# Patient Record
Sex: Female | Born: 1950 | Race: White | Hispanic: No | Marital: Single | Smoking: Former smoker
Health system: Southern US, Community
[De-identification: ages and names within clinical notes are randomized; demographics above are authoritative.]

## PROBLEM LIST (undated history)

## (undated) DIAGNOSIS — J449 Chronic obstructive pulmonary disease, unspecified: Secondary | ICD-10-CM

## (undated) DIAGNOSIS — I10 Essential (primary) hypertension: Secondary | ICD-10-CM

## (undated) DIAGNOSIS — I2510 Atherosclerotic heart disease of native coronary artery without angina pectoris: Secondary | ICD-10-CM

## (undated) HISTORY — PX: CORONARY ANGIOPLASTY WITH STENT PLACEMENT: SHX49

---

## 2016-05-31 ENCOUNTER — Emergency Department (HOSPITAL_BASED_OUTPATIENT_CLINIC_OR_DEPARTMENT_OTHER)
Admission: EM | Admit: 2016-05-31 | Discharge: 2016-05-31 | Disposition: A | Discharge status: Other Acute Inpatient Hospital | Payer: Medicare Other | Attending: Emergency Medicine | Admitting: Emergency Medicine

## 2016-05-31 ENCOUNTER — Emergency Department (HOSPITAL_BASED_OUTPATIENT_CLINIC_OR_DEPARTMENT_OTHER): Payer: Medicare Other

## 2016-05-31 ENCOUNTER — Encounter (HOSPITAL_BASED_OUTPATIENT_CLINIC_OR_DEPARTMENT_OTHER): Payer: Self-pay | Admitting: Emergency Medicine

## 2016-05-31 DIAGNOSIS — Z79899 Other long term (current) drug therapy: Secondary | ICD-10-CM | POA: Insufficient documentation

## 2016-05-31 DIAGNOSIS — R0602 Shortness of breath: Secondary | ICD-10-CM | POA: Diagnosis present

## 2016-05-31 DIAGNOSIS — I2510 Atherosclerotic heart disease of native coronary artery without angina pectoris: Secondary | ICD-10-CM | POA: Diagnosis not present

## 2016-05-31 DIAGNOSIS — F17210 Nicotine dependence, cigarettes, uncomplicated: Secondary | ICD-10-CM | POA: Diagnosis not present

## 2016-05-31 DIAGNOSIS — J441 Chronic obstructive pulmonary disease with (acute) exacerbation: Secondary | ICD-10-CM

## 2016-05-31 DIAGNOSIS — I10 Essential (primary) hypertension: Secondary | ICD-10-CM | POA: Insufficient documentation

## 2016-05-31 HISTORY — DX: Chronic obstructive pulmonary disease, unspecified: J44.9

## 2016-05-31 HISTORY — DX: Essential (primary) hypertension: I10

## 2016-05-31 HISTORY — DX: Atherosclerotic heart disease of native coronary artery without angina pectoris: I25.10

## 2016-05-31 LAB — BASIC METABOLIC PANEL
ANION GAP: 9 (ref 5–15)
BUN: 19 mg/dL (ref 6–20)
CALCIUM: 9.6 mg/dL (ref 8.9–10.3)
CO2: 27 mmol/L (ref 22–32)
Chloride: 103 mmol/L (ref 101–111)
Creatinine, Ser: 0.68 mg/dL (ref 0.44–1.00)
Glucose, Bld: 132 mg/dL — ABNORMAL HIGH (ref 65–99)
POTASSIUM: 3.6 mmol/L (ref 3.5–5.1)
SODIUM: 139 mmol/L (ref 135–145)

## 2016-05-31 LAB — CBC WITH DIFFERENTIAL/PLATELET
Basophils Absolute: 0 10*3/uL (ref 0.0–0.1)
Basophils Relative: 0 %
Eosinophils Absolute: 0.1 10*3/uL (ref 0.0–0.7)
Eosinophils Relative: 1 %
HCT: 46.7 % — ABNORMAL HIGH (ref 36.0–46.0)
Hemoglobin: 16.1 g/dL — ABNORMAL HIGH (ref 12.0–15.0)
Lymphocytes Relative: 20 %
Lymphs Abs: 2 10*3/uL (ref 0.7–4.0)
MCH: 30.8 pg (ref 26.0–34.0)
MCHC: 34.5 g/dL (ref 30.0–36.0)
MCV: 89.3 fL (ref 78.0–100.0)
Monocytes Absolute: 0.7 10*3/uL (ref 0.1–1.0)
Monocytes Relative: 7 %
Neutro Abs: 7.3 10*3/uL (ref 1.7–7.7)
Neutrophils Relative %: 72 %
Platelets: 262 10*3/uL (ref 150–400)
RBC: 5.23 MIL/uL — ABNORMAL HIGH (ref 3.87–5.11)
RDW: 12.7 % (ref 11.5–15.5)
WBC: 10 10*3/uL (ref 4.0–10.5)

## 2016-05-31 MED ORDER — ALBUTEROL (5 MG/ML) CONTINUOUS INHALATION SOLN
10.0000 mg/h | INHALATION_SOLUTION | RESPIRATORY_TRACT | Status: DC
  Administered 2016-05-31: 10 mg/h via RESPIRATORY_TRACT
  Filled 2016-05-31: qty 20

## 2016-05-31 MED ORDER — METHYLPREDNISOLONE SODIUM SUCC 125 MG IJ SOLR
125.0000 mg | Freq: Once | INTRAMUSCULAR | Status: AC
  Administered 2016-05-31: 125 mg via INTRAVENOUS
  Filled 2016-05-31: qty 2

## 2016-05-31 MED ORDER — ALBUTEROL SULFATE (2.5 MG/3ML) 0.083% IN NEBU
INHALATION_SOLUTION | RESPIRATORY_TRACT | Status: AC
  Administered 2016-05-31: 5 mg
  Filled 2016-05-31: qty 6

## 2016-05-31 MED ORDER — IPRATROPIUM-ALBUTEROL 0.5-2.5 (3) MG/3ML IN SOLN
3.0000 mL | Freq: Four times a day (QID) | RESPIRATORY_TRACT | Status: DC
  Administered 2016-05-31: 3 mL via RESPIRATORY_TRACT
  Filled 2016-05-31: qty 3

## 2016-05-31 NOTE — ED Notes (Signed)
One hour breathing tx per resp

## 2016-05-31 NOTE — ED Provider Notes (Signed)
[facility]   CSN: [id number] Arrival date & time: 05/31/16  1408   By signing my name below, I, Clarisse GougeXavier Herndon, attest that this documentation has been prepared under the direction and in the presence of Raeford RazorKohut, Tiki Tucciarone, MD. Electronically signed, Clarisse GougeXavier Herndon, ED Scribe. 05/31/16. 3:24 PM.   History   Chief Complaint Chief Complaint  Patient presents with  . Shortness of Breath   The history is provided by the patient and medical records. No language interpreter was used.  Shortness of Breath  Pertinent negatives include no fever, no cough, no chest pain, no syncope and no leg swelling. It is unknown what precipitated the problem. Treatments tried: albuterol. The treatment provided no relief. Associated medical issues include COPD.    Kristy Weeks is a 66 y.o. female with h/o COPD, CAD and HTN, who presents to the Emergency Department with concern for gradually worsening SOB x 4 days. Feels achy all over. Feels somewhat anxious. Pt diagnosed with COPD ~09/2015. States she has used multiple medications including albuterol without relief. Pt last prescribed prednisone 09/2015. No cough, foot swelling, fever or any other complaints noted at this time.  Past Medical History:  Diagnosis Date  . COPD (chronic obstructive pulmonary disease) (HCC)   . Coronary artery disease   . Hypertension     There are no active problems to display for this patient.   Past Surgical History:  Procedure Laterality Date  . CORONARY ANGIOPLASTY WITH STENT PLACEMENT      OB History    No data available       Home Medications    Prior to Admission medications   Medication Sig Start Date End Date Taking? Authorizing Provider  albuterol (ACCUNEB) 0.63 MG/3ML nebulizer solution Take 1 ampule by nebulization every 6 (six) hours as needed for wheezing.   Yes [provider]  lisinopril (PRINIVIL,ZESTRIL) 10 MG tablet Take 10 mg by mouth 2 (two) times daily.   Yes  [provider]  tiotropium (SPIRIVA) 18 MCG inhalation capsule Place 18 mcg into inhaler and inhale daily.   Yes [provider]    Family History No family history on file.  Social History Social History  Substance Use Topics  . Smoking status: Current Every Day Smoker    Packs/day: 0.50    Types: Cigarettes  . Smokeless tobacco: Never Used  . Alcohol use No     Allergies   Patient has no known allergies.   Review of Systems Review of Systems  Constitutional: Negative for fever.  Respiratory: Positive for chest tightness and shortness of breath. Negative for cough.   Cardiovascular: Negative for chest pain, leg swelling and syncope.  Musculoskeletal: Positive for arthralgias and myalgias.  Psychiatric/Behavioral: The patient is nervous/anxious.   All other systems reviewed and are negative.    Physical Exam Updated Vital Signs BP (!) 136/102 (BP Location: Left Arm)   Pulse 98   Temp 97.5 F (36.4 C) (Oral)   Resp (!) 26   SpO2 92%   Physical Exam  Constitutional: She is oriented to person, place, and time. She appears well-developed and well-nourished. No distress.  HENT:  Head: Normocephalic and atraumatic.  Eyes: EOM are normal.  Neck: Normal range of motion.  Cardiovascular: Regular rhythm and normal heart sounds.  Tachycardia present.   Pulmonary/Chest: Effort normal. Tachypnea noted. She has wheezes.  Abdominal: Soft. She exhibits no distension. There is no tenderness.  Musculoskeletal: Normal range of motion.  No lower extremity edema  Neurological: She is alert and oriented to person, place, and time.  Skin: Skin is warm and dry.  Psychiatric: She has a normal mood and affect. Judgment normal.  Nursing note and vitals reviewed.    ED Treatments / Results  DIAGNOSTIC STUDIES: Oxygen Saturation is 92% on NRB, low by my interpretation.    COORDINATION OF CARE: 3:13 PM-Discussed next steps with pt. Pt verbalized understanding and  is agreeable with the plan. Will order IV medications, review records and reassess.   Labs (all labs ordered are listed, but only abnormal results are displayed) Labs Reviewed  CBC WITH DIFFERENTIAL/PLATELET - Abnormal; Notable for the following:       Result Value   RBC 5.23 (*)    Hemoglobin 16.1 (*)    HCT 46.7 (*)    All other components within normal limits  BASIC METABOLIC PANEL    EKG  EKG Interpretation None       Radiology Dg Chest 2 View  Result Date: 05/31/2016 CLINICAL DATA:  COPD exacerbation.  Shortness of breath. EXAM: CHEST  2 VIEW COMPARISON:  10/18/2015 FINDINGS: The heart size and mediastinal contours are within normal limits. Lungs are hyperinflated. Advanced changes of emphysema identified. Chronic mass-like architectural distortion within the lateral right apex is similar to previous exam. Likely scar. The visualized skeletal structures are unremarkable. IMPRESSION: 1. No acute cardiopulmonary abnormalities. 2.  Emphysema (ICD10-J43.9). Electronically Signed   By: Signa Kell M.D.   On: 05/31/2016 15:11    Procedures Procedures (including critical care time)  Medications Ordered in ED Medications  albuterol (PROVENTIL,VENTOLIN) solution continuous neb (10 mg/hr Nebulization New Bag/Given 05/31/16 1503)  albuterol (PROVENTIL) (2.5 MG/3ML) 0.083% nebulizer solution (5 mg  Given 05/31/16 1426)  methylPREDNISolone sodium succinate (SOLU-MEDROL) 125 mg/2 mL injection 125 mg (125 mg Intravenous Given 05/31/16 1608)     Initial Impression / Assessment and Plan / ED Course  I have reviewed the triage vital signs and the nursing notes.  Pertinent labs & imaging results that were available during my care of the patient were reviewed by me and considered in my medical decision making (see chart for details).  65yF with dyspnea. Clinically COPD exacerbation. Hx of the same. Wheezing on exam. No orthopnea. Clinically euvolemic. Denies CP. No signs/symptoms of DVT/PE.  CXR w/o focal abnormality. Afebrile. No leukocytosis.   Given 125mg  solumderol. Albuterol/atrovent including hour long neb. Still with RR 26-28. Requiring 3L via [place] to keep o2 sats in low 90s. No baseline oxygen requirement. Will admit. Pt requesting admission at [facility]. Discussed with Dr Georgina Snell,  hospitalist service.   Final Clinical Impressions(s) / ED Diagnoses   Final diagnoses:  COPD exacerbation (HCC)    New Prescriptions New Prescriptions   No medications on file    I personally preformed the services scribed in my presence. The recorded information has been reviewed is accurate. Raeford Razor, MD.    Raeford Razor, MD 05/31/16 [phone]

## 2016-05-31 NOTE — ED Notes (Signed)
[facility] Hospitalist paged @ [phone].

## 2016-05-31 NOTE — ED Notes (Signed)
Patient seen in triage and given 5 mg Albuterol. When she arrived to room, still very SOB and sitting straight up to breathe. Spoke with MD about starting a 10mg  CAT. I did not give atrovent because she had spiriva this AM. RT to monitor as needed.

## 2016-05-31 NOTE — ED Notes (Signed)
No changes, preparing to leave with [facility], alert, NAD, calm, breathing easier.

## 2016-05-31 NOTE — ED Notes (Signed)
Up to b/r, tolerating, steady gait.

## 2016-05-31 NOTE — ED Triage Notes (Signed)
SOB with cough since Tuesday

## 2016-05-31 NOTE — ED Notes (Signed)
CAT done at this time. SAT 88% on RA so I placed on 3 LNC. RN at bedside starting IV

## 2016-05-31 NOTE — ED Notes (Signed)
Alert, NAD, calm, interactive, increased wob noted, speaking in clear short phrases, skin W&D, VSS, mentions some sob, "feel better", a little light-headed, (denies: pain, nausea, dizziness or visual changes), pending transport arrival.

## 2016-05-31 NOTE — ED Notes (Signed)
Pt going to [facility].  Admitting Dr Renford DillsAdhikari.  [facility] notified and stated that they would be here after shift change.  [facility] not available. 1824

## 2016-05-31 NOTE — ED Notes (Signed)
insp and exp wheezies

## 2017-02-20 ENCOUNTER — Other Ambulatory Visit: Payer: Self-pay

## 2017-02-20 ENCOUNTER — Encounter (HOSPITAL_BASED_OUTPATIENT_CLINIC_OR_DEPARTMENT_OTHER): Payer: Self-pay | Admitting: Adult Health

## 2017-02-20 ENCOUNTER — Emergency Department (HOSPITAL_BASED_OUTPATIENT_CLINIC_OR_DEPARTMENT_OTHER): Payer: Medicare Other

## 2017-02-20 ENCOUNTER — Emergency Department (HOSPITAL_BASED_OUTPATIENT_CLINIC_OR_DEPARTMENT_OTHER)
Admission: EM | Admit: 2017-02-20 | Discharge: 2017-02-20 | Disposition: A | Discharge status: Home / Self Care | Payer: Medicare Other | Attending: Emergency Medicine | Admitting: Emergency Medicine

## 2017-02-20 DIAGNOSIS — F17210 Nicotine dependence, cigarettes, uncomplicated: Secondary | ICD-10-CM | POA: Diagnosis not present

## 2017-02-20 DIAGNOSIS — Z79899 Other long term (current) drug therapy: Secondary | ICD-10-CM | POA: Insufficient documentation

## 2017-02-20 DIAGNOSIS — I10 Essential (primary) hypertension: Secondary | ICD-10-CM | POA: Insufficient documentation

## 2017-02-20 DIAGNOSIS — J441 Chronic obstructive pulmonary disease with (acute) exacerbation: Secondary | ICD-10-CM | POA: Diagnosis not present

## 2017-02-20 DIAGNOSIS — R0602 Shortness of breath: Secondary | ICD-10-CM | POA: Diagnosis present

## 2017-02-20 DIAGNOSIS — I2510 Atherosclerotic heart disease of native coronary artery without angina pectoris: Secondary | ICD-10-CM | POA: Diagnosis not present

## 2017-02-20 LAB — BASIC METABOLIC PANEL
Anion gap: 9 (ref 5–15)
BUN: 12 mg/dL (ref 6–20)
CALCIUM: 9.4 mg/dL (ref 8.9–10.3)
CHLORIDE: 102 mmol/L (ref 101–111)
CO2: 26 mmol/L (ref 22–32)
CREATININE: 0.93 mg/dL (ref 0.44–1.00)
GFR calc non Af Amer: >60 mL/min (ref >60)
Glucose, Bld: 121 mg/dL — ABNORMAL HIGH (ref 65–99)
Potassium: 4.3 mmol/L (ref 3.5–5.1)
SODIUM: 137 mmol/L (ref 135–145)

## 2017-02-20 LAB — CBC
HCT: 40.5 % (ref 36.0–46.0)
Hemoglobin: 13.8 g/dL (ref 12.0–15.0)
MCH: 31.4 pg (ref 26.0–34.0)
MCHC: 34.1 g/dL (ref 30.0–36.0)
MCV: 92.3 fL (ref 78.0–100.0)
PLATELETS: 247 10*3/uL (ref 150–400)
RBC: 4.39 MIL/uL (ref 3.87–5.11)
RDW: 12.4 % (ref 11.5–15.5)
WBC: 9.3 10*3/uL (ref 4.0–10.5)

## 2017-02-20 LAB — BRAIN NATRIURETIC PEPTIDE: B NATRIURETIC PEPTIDE 5: 72.4 pg/mL (ref 0.0–100.0)

## 2017-02-20 LAB — TROPONIN I

## 2017-02-20 MED ORDER — IPRATROPIUM-ALBUTEROL 0.5-2.5 (3) MG/3ML IN SOLN
3.0000 mL | Freq: Once | RESPIRATORY_TRACT | Status: AC
  Administered 2017-02-20: 3 mL via RESPIRATORY_TRACT
  Filled 2017-02-20: qty 3

## 2017-02-20 MED ORDER — ALBUTEROL SULFATE (2.5 MG/3ML) 0.083% IN NEBU
2.5000 mg | INHALATION_SOLUTION | Freq: Once | RESPIRATORY_TRACT | Status: AC
  Administered 2017-02-20: 2.5 mg via RESPIRATORY_TRACT
  Filled 2017-02-20: qty 3

## 2017-02-20 MED ORDER — IPRATROPIUM-ALBUTEROL 0.5-2.5 (3) MG/3ML IN SOLN
3.0000 mL | Freq: Once | RESPIRATORY_TRACT | Status: AC
  Administered 2017-02-20: 3 mL via RESPIRATORY_TRACT

## 2017-02-20 MED ORDER — ALBUTEROL SULFATE (2.5 MG/3ML) 0.083% IN NEBU
INHALATION_SOLUTION | RESPIRATORY_TRACT | Status: AC
  Administered 2017-02-20: 2.5 mg via RESPIRATORY_TRACT
  Filled 2017-02-20: qty 3

## 2017-02-20 MED ORDER — ALBUTEROL SULFATE (2.5 MG/3ML) 0.083% IN NEBU
2.5000 mg | INHALATION_SOLUTION | Freq: Once | RESPIRATORY_TRACT | Status: AC
  Administered 2017-02-20: 2.5 mg via RESPIRATORY_TRACT

## 2017-02-20 MED ORDER — PREDNISONE 50 MG PO TABS
60.0000 mg | ORAL_TABLET | Freq: Once | ORAL | Status: AC
  Administered 2017-02-20: 16:00:00 60 mg via ORAL
  Filled 2017-02-20: qty 1

## 2017-02-20 MED ORDER — IPRATROPIUM-ALBUTEROL 0.5-2.5 (3) MG/3ML IN SOLN
RESPIRATORY_TRACT | Status: AC
  Administered 2017-02-20: 3 mL via RESPIRATORY_TRACT
  Filled 2017-02-20: qty 3

## 2017-02-20 MED ORDER — PREDNISONE 20 MG PO TABS: 40.0000 mg | ORAL_TABLET | Freq: Every day | ORAL | 0 refills | Status: DC

## 2017-02-20 NOTE — ED Notes (Signed)
Pt discharged to home with family. NAD. Steady gait . Pt wearing 2 L [place] from portable home tank.

## 2017-02-20 NOTE — ED Notes (Signed)
RRT Brett CanalesSteve ambulated Pt

## 2017-02-20 NOTE — Discharge Instructions (Signed)
Please take prednisone for the next 5 days Continue breathing treatments Return if worsening

## 2017-02-20 NOTE — ED Notes (Signed)
Up to restroom, SpO2 87% on 2l/m [place], purse lip breathing noted. RR 26

## 2017-02-20 NOTE — ED Notes (Signed)
Ambulated on 2l/m , HR 90-97, RR 20-26, purse lip breathing, +DOE, SpO2 85% at lowest.  Returned to room SpO2 recovered to 92% on 2l/m.  BBS distant.

## 2017-02-20 NOTE — ED Triage Notes (Signed)
Presents with SOB, lower back pain, fatigue and chest tightness. She is O2 dependent at 2 liters, but had to turmn it up to 4 liters. She completed Pulmonary  Rehab one week ago.  Bila;teral inspiratory and expiratory audible wheezes/

## 2017-02-20 NOTE — ED Provider Notes (Signed)
[facility] EMERGENCY DEPARTMENT Provider Note   CSN: [id number] Arrival date & time: 02/20/17  1518     History   Chief Complaint Chief Complaint  Patient presents with  . Shortness of Breath    HPI Kristy Weeks is a 67 y.o. female who presents with SOB and wheezing. PMH significant for COPD on 2L of O2 24/7, CAD, HTN, current smoker. She states that she recently completed pulmonary rehab. She did very well and graduated. Over the weekend she was able to go shopping with her sister without any difficulty. Over the last couple of days she has had gradually worsening SOB with exertion. She also reports low back pain and attributes this to her increased WOB vs osteopenia. She denies injury to the back. She has been using 4L of O2 because she's been SOB. She denies fever, URI symptoms, cough, chest pain, diaphoresis, abdominal pain, nausea or vomiting, leg swelling. She has continued to smoke several cigarettes daily.  HPI  Past Medical History:  Diagnosis Date  . COPD (chronic obstructive pulmonary disease) (HCC)   . Coronary artery disease   . Hypertension     There are no active problems to display for this patient.   Past Surgical History:  Procedure Laterality Date  . CORONARY ANGIOPLASTY WITH STENT PLACEMENT      OB History    No data available       Home Medications    Prior to Admission medications   Medication Sig Start Date End Date Taking? Authorizing Provider  albuterol (ACCUNEB) 0.63 MG/3ML nebulizer solution Take 1 ampule by nebulization every 6 (six) hours as needed for wheezing.    [provider]  lisinopril (PRINIVIL,ZESTRIL) 10 MG tablet Take 10 mg by mouth 2 (two) times daily.    [provider]  tiotropium (SPIRIVA) 18 MCG inhalation capsule Place 18 mcg into inhaler and inhale daily.    [provider]    Family History History reviewed. No pertinent family history.  Social History Social History    Tobacco Use  . Smoking status: Current Every Day Smoker    Packs/day: 0.50    Types: Cigarettes  . Smokeless tobacco: Never Used  Substance Use Topics  . Alcohol use: No  . Drug use: No     Allergies   Patient has no known allergies.   Review of Systems Review of Systems  Constitutional: Negative for chills and fever.  HENT: Negative for congestion and rhinorrhea.   Respiratory: Positive for chest tightness, shortness of breath and wheezing. Negative for cough.   Cardiovascular: Negative for chest pain and leg swelling.  Gastrointestinal: Negative for abdominal pain, nausea and vomiting.  Musculoskeletal: Positive for back pain.  Neurological: Negative for weakness.  All other systems reviewed and are negative.    Physical Exam Updated Vital Signs BP 114/71   Pulse 85   Temp 98.1 F (36.7 C) (Oral)   Resp 18   SpO2 93%   Physical Exam  Constitutional: She is oriented to person, place, and time. She appears well-developed and well-nourished. No distress.  Chronically ill appearing  HENT:  Head: Normocephalic and atraumatic.  Eyes: Conjunctivae are normal. Pupils are equal, round, and reactive to light. Right eye exhibits no discharge. Left eye exhibits no discharge. No scleral icterus.  Neck: Normal range of motion.  Cardiovascular: Normal rate and regular rhythm. Exam reveals no gallop and no friction rub.  No murmur heard. Pulmonary/Chest: Effort normal. No stridor. No respiratory distress. She has  wheezes (faint expiratory wheezes). She has no rales. She exhibits no tenderness.  Abdominal: Soft. Bowel sounds are normal. She exhibits no distension. There is no tenderness.  Musculoskeletal:  No leg swelling  Neurological: She is alert and oriented to person, place, and time.  Skin: Skin is warm and dry.  Psychiatric: She has a normal mood and affect. Her behavior is normal.  Nursing note and vitals reviewed.    ED Treatments / Results  Labs (all labs  ordered are listed, but only abnormal results are displayed) Labs Reviewed  BASIC METABOLIC PANEL - Abnormal; Notable for the following components:      Result Value   Glucose, Bld 121 (*)    All other components within normal limits  CBC  TROPONIN I  BRAIN NATRIURETIC PEPTIDE    EKG  EKG Interpretation  Date/Time:  Friday February 20 2017 15:27:51 EST Ventricular Rate:  91 PR Interval:  154 QRS Duration: 96 QT Interval:  382 QTC Calculation: 469 R Axis:   78 Text Interpretation:  Sinus rhythm with Premature atrial complexes Cannot rule out Anterior infarct , age undetermined Marked ST abnormality, possible inferior subendocardial injury Abnormal ECG agree. no STEMI. no old comparison Confirmed by Arby Barrette [phone]) on 02/20/2017 3:46:49 PM       Radiology Dg Chest 2 View  Result Date: 02/20/2017 CLINICAL DATA:  Chest tightness, weakness EXAM: CHEST  2 VIEW COMPARISON:  05/31/2016 FINDINGS: The lungs are hyperinflated likely secondary to COPD. There is right apical scarring. There is no focal parenchymal opacity. There is no pleural effusion or pneumothorax. The heart and mediastinal contours are unremarkable. The osseous structures are unremarkable. IMPRESSION: No active cardiopulmonary disease. Electronically Signed   By: Elige Ko   On: 02/20/2017 16:04    Procedures Procedures (including critical care time)  Medications Ordered in ED Medications  predniSONE (DELTASONE) tablet 60 mg (60 mg Oral Given 02/20/17 1626)  ipratropium-albuterol (DUONEB) 0.5-2.5 (3) MG/3ML nebulizer solution 3 mL (3 mLs Nebulization Given 02/20/17 1742)  albuterol (PROVENTIL) (2.5 MG/3ML) 0.083% nebulizer solution 2.5 mg (2.5 mg Nebulization Given 02/20/17 1742)  albuterol (PROVENTIL) (2.5 MG/3ML) 0.083% nebulizer solution 2.5 mg (2.5 mg Nebulization Given 02/20/17 1808)  ipratropium-albuterol (DUONEB) 0.5-2.5 (3) MG/3ML nebulizer solution 3 mL (3 mLs Nebulization Given 02/20/17 1808)  albuterol  (PROVENTIL) (2.5 MG/3ML) 0.083% nebulizer solution 2.5 mg (2.5 mg Nebulization Not Given 02/20/17 1901)  ipratropium-albuterol (DUONEB) 0.5-2.5 (3) MG/3ML nebulizer solution 3 mL (3 mLs Nebulization Not Given 02/20/17 1901)     Initial Impression / Assessment and Plan / ED Course  I have reviewed the triage vital signs and the nursing notes.  Pertinent labs & imaging results that were available during my care of the patient were reviewed by me and considered in my medical decision making (see chart for details).  67 year old with COPD exacerbation.  Likely due to her ongoing smoking.  She is mildly tachypneic on exam however her lungs sound clear.  O2 sat are normal on 2 L.  Blood work is overall unremarkable.  EKG is normal sinus rhythm.  Chest x-ray shows chronic changes without acute pathology.  She was given multiple breathing treatments and steroids and felt somewhat better.  She was ambulated in the hall on her O2 sats dropped to 85%.  She had visit with Dr. Clarice Pole.  I shared decision making was made with the patient.  Admission was offered however she declined stating she wants to go home and will do treatments at home.  Will prescribe a steroid burst for the next couple of days and advised to return if worsening.  She verbalized understanding.  Final Clinical Impressions(s) / ED Diagnoses   Final diagnoses:  COPD exacerbation [facility](HCC)    ED Discharge Orders    None       Bethel BornGekas, Wally Behan Marie, PA-C 02/21/17 [phone]    Arby BarrettePfeiffer, Marcy, MD 02/24/17 1759

## 2017-02-20 NOTE — ED Provider Notes (Signed)
Medical screening examination/treatment/procedure(s) were conducted as a shared visit with non-physician practitioner(s) and myself.  I personally evaluated the patient during the encounter.   EKG Interpretation  Date/Time:  Friday February 20 2017 15:27:51 EST Ventricular Rate:  91 PR Interval:  154 QRS Duration: 96 QT Interval:  382 QTC Calculation: 469 R Axis:   78 Text Interpretation:  Sinus rhythm with Premature atrial complexes Cannot rule out Anterior infarct , age undetermined Marked ST abnormality, possible inferior subendocardial injury Abnormal ECG agree. no STEMI. no old comparison Confirmed by Arby BarrettePfeiffer, Kaleb Sek [phone]([id number]) on 02/20/2017 3:46:49 PM     Patient has known COPD.  She reports that she has been getting increasingly short of breath for several days.  Has been some increased coughing.  Her chest is felt tight.  No fever.  Patient reports he successfully completed pulmonary rehab for winter and was doing quite well.  She reports since then though she has decreased her activity level and is occasionally smoking a cigarette.  Patient has had 2 treatments prior to my examination.  She is alert and appropriate she is speaking in full sentences.  Mild increased work of breathing at rest.  Breath sounds are soft but airflow to the bases.  Occasional fine expiratory wheeze.  Diagnostic workup within normal limits.  Patient did desaturate to about 86% ambulating on 2 L.  Will administer another DuoNeb and reassess determine if patient will need admission versus home management on prednisone taper and nebulizer therapy every 4-6 hours.   Arby BarrettePfeiffer, Kris No, MD 02/20/17 1806

## 2018-01-25 ENCOUNTER — Emergency Department (HOSPITAL_BASED_OUTPATIENT_CLINIC_OR_DEPARTMENT_OTHER): Payer: Medicare Other

## 2018-01-25 ENCOUNTER — Encounter (HOSPITAL_BASED_OUTPATIENT_CLINIC_OR_DEPARTMENT_OTHER): Payer: Self-pay | Admitting: Emergency Medicine

## 2018-01-25 ENCOUNTER — Inpatient Hospital Stay (HOSPITAL_BASED_OUTPATIENT_CLINIC_OR_DEPARTMENT_OTHER)
Admission: EM | Admit: 2018-01-25 | Discharge: 2018-01-28 | DRG: 190 | Disposition: A | Discharge status: Home / Self Care | Payer: Medicare Other | Attending: Internal Medicine | Admitting: Internal Medicine

## 2018-01-25 ENCOUNTER — Other Ambulatory Visit: Payer: Self-pay

## 2018-01-25 DIAGNOSIS — J209 Acute bronchitis, unspecified: Secondary | ICD-10-CM | POA: Diagnosis present

## 2018-01-25 DIAGNOSIS — Z87891 Personal history of nicotine dependence: Secondary | ICD-10-CM

## 2018-01-25 DIAGNOSIS — J189 Pneumonia, unspecified organism: Secondary | ICD-10-CM

## 2018-01-25 DIAGNOSIS — Z7902 Long term (current) use of antithrombotics/antiplatelets: Secondary | ICD-10-CM

## 2018-01-25 DIAGNOSIS — Z7982 Long term (current) use of aspirin: Secondary | ICD-10-CM

## 2018-01-25 DIAGNOSIS — Z79899 Other long term (current) drug therapy: Secondary | ICD-10-CM

## 2018-01-25 DIAGNOSIS — I2510 Atherosclerotic heart disease of native coronary artery without angina pectoris: Secondary | ICD-10-CM | POA: Diagnosis present

## 2018-01-25 DIAGNOSIS — M545 Low back pain: Secondary | ICD-10-CM | POA: Diagnosis present

## 2018-01-25 DIAGNOSIS — G8929 Other chronic pain: Secondary | ICD-10-CM | POA: Diagnosis present

## 2018-01-25 DIAGNOSIS — J069 Acute upper respiratory infection, unspecified: Secondary | ICD-10-CM | POA: Diagnosis present

## 2018-01-25 DIAGNOSIS — Z955 Presence of coronary angioplasty implant and graft: Secondary | ICD-10-CM

## 2018-01-25 DIAGNOSIS — J441 Chronic obstructive pulmonary disease with (acute) exacerbation: Principal | ICD-10-CM | POA: Diagnosis present

## 2018-01-25 DIAGNOSIS — Z9981 Dependence on supplemental oxygen: Secondary | ICD-10-CM

## 2018-01-25 DIAGNOSIS — I10 Essential (primary) hypertension: Secondary | ICD-10-CM | POA: Diagnosis present

## 2018-01-25 DIAGNOSIS — Z7952 Long term (current) use of systemic steroids: Secondary | ICD-10-CM

## 2018-01-25 DIAGNOSIS — J440 Chronic obstructive pulmonary disease with acute lower respiratory infection: Secondary | ICD-10-CM | POA: Diagnosis present

## 2018-01-25 DIAGNOSIS — Z7951 Long term (current) use of inhaled steroids: Secondary | ICD-10-CM

## 2018-01-25 DIAGNOSIS — J449 Chronic obstructive pulmonary disease, unspecified: Secondary | ICD-10-CM | POA: Diagnosis present

## 2018-01-25 DIAGNOSIS — B974 Respiratory syncytial virus as the cause of diseases classified elsewhere: Secondary | ICD-10-CM | POA: Diagnosis present

## 2018-01-25 DIAGNOSIS — J9621 Acute and chronic respiratory failure with hypoxia: Secondary | ICD-10-CM | POA: Diagnosis present

## 2018-01-25 DIAGNOSIS — J9601 Acute respiratory failure with hypoxia: Secondary | ICD-10-CM | POA: Diagnosis present

## 2018-01-25 LAB — CBC WITH DIFFERENTIAL/PLATELET
Abs Immature Granulocytes: 0.03 10*3/uL (ref 0.00–0.07)
Basophils Absolute: 0 10*3/uL (ref 0.0–0.1)
Basophils Relative: 0 %
EOS PCT: 0 %
Eosinophils Absolute: 0 10*3/uL (ref 0.0–0.5)
HCT: 40.7 % (ref 36.0–46.0)
HEMOGLOBIN: 12.5 g/dL (ref 12.0–15.0)
Immature Granulocytes: 0 %
LYMPHS PCT: 10 %
Lymphs Abs: 1 10*3/uL (ref 0.7–4.0)
MCH: 28.9 pg (ref 26.0–34.0)
MCHC: 30.7 g/dL (ref 30.0–36.0)
MCV: 94.2 fL (ref 80.0–100.0)
Monocytes Absolute: 1 10*3/uL (ref 0.1–1.0)
Monocytes Relative: 10 %
Neutro Abs: 7.7 10*3/uL (ref 1.7–7.7)
Neutrophils Relative %: 80 %
Platelets: 245 10*3/uL (ref 150–400)
RBC: 4.32 MIL/uL (ref 3.87–5.11)
RDW: 12.8 % (ref 11.5–15.5)
WBC: 9.7 10*3/uL (ref 4.0–10.5)
nRBC: 0 % (ref 0.0–0.2)

## 2018-01-25 LAB — COMPREHENSIVE METABOLIC PANEL
ALT: 18 U/L (ref 0–44)
AST: 21 U/L (ref 15–41)
Albumin: 4.2 g/dL (ref 3.5–5.0)
Alkaline Phosphatase: 79 U/L (ref 38–126)
Anion gap: 7 (ref 5–15)
BUN: 9 mg/dL (ref 8–23)
CALCIUM: 9.3 mg/dL (ref 8.9–10.3)
CO2: 30 mmol/L (ref 22–32)
Chloride: 99 mmol/L (ref 98–111)
Creatinine, Ser: 0.68 mg/dL (ref 0.44–1.00)
GFR calc Af Amer: >60 mL/min (ref >60)
GFR calc non Af Amer: >60 mL/min (ref >60)
Glucose, Bld: 173 mg/dL — ABNORMAL HIGH (ref 70–99)
Potassium: 3.9 mmol/L (ref 3.5–5.1)
Sodium: 136 mmol/L (ref 135–145)
Total Bilirubin: 0.7 mg/dL (ref 0.3–1.2)
Total Protein: 7.6 g/dL (ref 6.5–8.1)

## 2018-01-25 LAB — URINALYSIS, ROUTINE W REFLEX MICROSCOPIC
Bilirubin Urine: NEGATIVE
Glucose, UA: 250 mg/dL — AB
Ketones, ur: NEGATIVE mg/dL
LEUKOCYTES UA: NEGATIVE
Nitrite: NEGATIVE
PH: 6 (ref 5.0–8.0)
PROTEIN: NEGATIVE mg/dL
Specific Gravity, Urine: 1.02 (ref 1.005–1.030)

## 2018-01-25 LAB — I-STAT VENOUS BLOOD GAS, ED
Acid-Base Excess: 4 mmol/L — ABNORMAL HIGH (ref 0.0–2.0)
Bicarbonate: 31.8 mmol/L — ABNORMAL HIGH (ref 20.0–28.0)
O2 Saturation: 89 %
Patient temperature: 97.2
TCO2: 34 mmol/L — ABNORMAL HIGH (ref 22–32)
pCO2, Ven: 56.4 mmHg (ref 44.0–60.0)
pH, Ven: 7.355 (ref 7.250–7.430)
pO2, Ven: 57 mmHg — ABNORMAL HIGH (ref 32.0–45.0)

## 2018-01-25 LAB — PROTIME-INR
INR: 0.91
Prothrombin Time: 12.2 seconds (ref 11.4–15.2)

## 2018-01-25 LAB — URINALYSIS, MICROSCOPIC (REFLEX)

## 2018-01-25 LAB — I-STAT CG4 LACTIC ACID, ED: Lactic Acid, Venous: 1.11 mmol/L (ref 0.5–1.9)

## 2018-01-25 MED ORDER — LEVOFLOXACIN 500 MG PO TABS
500.0000 mg | ORAL_TABLET | Freq: Once | ORAL | Status: AC
  Administered 2018-01-25: 500 mg via ORAL
  Filled 2018-01-25: qty 1

## 2018-01-25 MED ORDER — SODIUM CHLORIDE 0.9 % IV SOLN
1.0000 g | Freq: Once | INTRAVENOUS | Status: AC
  Administered 2018-01-25: 1 g via INTRAVENOUS
  Filled 2018-01-25: qty 10

## 2018-01-25 MED ORDER — ACETAMINOPHEN 325 MG PO TABS
650.0000 mg | ORAL_TABLET | Freq: Once | ORAL | Status: AC
  Administered 2018-01-25: 650 mg via ORAL
  Filled 2018-01-25: qty 2

## 2018-01-25 MED ORDER — METHYLPREDNISOLONE SODIUM SUCC 125 MG IJ SOLR
125.0000 mg | Freq: Once | INTRAMUSCULAR | Status: AC
  Administered 2018-01-25: 125 mg via INTRAVENOUS
  Filled 2018-01-25: qty 2

## 2018-01-25 MED ORDER — SODIUM CHLORIDE 0.9 % IV SOLN
INTRAVENOUS | Status: DC | PRN
  Administered 2018-01-25: 500 mL via INTRAVENOUS

## 2018-01-25 MED ORDER — ALBUTEROL SULFATE (2.5 MG/3ML) 0.083% IN NEBU
INHALATION_SOLUTION | RESPIRATORY_TRACT | Status: AC
  Administered 2018-01-25: 2.5 mg via RESPIRATORY_TRACT
  Filled 2018-01-25: qty 3

## 2018-01-25 MED ORDER — ALBUTEROL (5 MG/ML) CONTINUOUS INHALATION SOLN
15.0000 mg/h | INHALATION_SOLUTION | RESPIRATORY_TRACT | Status: AC
  Administered 2018-01-25: 15 mg/h via RESPIRATORY_TRACT
  Filled 2018-01-25: qty 20

## 2018-01-25 MED ORDER — IPRATROPIUM-ALBUTEROL 0.5-2.5 (3) MG/3ML IN SOLN
RESPIRATORY_TRACT | Status: AC
  Administered 2018-01-25: 3 mL via RESPIRATORY_TRACT
  Filled 2018-01-25: qty 3

## 2018-01-25 MED ORDER — IPRATROPIUM-ALBUTEROL 0.5-2.5 (3) MG/3ML IN SOLN: 3.0000 mL | Freq: Once | RESPIRATORY_TRACT | Status: AC

## 2018-01-25 MED ORDER — LISINOPRIL 10 MG PO TABS
10.0000 mg | ORAL_TABLET | Freq: Once | ORAL | Status: AC
  Administered 2018-01-25: 10 mg via ORAL
  Filled 2018-01-25: qty 1

## 2018-01-25 MED ORDER — IPRATROPIUM-ALBUTEROL 0.5-2.5 (3) MG/3ML IN SOLN
3.0000 mL | Freq: Four times a day (QID) | RESPIRATORY_TRACT | Status: DC
  Administered 2018-01-25: 3 mL via RESPIRATORY_TRACT

## 2018-01-25 MED ORDER — ALBUTEROL SULFATE (2.5 MG/3ML) 0.083% IN NEBU
2.5000 mg | INHALATION_SOLUTION | Freq: Once | RESPIRATORY_TRACT | Status: AC
  Administered 2018-01-25: 2.5 mg via RESPIRATORY_TRACT

## 2018-01-25 MED ORDER — ALBUTEROL (5 MG/ML) CONTINUOUS INHALATION SOLN
15.0000 mg/h | INHALATION_SOLUTION | RESPIRATORY_TRACT | Status: AC
  Administered 2018-01-25: 15 mg/h via RESPIRATORY_TRACT

## 2018-01-25 NOTE — Progress Notes (Signed)
Patient removed aerosol mask of CAT saying that she needed a break from the treatment.

## 2018-01-25 NOTE — Progress Notes (Signed)
Patient stated that she is ready to continue her continuous nebulizer treatment.  Patient placed back on CAT.  Patient has approximately 15 minutes to finish her 15 mg of albuterol.

## 2018-01-25 NOTE — ED Notes (Signed)
Pt states she has not had her PM medications for blood pressure

## 2018-01-25 NOTE — ED Triage Notes (Signed)
Pt having increased sob since this weekend.  Pt on oxygen at home, was 79% with O2 at [facility].  Pt states she has head cold. No known fever.

## 2018-01-26 ENCOUNTER — Encounter (HOSPITAL_COMMUNITY): Payer: Self-pay | Admitting: Internal Medicine

## 2018-01-26 DIAGNOSIS — I2510 Atherosclerotic heart disease of native coronary artery without angina pectoris: Secondary | ICD-10-CM | POA: Diagnosis present

## 2018-01-26 DIAGNOSIS — J449 Chronic obstructive pulmonary disease, unspecified: Secondary | ICD-10-CM | POA: Diagnosis present

## 2018-01-26 DIAGNOSIS — Z7952 Long term (current) use of systemic steroids: Secondary | ICD-10-CM | POA: Diagnosis not present

## 2018-01-26 DIAGNOSIS — J441 Chronic obstructive pulmonary disease with (acute) exacerbation: Secondary | ICD-10-CM | POA: Diagnosis not present

## 2018-01-26 DIAGNOSIS — I10 Essential (primary) hypertension: Secondary | ICD-10-CM | POA: Diagnosis not present

## 2018-01-26 DIAGNOSIS — J069 Acute upper respiratory infection, unspecified: Secondary | ICD-10-CM | POA: Diagnosis not present

## 2018-01-26 DIAGNOSIS — Z9981 Dependence on supplemental oxygen: Secondary | ICD-10-CM | POA: Diagnosis not present

## 2018-01-26 DIAGNOSIS — B974 Respiratory syncytial virus as the cause of diseases classified elsewhere: Secondary | ICD-10-CM | POA: Diagnosis not present

## 2018-01-26 DIAGNOSIS — Z955 Presence of coronary angioplasty implant and graft: Secondary | ICD-10-CM | POA: Diagnosis not present

## 2018-01-26 DIAGNOSIS — J209 Acute bronchitis, unspecified: Secondary | ICD-10-CM | POA: Diagnosis not present

## 2018-01-26 DIAGNOSIS — J440 Chronic obstructive pulmonary disease with acute lower respiratory infection: Secondary | ICD-10-CM | POA: Diagnosis not present

## 2018-01-26 DIAGNOSIS — M545 Low back pain: Secondary | ICD-10-CM | POA: Diagnosis not present

## 2018-01-26 DIAGNOSIS — Z79899 Other long term (current) drug therapy: Secondary | ICD-10-CM | POA: Diagnosis not present

## 2018-01-26 DIAGNOSIS — Z87891 Personal history of nicotine dependence: Secondary | ICD-10-CM | POA: Diagnosis not present

## 2018-01-26 DIAGNOSIS — Z7982 Long term (current) use of aspirin: Secondary | ICD-10-CM | POA: Diagnosis not present

## 2018-01-26 DIAGNOSIS — J9621 Acute and chronic respiratory failure with hypoxia: Secondary | ICD-10-CM

## 2018-01-26 DIAGNOSIS — G8929 Other chronic pain: Secondary | ICD-10-CM | POA: Diagnosis not present

## 2018-01-26 DIAGNOSIS — Z7951 Long term (current) use of inhaled steroids: Secondary | ICD-10-CM | POA: Diagnosis not present

## 2018-01-26 DIAGNOSIS — Z7902 Long term (current) use of antithrombotics/antiplatelets: Secondary | ICD-10-CM | POA: Diagnosis not present

## 2018-01-26 LAB — HEMOGLOBIN A1C
Hgb A1c MFr Bld: 5.4 % (ref 4.8–5.6)
Mean Plasma Glucose: 108.28 mg/dL

## 2018-01-26 LAB — RESPIRATORY PANEL BY PCR
Adenovirus: NOT DETECTED
Bordetella pertussis: NOT DETECTED
CHLAMYDOPHILA PNEUMONIAE-RVPPCR: NOT DETECTED
Coronavirus 229E: NOT DETECTED
Coronavirus HKU1: NOT DETECTED
Coronavirus NL63: NOT DETECTED
Coronavirus OC43: NOT DETECTED
Influenza A: NOT DETECTED
Influenza B: NOT DETECTED
Metapneumovirus: NOT DETECTED
Mycoplasma pneumoniae: NOT DETECTED
Parainfluenza Virus 1: NOT DETECTED
Parainfluenza Virus 2: NOT DETECTED
Parainfluenza Virus 3: NOT DETECTED
Parainfluenza Virus 4: NOT DETECTED
Respiratory Syncytial Virus: DETECTED — AB
Rhinovirus / Enterovirus: NOT DETECTED

## 2018-01-26 LAB — GLUCOSE, CAPILLARY
Glucose-Capillary: 136 mg/dL — ABNORMAL HIGH (ref 70–99)
Glucose-Capillary: 131 mg/dL — ABNORMAL HIGH (ref 70–99)
Glucose-Capillary: 129 mg/dL — ABNORMAL HIGH (ref 70–99)

## 2018-01-26 MED ORDER — ATORVASTATIN CALCIUM 40 MG PO TABS
40.0000 mg | ORAL_TABLET | Freq: Every day | ORAL | Status: DC
  Administered 2018-01-26 – 2018-01-27 (×2): 40 mg via ORAL
  Filled 2018-01-26 (×2): qty 1

## 2018-01-26 MED ORDER — PREDNISONE 20 MG PO TABS
40.0000 mg | ORAL_TABLET | Freq: Every day | ORAL | Status: DC
  Administered 2018-01-27 – 2018-01-28 (×2): 40 mg via ORAL
  Filled 2018-01-26 (×2): qty 2

## 2018-01-26 MED ORDER — KETOROLAC TROMETHAMINE 30 MG/ML IJ SOLN
30.0000 mg | Freq: Once | INTRAMUSCULAR | Status: AC
  Administered 2018-01-26: 30 mg via INTRAVENOUS
  Filled 2018-01-26: qty 1

## 2018-01-26 MED ORDER — FLUTICASONE FUROATE-VILANTEROL 100-25 MCG/INH IN AEPB
1.0000 | INHALATION_SPRAY | Freq: Every day | RESPIRATORY_TRACT | Status: DC
  Administered 2018-01-27 – 2018-01-28 (×2): 1 via RESPIRATORY_TRACT
  Filled 2018-01-26: qty 28

## 2018-01-26 MED ORDER — LISINOPRIL 10 MG PO TABS
10.0000 mg | ORAL_TABLET | Freq: Every day | ORAL | Status: DC
  Administered 2018-01-26 – 2018-01-27 (×2): 10 mg via ORAL
  Filled 2018-01-26 (×2): qty 1

## 2018-01-26 MED ORDER — IPRATROPIUM-ALBUTEROL 0.5-2.5 (3) MG/3ML IN SOLN
3.0000 mL | Freq: Four times a day (QID) | RESPIRATORY_TRACT | Status: DC
  Administered 2018-01-26 – 2018-01-27 (×4): 3 mL via RESPIRATORY_TRACT
  Filled 2018-01-26 (×4): qty 3

## 2018-01-26 MED ORDER — INSULIN ASPART 100 UNIT/ML ~~LOC~~ SOLN
0.0000 [IU] | Freq: Three times a day (TID) | SUBCUTANEOUS | Status: DC
  Administered 2018-01-26 (×2): 2 [IU] via SUBCUTANEOUS
  Administered 2018-01-27: 3 [IU] via SUBCUTANEOUS
  Administered 2018-01-28: 2 [IU] via SUBCUTANEOUS

## 2018-01-26 MED ORDER — ENOXAPARIN SODIUM 40 MG/0.4ML ~~LOC~~ SOLN
40.0000 mg | SUBCUTANEOUS | Status: DC
  Administered 2018-01-27 – 2018-01-28 (×2): 40 mg via SUBCUTANEOUS
  Filled 2018-01-26 (×3): qty 0.4

## 2018-01-26 MED ORDER — CLOPIDOGREL BISULFATE 75 MG PO TABS
75.0000 mg | ORAL_TABLET | Freq: Every day | ORAL | Status: DC
  Administered 2018-01-26 – 2018-01-28 (×3): 75 mg via ORAL
  Filled 2018-01-26 (×3): qty 1

## 2018-01-26 MED ORDER — DOXYCYCLINE HYCLATE 100 MG PO TABS
100.0000 mg | ORAL_TABLET | Freq: Two times a day (BID) | ORAL | Status: DC
  Administered 2018-01-26 – 2018-01-28 (×5): 100 mg via ORAL
  Filled 2018-01-26 (×5): qty 1

## 2018-01-26 MED ORDER — ALBUTEROL SULFATE (2.5 MG/3ML) 0.083% IN NEBU
2.5000 mg | INHALATION_SOLUTION | Freq: Once | RESPIRATORY_TRACT | Status: AC
  Administered 2018-01-26: 2.5 mg via RESPIRATORY_TRACT
  Filled 2018-01-26: qty 3

## 2018-01-26 MED ORDER — CARVEDILOL 6.25 MG PO TABS
6.2500 mg | ORAL_TABLET | Freq: Two times a day (BID) | ORAL | Status: DC
  Administered 2018-01-26 – 2018-01-28 (×4): 6.25 mg via ORAL
  Filled 2018-01-26 (×4): qty 1

## 2018-01-26 MED ORDER — IPRATROPIUM-ALBUTEROL 0.5-2.5 (3) MG/3ML IN SOLN
3.0000 mL | Freq: Once | RESPIRATORY_TRACT | Status: AC
  Administered 2018-01-26: 3 mL via RESPIRATORY_TRACT
  Filled 2018-01-26: qty 3

## 2018-01-26 MED ORDER — ACETAMINOPHEN 500 MG PO TABS
1000.0000 mg | ORAL_TABLET | Freq: Once | ORAL | Status: AC
  Administered 2018-01-26: 1000 mg via ORAL
  Filled 2018-01-26: qty 2

## 2018-01-26 MED ORDER — ASPIRIN EC 81 MG PO TBEC
81.0000 mg | DELAYED_RELEASE_TABLET | Freq: Every day | ORAL | Status: DC
  Administered 2018-01-26 – 2018-01-28 (×3): 81 mg via ORAL
  Filled 2018-01-26 (×3): qty 1

## 2018-01-26 MED ORDER — ACETAMINOPHEN 325 MG PO TABS
650.0000 mg | ORAL_TABLET | Freq: Once | ORAL | Status: AC
  Administered 2018-01-26: 650 mg via ORAL
  Filled 2018-01-26: qty 2

## 2018-01-26 MED ORDER — ALBUTEROL SULFATE (2.5 MG/3ML) 0.083% IN NEBU
2.5000 mg | INHALATION_SOLUTION | RESPIRATORY_TRACT | Status: DC | PRN
  Administered 2018-01-26: 2.5 mg via RESPIRATORY_TRACT
  Filled 2018-01-26: qty 3

## 2018-01-26 MED ORDER — METHYLPREDNISOLONE SODIUM SUCC 125 MG IJ SOLR
80.0000 mg | Freq: Two times a day (BID) | INTRAMUSCULAR | Status: AC
  Administered 2018-01-26 (×2): 80 mg via INTRAVENOUS
  Filled 2018-01-26 (×2): qty 2

## 2018-01-26 NOTE — Progress Notes (Signed)
RT instructed pt on the use of flutter valve.  Pt able to demonstrate back good technique. 

## 2018-01-26 NOTE — ED Notes (Signed)
Attempted to call report to 6E but nurse is not available.  She is to call me back.

## 2018-01-26 NOTE — H&P (Signed)
History and Physical    Kristy Weeks WUJ:[id number] DOB: 1950-01-29 DOA: 01/25/2018  PCP: Leeann MustPodazra  Consultants:  Lottie RaterBlaylock - pulmonology Patient coming from:  Home - lives with son; Jackey LogeOK: Vincent PeyerSon, Kevin, [phone]  Chief Complaint: SOB  HPI: Kristy Weeks is a 67 y.o. female with medical history significant of HTN, CAD s/p stent placement, and COPD on home O2 presenting with SOB.  She noticed a scratchy throat and itchy ears with stuffiness on 12/26.  12/28, she coughed up a lot of green phlegm.  She was unable to cough anything up the next 2 days.  +SOB all the time.  No fevers.  +back pain, headache.  Her son had a cold leading up to this.  +wheezing.  She has been using breathing treatments without significant relief.  [facility] Dr. Loney Lohathore:  67 year old female with a history of COPD on 2 L home oxygen coming in for increasing dyspnea for the past few days, nasal congestion, and some cough. Noted to be hypoxic with O2 saturation 79% on her home oxygen. She was placed on 4 to 5 L supplemental oxygen. Received Solu-Medrol and continuous nebs. Continued to be tachypneic with increased work of breathing. Second round of continuous nebs ordered. Chest x-ray with questionable pneumonia and started on antibiotics. Currently requiring 3 L supplemental oxygen. Dr. Clarene DukeLittle believes her presentation is more consistent with COPD exacerbation. Thinks patient should go to the stepdown unit. Transfer accepted -observation, progressive care unit.  Review of Systems: As per HPI; otherwise review of systems reviewed and negative.   Ambulatory Status:  Ambulates without assistance  Past Medical History:  Diagnosis Date  . COPD (chronic obstructive pulmonary disease) (HCC)    wears 2L most of the time  . Coronary artery disease   . Hypertension     Past Surgical History:  Procedure Laterality Date  . CORONARY ANGIOPLASTY WITH STENT PLACEMENT      Social  History   Socioeconomic History  . Marital status: Single    Spouse name: Not on file  . Number of children: Not on file  . Years of education: Not on file  . Highest education level: Not on file  Occupational History  . Occupation: retired  Engineer, productionocial Needs  . Financial resource strain: Not on file  . Food insecurity:    Worry: Not on file    Inability: Not on file  . Transportation needs:    Medical: Not on file    Non-medical: Not on file  Tobacco Use  . Smoking status: Former Smoker    Packs/day: 1.00    Years: 40.00    Pack years: 40.00    Types: Cigarettes    Last attempt to quit: 2018    Years since quitting: 1.9  . Smokeless tobacco: Never Used  Substance and Sexual Activity  . Alcohol use: No  . Drug use: No  . Sexual activity: Not on file  Lifestyle  . Physical activity:    Days per week: Not on file    Minutes per session: Not on file  . Stress: Not on file  Relationships  . Social connections:    Talks on phone: Not on file    Gets together: Not on file    Attends religious service: Not on file    Active member of club or organization: Not on file    Attends meetings of clubs or organizations: Not on file    Relationship status: Not on file  .  Intimate partner violence:    Fear of current or ex partner: Not on file    Emotionally abused: Not on file    Physically abused: Not on file    Forced sexual activity: Not on file  Other Topics Concern  . Not on file  Social History Narrative  . Not on file    No Known Allergies  Family History  Problem Relation Age of Onset  . Heart failure Father 29    Prior to Admission medications   Medication Sig Start Date End Date Taking? Authorizing Provider  albuterol (PROVENTIL) (2.5 MG/3ML) 0.083% nebulizer solution Take 2.5 mg by nebulization every 6 (six) hours as needed for wheezing or shortness of breath.   Yes [provider]  Albuterol Sulfate 108 (90 Base) MCG/ACT AEPB Inhale 2-6 puffs into the  lungs daily.    Yes [provider]  aspirin EC 81 MG tablet Take 81 mg by mouth daily.   Yes [provider]  atorvastatin (LIPITOR) 40 MG tablet Take 40 mg by mouth at bedtime.   Yes [provider]  carvedilol (COREG) 6.25 MG tablet Take 6.25 mg by mouth 2 (two) times daily with a meal.   Yes [provider]  clopidogrel (PLAVIX) 75 MG tablet Take 75 mg by mouth daily.   Yes [provider]  fluticasone furoate-vilanterol (BREO ELLIPTA) 100-25 MCG/INH AEPB Inhale 1 puff into the lungs daily.   Yes [provider]  ipratropium-albuterol (DUONEB) 0.5-2.5 (3) MG/3ML SOLN Take 3 mLs by nebulization every 6 (six) hours as needed (for shortness of breath).    Yes [provider]  lisinopril (PRINIVIL,ZESTRIL) 10 MG tablet Take 10 mg by mouth at bedtime.   Yes [provider]  tiotropium (SPIRIVA) 18 MCG inhalation capsule Place 18 mcg into inhaler and inhale daily.   Yes [provider]  predniSONE (DELTASONE) 20 MG tablet Take 2 tablets (40 mg total) by mouth daily. Patient not taking: Reported on 01/26/2018 02/20/17   [name], PA-C    Physical Exam: Vitals:   01/26/18 1023 01/26/18 1216 01/26/18 1423 01/26/18 1456  BP: (!) 145/125   (!) 153/79  Pulse: 93 95 99 94  Resp:  20 18   Temp: 98.4 F (36.9 C)   98.9 F (37.2 C)  TempSrc: Oral   Oral  SpO2: 92% 90% 93% 93%  Weight:      Height:         General:  Appears calm and comfortable despite increased WOB Eyes:   EOMI, normal lids, iris ENT:  grossly normal hearing, lips & tongue, mmm Neck:  no LAD, masses or thyromegaly Cardiovascular:  RRR, no m/r/g. No LE edema.  Respiratory:  Diffusely decreased air movement with intermittent wheezing.  Increased respiratory effort with tachypnea (mild) and increased WOB. Abdomen:  soft, NT, ND, NABS Back:   normal alignment, no CVAT Skin:  no rash or induration seen on limited exam Musculoskeletal:   grossly normal tone BUE/BLE, good ROM, no bony abnormality Psychiatric:  grossly normal mood and affect, speech fluent and appropriate, AOx3 Neurologic:  CN 2-12 grossly intact, moves all extremities in coordinated fashion, sensation intact    Radiological Exams on Admission: Dg Chest Portable 1 View  Result Date: 01/25/2018 CLINICAL DATA:  67 y/o F; central chest pain and back pain with shortness of breath. EXAM: PORTABLE CHEST 1 VIEW COMPARISON:  03/20/2017 chest radiograph. FINDINGS: Stable cardiac silhouette given projection and technique. Hyperinflated lungs with emphysema. Architectural  distortion and scarring in the right lung apex. Superimposed increased opacification of the right lung apex. No pleural effusion or pneumothorax. Bones are unremarkable. IMPRESSION: Increased consolidation of the right lung apex may represent pneumonia, progressive scarring, or underlying mass. COPD and emphysema. Electronically Signed   By: Mitzi HansenLance  Furusawa-Stratton M.D.   On: 01/25/2018 20:15    EKG: not done   Labs on Admission: I have personally reviewed the available labs and imaging studies at the time of the admission.  Pertinent labs:   Glucose 173, CMP otherwise WNL Lactate 1.11 Normal CBC  INR 0.91 VBG: 7.355/56.4/57.0/31.8 UA: 250 glucose, trace Hgb, rare bacteria  Assessment/Plan Principal Problem:   Acute on chronic respiratory failure with hypoxia (HCC) Active Problems:   Hypertension   COPD (chronic obstructive pulmonary disease) (HCC)   Coronary artery disease   Acute on chronic respiratory failure associated with a COPD exacerbation -Patient's shortness of breath and productive cough are most likely caused by acute COPD exacerbation.  -She has history of O2-dependent COPD but intermittently wears [place] O2 -She does not have fever or leukocytosis.  -Chest x-ray is not clearly consistent with pneumonia; there is increased consolidation of the R lung apex but it is not certain  whether this is c/w infiltrate -will observe patient for now on med surg -Nebulizers: scheduled Duoneb and prn albuterol; hold Spiriva -Solu-Medrol 80 mg IV BID with transition to PO prednisone tomorrow -PO Doxycycline (has 3/3 cardinal symptoms)  -Continue Breo ellipta -Repeat CXR in AM, but will obtain 2 view at that time -Flutter valve  HTN -Continue Lisinopril, Coreg  CAD -Continue Plavix, Lipitor   DVT prophylaxis: Lovenox  Code Status:  Full - confirmed with patient Family Communication: None present Disposition Plan:  Home once clinically improved Consults called: CM/Nutrition/RT  Admission status: It is my clinical opinion that referral for OBSERVATION is reasonable and necessary in this patient based on the above information provided. The aforementioned taken together are felt to place the patient at high risk for further clinical deterioration. However it is anticipated that the patient may be medically stable for discharge from the hospital within 24 to 48 hours.   Jonah BlueJennifer Solimar Maiden MD [facility]  If note is complete, please contact covering daytime or nighttime physician. [email or web address] Password [email or web address]  01/26/2018, 4:26 PM

## 2018-01-26 NOTE — Plan of Care (Signed)
  Problem: Education: Goal: Knowledge of General Education information will improve Description: Including pain rating scale, medication(s)/side effects and non-pharmacologic comfort measures Outcome: Progressing   Problem: Clinical Measurements: Goal: Respiratory complications will improve Outcome: Progressing   

## 2018-01-26 NOTE — Progress Notes (Signed)
Nutrition Brief Note  Received consult from the COPD Protocol. Patient reports good intake at home. She is currently hungry; RD assisted patient with ordering lunch meal and provided a snack from floor stock. She has not lost any weight.   Wt Readings from Last 15 Encounters:  01/25/18 59 kg  05/31/16 56.7 kg    Body mass index is 22.31 kg/m. Patient meets criteria for normal weight based on current BMI.   Current diet order is soft. Patient is hungry, suspect she will eat well. Labs and medications reviewed.   No further nutrition interventions warranted at this time. If nutrition issues arise, please consult RD.   Joaquin CourtsKimberly Nejla Reasor, RD, LDN, CNSC Pager [phone] After Hours Pager [phone]

## 2018-01-26 NOTE — ED Provider Notes (Signed)
[facility] EMERGENCY DEPARTMENT Provider Note   CSN: [id number] Arrival date & time: 01/25/18  1850     History   Chief Complaint Chief Complaint  Patient presents with  . Shortness of Breath    HPI Kristy Weeks is a 67 y.o. female.  67 year old female with past medical history including COPD, CAD, hypertension who presents with shortness of breath.  Patient states that she has had several days of progressively worsening shortness of breath.  She recently developed a cold-like illness with productive cough, runny nose and nasal congestion, and scratchy throat.  No associated fevers.  Today the cough has been better but her shortness of breath has been worse.  She has been using albuterol at home.  She is normally on 2 L of oxygen at home, was noted to be 79% on home O2 at triage.  She denies any chest pain, vomiting, diarrhea, or other complaints.  The history is provided by the patient.  Shortness of Breath     Past Medical History:  Diagnosis Date  . COPD (chronic obstructive pulmonary disease) (HCC)   . Coronary artery disease   . Hypertension     Patient Active Problem List   Diagnosis Date Noted  . Acute on chronic respiratory failure with hypoxia (HCC) 01/25/2018    Past Surgical History:  Procedure Laterality Date  . CORONARY ANGIOPLASTY WITH STENT PLACEMENT       OB History   No obstetric history on file.      Home Medications    Prior to Admission medications   Medication Sig Start Date End Date Taking? Authorizing Provider  albuterol (PROVENTIL) (2.5 MG/3ML) 0.083% nebulizer solution Take 2.5 mg by nebulization every 6 (six) hours as needed for wheezing or shortness of breath.   Yes [provider]  Albuterol Sulfate 108 (90 Base) MCG/ACT AEPB Inhale into the lungs.   Yes [provider]  aspirin EC 81 MG tablet Take 81 mg by mouth daily.   Yes [provider]  atorvastatin (LIPITOR) 40 MG tablet Take 40 mg  by mouth at bedtime.   Yes [provider]  carvedilol (COREG) 6.25 MG tablet Take 6.25 mg by mouth 2 (two) times daily with a meal.   Yes [provider]  clopidogrel (PLAVIX) 75 MG tablet Take 75 mg by mouth daily.   Yes [provider]  fluticasone furoate-vilanterol (BREO ELLIPTA) 100-25 MCG/INH AEPB Inhale 1 puff into the lungs daily.   Yes [provider]  ipratropium-albuterol (DUONEB) 0.5-2.5 (3) MG/3ML SOLN Take 3 mLs by nebulization.   Yes [provider]  lisinopril (PRINIVIL,ZESTRIL) 10 MG tablet Take 10 mg by mouth at bedtime.   Yes [provider]  tiotropium (SPIRIVA) 18 MCG inhalation capsule Place 18 mcg into inhaler and inhale daily.   Yes [provider]  albuterol (ACCUNEB) 0.63 MG/3ML nebulizer solution Take 1 ampule by nebulization every 6 (six) hours as needed for wheezing.    [provider]  lisinopril (PRINIVIL,ZESTRIL) 10 MG tablet Take 10 mg by mouth 2 (two) times daily.    [provider]  predniSONE (DELTASONE) 20 MG tablet Take 2 tablets (40 mg total) by mouth daily. 02/20/17   Bethel BornGekas, Kelly Marie, PA-C    Family History No family history on file.  Social History Social History   Tobacco Use  . Smoking status: Current Every Day Smoker    Packs/day: 0.50    Types: Cigarettes  . Smokeless tobacco: Never Used  Substance Use Topics  . Alcohol use: No  . Drug use: No     Allergies   Patient has no known allergies.   Review of Systems Review of Systems  Respiratory: Positive for shortness of breath.    All other systems reviewed and are negative except that which was mentioned in HPI   Physical Exam Updated Vital Signs BP (!) 174/91   Pulse 93   Temp 97.7 F (36.5 C) (Oral)   Resp (!) 28   Ht 5\' 4"  (1.626 m)   Wt 59 kg   SpO2 92%   BMI 22.31 kg/m   Physical Exam Vitals signs and nursing note reviewed.  Constitutional:      General: She is in acute distress.       Appearance: She is well-developed.  HENT:     Head: Normocephalic and atraumatic.  Eyes:     Conjunctiva/sclera: Conjunctivae normal.  Neck:     Musculoskeletal: Neck supple.  Cardiovascular:     Rate and Rhythm: Normal rate and regular rhythm.     Heart sounds: Normal heart sounds. No murmur.  Pulmonary:     Effort: Tachypnea, accessory muscle usage and respiratory distress present.     Breath sounds: Decreased breath sounds present.     Comments: In mild respiratory distress with increased work of breathing, accessory muscle use, and tachypnea.  Severely diminished breath sounds with almost no air movement in bases Abdominal:     General: Bowel sounds are normal. There is no distension.     Palpations: Abdomen is soft.     Tenderness: There is no abdominal tenderness.  Skin:    General: Skin is warm and dry.  Neurological:     Mental Status: She is alert and oriented to person, place, and time.     Comments: Fluent speech  Psychiatric:        Mood and Affect: Mood is anxious.        Judgment: Judgment normal.      ED Treatments / Results  Labs (all labs ordered are listed, but only abnormal results are displayed) Labs Reviewed  COMPREHENSIVE METABOLIC PANEL - Abnormal; Notable for the following components:      Result Value   Glucose, Bld 173 (*)    All other components within normal limits  URINALYSIS, ROUTINE W REFLEX MICROSCOPIC - Abnormal; Notable for the following components:   Glucose, UA 250 (*)    Hgb urine dipstick TRACE (*)    All other components within normal limits  URINALYSIS, MICROSCOPIC (REFLEX) - Abnormal; Notable for the following components:   Bacteria, UA RARE (*)    All other components within normal limits  I-STAT VENOUS BLOOD GAS, ED - Abnormal; Notable for the following components:   pO2, Ven 57.0 (*)    Bicarbonate 31.8 (*)    TCO2 34 (*)    Acid-Base Excess 4.0 (*)    All other components within normal limits  CULTURE, BLOOD (ROUTINE  X 2)  CULTURE, BLOOD (ROUTINE X 2)  CBC WITH DIFFERENTIAL/PLATELET  PROTIME-INR  I-STAT CG4 LACTIC ACID, ED    EKG None  Radiology Dg Chest Portable 1 View  Result Date: 01/25/2018 CLINICAL DATA:  67 y/o F; central chest pain and back pain with shortness of breath. EXAM: PORTABLE CHEST 1 VIEW COMPARISON:  03/20/2017 chest radiograph. FINDINGS: Stable cardiac silhouette given projection and technique. Hyperinflated lungs with emphysema. Architectural distortion and scarring in the right lung apex. Superimposed increased opacification of the right  lung apex. No pleural effusion or pneumothorax. Bones are unremarkable. IMPRESSION: Increased consolidation of the right lung apex may represent pneumonia, progressive scarring, or underlying mass. COPD and emphysema. Electronically Signed   By: Mitzi HansenLance  Furusawa-Stratton M.D.   On: 01/25/2018 20:15    Procedures .Critical Care Performed by: Laurence SpatesLittle, Rachel Morgan, MD Authorized by: Laurence SpatesLittle, Rachel Morgan, MD   Critical care provider statement:    Critical care time (minutes):  30   Critical care time was exclusive of:  Separately billable procedures and treating other patients   Critical care was necessary to treat or prevent imminent or life-threatening deterioration of the following conditions:  Respiratory failure   Critical care was time spent personally by me on the following activities:  Development of treatment plan with patient or surrogate, evaluation of patient's response to treatment, examination of patient, obtaining history from patient or surrogate, ordering and performing treatments and interventions, ordering and review of laboratory studies, ordering and review of radiographic studies, re-evaluation of patient's condition and review of old charts   (including critical care time)  Medications Ordered in ED Medications  albuterol (PROVENTIL,VENTOLIN) solution continuous neb (0 mg/hr Nebulization Stopped 01/25/18 2145)  0.9 %   sodium chloride infusion ( Intravenous Stopped 01/25/18 2200)  albuterol (PROVENTIL,VENTOLIN) solution continuous neb (0 mg/hr Nebulization Stopped 01/25/18 2323)  albuterol (PROVENTIL) (2.5 MG/3ML) 0.083% nebulizer solution 2.5 mg (2.5 mg Nebulization Given 01/25/18 1912)  ipratropium-albuterol (DUONEB) 0.5-2.5 (3) MG/3ML nebulizer solution 3 mL (0 mLs Nebulization [facility] 01/25/[facility] 1915)  methylPREDNISolone sodium succinate (SOLU-MEDROL) 125 mg/2 mL injection 125 mg (125 mg Intravenous Given 01/25/18 2009)  cefTRIAXone (ROCEPHIN) 1 g in sodium chloride 0.9 % 100 mL IVPB (0 g Intravenous Stopped 01/25/18 2200)  levofloxacin (LEVAQUIN) tablet 500 mg (500 mg Oral Given 01/25/18 2058)  acetaminophen (TYLENOL) tablet 650 mg (650 mg Oral Given 01/25/18 2216)  lisinopril (PRINIVIL,ZESTRIL) tablet 10 mg (10 mg Oral Given 01/25/18 2305)     Initial Impression / Assessment and Plan / ED Course  I have reviewed the triage vital signs and the nursing notes.  Pertinent labs & imaging results that were available during my care of the patient were reviewed by me and considered in my medical decision making (see chart for details).    Respiratory distress on initial exam.  Gave albuterol which improved her pulse ox, later placed on 1 hour of continuous albuterol gave Solu-Medrol.  BMP and CBC reassuring.  Chest x-ray shows possible right apical pneumonia versus scarring or mass.  Because of her recent cough/cold symptoms, gave ceftriaxone and Levaquin to cover for pneumonia. Ordered RVP.   For first hour, patient remains severely dyspneic, mild improvement in air movement but still diminished.  Her blood gases reassuring without hypercarbia.  Placed on repeat hour of continuous albuterol.  Discussed admission with [facility], Dr. Loney Lohathore, and pt admitted for further treatment. Final Clinical Impressions(s) / ED Diagnoses   Final diagnoses:  Acute respiratory failure with hypoxia (HCC)  Community  acquired pneumonia of right lung, unspecified part of lung  COPD exacerbation [facility](HCC)    ED Discharge Orders    None       Little, Ambrose Finlandachel Morgan, MD 01/26/18 0104

## 2018-01-26 NOTE — Care Management Obs Status (Signed)
MEDICARE OBSERVATION STATUS NOTIFICATION   Patient Details  Name: Kristy Weeks MRN: [id number] Date of Birth: [birth year]   Medicare Observation Status Notification Given:  Yes    Bess KindsWendi B Kynzlee Hucker, RN 01/26/2018, 4:18 PM

## 2018-01-27 ENCOUNTER — Observation Stay (HOSPITAL_COMMUNITY): Payer: Medicare Other

## 2018-01-27 DIAGNOSIS — B974 Respiratory syncytial virus as the cause of diseases classified elsewhere: Secondary | ICD-10-CM | POA: Diagnosis present

## 2018-01-27 DIAGNOSIS — I10 Essential (primary) hypertension: Secondary | ICD-10-CM | POA: Diagnosis present

## 2018-01-27 DIAGNOSIS — Z9981 Dependence on supplemental oxygen: Secondary | ICD-10-CM | POA: Diagnosis not present

## 2018-01-27 DIAGNOSIS — Z7952 Long term (current) use of systemic steroids: Secondary | ICD-10-CM | POA: Diagnosis not present

## 2018-01-27 DIAGNOSIS — J9621 Acute and chronic respiratory failure with hypoxia: Secondary | ICD-10-CM | POA: Diagnosis present

## 2018-01-27 DIAGNOSIS — J9601 Acute respiratory failure with hypoxia: Secondary | ICD-10-CM | POA: Diagnosis present

## 2018-01-27 DIAGNOSIS — G8929 Other chronic pain: Secondary | ICD-10-CM | POA: Diagnosis present

## 2018-01-27 DIAGNOSIS — Z87891 Personal history of nicotine dependence: Secondary | ICD-10-CM | POA: Diagnosis not present

## 2018-01-27 DIAGNOSIS — J440 Chronic obstructive pulmonary disease with acute lower respiratory infection: Secondary | ICD-10-CM | POA: Diagnosis present

## 2018-01-27 DIAGNOSIS — J441 Chronic obstructive pulmonary disease with (acute) exacerbation: Secondary | ICD-10-CM | POA: Diagnosis present

## 2018-01-27 DIAGNOSIS — M545 Low back pain: Secondary | ICD-10-CM | POA: Diagnosis present

## 2018-01-27 DIAGNOSIS — J209 Acute bronchitis, unspecified: Secondary | ICD-10-CM | POA: Diagnosis present

## 2018-01-27 DIAGNOSIS — Z79899 Other long term (current) drug therapy: Secondary | ICD-10-CM | POA: Diagnosis not present

## 2018-01-27 DIAGNOSIS — Z7982 Long term (current) use of aspirin: Secondary | ICD-10-CM | POA: Diagnosis not present

## 2018-01-27 DIAGNOSIS — Z955 Presence of coronary angioplasty implant and graft: Secondary | ICD-10-CM | POA: Diagnosis not present

## 2018-01-27 DIAGNOSIS — J069 Acute upper respiratory infection, unspecified: Secondary | ICD-10-CM | POA: Diagnosis present

## 2018-01-27 DIAGNOSIS — I2510 Atherosclerotic heart disease of native coronary artery without angina pectoris: Secondary | ICD-10-CM | POA: Diagnosis present

## 2018-01-27 DIAGNOSIS — Z7902 Long term (current) use of antithrombotics/antiplatelets: Secondary | ICD-10-CM | POA: Diagnosis not present

## 2018-01-27 DIAGNOSIS — Z7951 Long term (current) use of inhaled steroids: Secondary | ICD-10-CM | POA: Diagnosis not present

## 2018-01-27 LAB — GLUCOSE, CAPILLARY
Glucose-Capillary: 183 mg/dL — ABNORMAL HIGH (ref 70–99)
Glucose-Capillary: 115 mg/dL — ABNORMAL HIGH (ref 70–99)
Glucose-Capillary: 117 mg/dL — ABNORMAL HIGH (ref 70–99)
Glucose-Capillary: 160 mg/dL — ABNORMAL HIGH (ref 70–99)

## 2018-01-27 LAB — HIV ANTIBODY (ROUTINE TESTING W REFLEX): HIV Screen 4th Generation wRfx: NONREACTIVE

## 2018-01-27 MED ORDER — TRAMADOL HCL 50 MG PO TABS
50.0000 mg | ORAL_TABLET | Freq: Two times a day (BID) | ORAL | Status: DC | PRN
  Administered 2018-01-27 – 2018-01-28 (×2): 50 mg via ORAL
  Filled 2018-01-27 (×2): qty 1

## 2018-01-27 MED ORDER — IPRATROPIUM-ALBUTEROL 0.5-2.5 (3) MG/3ML IN SOLN
3.0000 mL | Freq: Three times a day (TID) | RESPIRATORY_TRACT | Status: DC
  Administered 2018-01-27 – 2018-01-28 (×3): 3 mL via RESPIRATORY_TRACT
  Filled 2018-01-27 (×3): qty 3

## 2018-01-27 MED ORDER — ACETAMINOPHEN 325 MG PO TABS
650.0000 mg | ORAL_TABLET | Freq: Four times a day (QID) | ORAL | Status: DC | PRN
  Administered 2018-01-27 – 2018-01-28 (×3): 650 mg via ORAL
  Filled 2018-01-27 (×3): qty 2

## 2018-01-27 NOTE — Progress Notes (Addendum)
PROGRESS NOTE    Kristy Weeks  YQI:[id number] DOB: November 25, 1950 DOA: 01/25/2018 PCP: Patient, No Pcp Per   Brief Narrative:  68 year old with history of essential hypertension, CAD status post stent, COPD on 2 L nasal cannula at home came to the hospital with complains of shortness of breath, cough and productive sputum.  She was admitted to the hospital for concerns of acute COPD exacerbation with possible acute bronchitis.  She was started on routine steroid treatments along with bronchodilators and doxycycline.   Assessment & Plan:   Principal Problem:   Acute on chronic respiratory failure with hypoxia (HCC) Active Problems:   Hypertension   COPD (chronic obstructive pulmonary disease) (HCC)   Coronary artery disease  Acute on chronic hypoxic respiratory failure, 2 L nasal cannula Acute exacerbation of mild to moderate COPD Acute bronchitis - Continue bronchodilator treatment scheduled and as necessary.  On Breo Ellipta - Continue doxycycline -Incentive spirometry, flutter valve - We will transition IV Solu-Medrol to oral and see how she tolerates oral without any issues and to ensure she makes progress  Low back pain -This is chronic.  Will give Tylenol for mild to moderate pain and tramadol for moderate to severe pain  Essential hypertension -Continue Coreg and lisinopril  Coronary artery disease -Currently patient is chest pain-free.  Plan to continue aspirin, Plavix and statin  PT/OT consulted  DVT prophylaxis: Lovenox Code Status: Full code Family Communication: None at bedside Disposition Plan: Patient is still significantly short of breath with minimal exertion in the room.  Patient still requires at least another 24-48 days of hospitalization to ensure her breathing status improves   Subjective: Although patient feels better at rest she is significantly short of breath even walking to the bathroom.  Review of Systems Otherwise negative except as per  HPI, including: General: Denies fever, chills, night sweats or unintended weight loss. Resp: Denies wheezing Cardiac: Denies chest pain, palpitations, orthopnea, paroxysmal nocturnal dyspnea. GI: Denies abdominal pain, nausea, vomiting, diarrhea or constipation GU: Denies dysuria, frequency, hesitancy or incontinence MS: Denies muscle aches, joint pain or swelling Neuro: Denies headache, neurologic deficits (focal weakness, numbness, tingling), abnormal gait Psych: Denies anxiety, depression, SI/HI/AVH Skin: Denies new rashes or lesions ID: Denies sick contacts, exotic exposures, travel  Objective: Vitals:   01/26/18 2048 01/27/18 0550 01/27/18 0859 01/27/18 0900  BP:  120/68    Pulse:  74  88  Resp:  17  18  Temp:  (!) 97.5 F (36.4 C)    TempSrc:  Oral    SpO2: 97% 96% 98% 98%  Weight:  56.7 kg    Height:        Intake/Output Summary (Last 24 hours) at 01/27/2018 1040 Last data filed at 01/27/2018 0845 Gross per 24 hour  Intake 480 ml  Output 1100 ml  Net -620 ml   Filed Weights   01/25/18 1901 01/27/18 0550  Weight: 59 kg 56.7 kg    Examination:  General exam: Appears calm and comfortable, on 2 L nasal cannula Respiratory system: Diffuse diminished breath sounds Cardiovascular system: S1 & S2 heard, RRR. No JVD, murmurs, rubs, gallops or clicks. No pedal edema. Gastrointestinal system: Abdomen is nondistended, soft and nontender. No organomegaly or masses felt. Normal bowel sounds heard. Central nervous system: Alert and oriented. No focal neurological deficits. Extremities: Symmetric 5 x 5 power. Skin: No rashes, lesions or ulcers Psychiatry: Judgement and insight appear normal. Mood & affect appropriate.     Data Reviewed:   CBC: Recent Labs  Lab 01/25/18 1908  WBC 9.7  NEUTROABS 7.7  HGB 12.5  HCT 40.7  MCV 94.2  PLT 245   Basic Metabolic Panel: Recent Labs  Lab 01/25/18 1908  NA 136  K 3.9  CL 99  CO2 30  GLUCOSE 173*  BUN 9  CREATININE  0.68  CALCIUM 9.3   GFR: Estimated Creatinine Clearance: 57.3 mL/min (by C-G formula based on SCr of 0.68 mg/dL). Liver Function Tests: Recent Labs  Lab 01/25/18 1908  AST 21  ALT 18  ALKPHOS 79  BILITOT 0.7  PROT 7.6  ALBUMIN 4.2   No results for input(s): LIPASE, AMYLASE in the last 168 hours. No results for input(s): AMMONIA in the last 168 hours. Coagulation Profile: Recent Labs  Lab 01/25/18 1908  INR 0.91   Cardiac Enzymes: No results for input(s): CKTOTAL, CKMB, CKMBINDEX, TROPONINI in the last 168 hours. BNP (last 3 results) No results for input(s): PROBNP in the last 8760 hours. HbA1C: Recent Labs    01/26/18 1121  HGBA1C 5.4   CBG: Recent Labs  Lab 01/26/18 1121 01/26/18 1711 01/26/18 2137 01/27/18 0818  GLUCAP 136* 131* 129* 183*   Lipid Profile: No results for input(s): CHOL, HDL, LDLCALC, TRIG, CHOLHDL, LDLDIRECT in the last 72 hours. Thyroid Function Tests: No results for input(s): TSH, T4TOTAL, FREET4, T3FREE, THYROIDAB in the last 72 hours. Anemia Panel: No results for input(s): VITAMINB12, FOLATE, FERRITIN, TIBC, IRON, RETICCTPCT in the last 72 hours. Sepsis Labs: Recent Labs  Lab 01/25/18 1916  LATICACIDVEN 1.11    Recent Results (from the past 240 hour(s))  Culture, blood (Routine x 2)     Status: None (Preliminary result)   Collection Time: 01/25/18  7:00 PM  Result Value Ref Range Status   Specimen Description   Final    BLOOD LEFT ANTECUBITAL Performed at [facility] [place], [address] [place], [place] [address]    Special Requests   Final    BOTTLES DRAWN AEROBIC AND ANAEROBIC Blood Culture adequate volume Performed at [facility] [place], [address] [place], [place] [address]    Culture   Final    NO GROWTH < 24 HOURS Performed at [facility], [address] [place], [place] [address]    Report Status PENDING  Incomplete  Culture, blood (Routine x 2)     Status: None (Preliminary result)    Collection Time: 01/25/18  8:55 PM  Result Value Ref Range Status   Specimen Description   Final    BLOOD RIGHT ANTECUBITAL Performed at [facility] [place], [address] [place], [place] [address]    Special Requests   Final    BOTTLES DRAWN AEROBIC AND ANAEROBIC Blood Culture adequate volume Performed at [facility] [place], [address] [place], [place] [address]    Culture   Final    NO GROWTH < 24 HOURS Performed at [facility], [address] [place], [place] [address]    Report Status PENDING  Incomplete  Respiratory Panel by PCR     Status: Abnormal   Collection Time: 01/26/18  1:08 AM  Result Value Ref Range Status   Adenovirus NOT DETECTED NOT DETECTED Final   Coronavirus 229E NOT DETECTED NOT DETECTED Final   Coronavirus HKU1 NOT DETECTED NOT DETECTED Final   Coronavirus NL63 NOT DETECTED NOT DETECTED Final   Coronavirus OC43 NOT DETECTED NOT DETECTED Final   Metapneumovirus NOT DETECTED NOT DETECTED Final  Rhinovirus / Enterovirus NOT DETECTED NOT DETECTED Final   Influenza A NOT DETECTED NOT DETECTED Final   Influenza B NOT DETECTED NOT DETECTED Final   Parainfluenza Virus 1 NOT DETECTED NOT DETECTED Final   Parainfluenza Virus 2 NOT DETECTED NOT DETECTED Final   Parainfluenza Virus 3 NOT DETECTED NOT DETECTED Final   Parainfluenza Virus 4 NOT DETECTED NOT DETECTED Final   Respiratory Syncytial Virus DETECTED (A) NOT DETECTED Final    Comment: CRITICAL RESULT CALLED TO, READ BACK BY AND VERIFIED WITH: Cristela Blue RN 16:30 01/26/18 ([email or web address])    Bordetella pertussis NOT DETECTED NOT DETECTED Final   Chlamydophila pneumoniae NOT DETECTED NOT DETECTED Final   Mycoplasma pneumoniae NOT DETECTED NOT DETECTED Final    Comment: Performed at [facility], [address], [place], [place] [address]         Radiology Studies: Dg Chest 2 View  Result Date: 01/27/2018 CLINICAL DATA:  Chest pain, shortness of breath. EXAM: CHEST - 2 VIEW  COMPARISON:  None. FINDINGS: The heart size and mediastinal contours are within normal limits. Severe emphysematous disease is seen in the left upper lobe. Right upper lobe opacity is noted which most likely represent scarring and pleural thickening with associated bullous disease, but underlying acute inflammation or neoplasm can not be excluded. The visualized skeletal structures are unremarkable. IMPRESSION: Right upper lobe opacity is noted most consistent with scarring or pleural thickening, but possible acute inflammation or neoplasm can not be excluded. CT scan of the chest with contrast administration is recommended for further evaluation. Emphysema (ICD10-J43.9). Electronically Signed   By: Lupita Raider, M.D.   On: 01/27/2018 07:35   Dg Chest Portable 1 View  Result Date: 01/25/2018 CLINICAL DATA:  68 y/o F; central chest pain and back pain with shortness of breath. EXAM: PORTABLE CHEST 1 VIEW COMPARISON:  03/20/2017 chest radiograph. FINDINGS: Stable cardiac silhouette given projection and technique. Hyperinflated lungs with emphysema. Architectural distortion and scarring in the right lung apex. Superimposed increased opacification of the right lung apex. No pleural effusion or pneumothorax. Bones are unremarkable. IMPRESSION: Increased consolidation of the right lung apex may represent pneumonia, progressive scarring, or underlying mass. COPD and emphysema. Electronically Signed   By: Mitzi Hansen M.D.   On: 01/25/2018 20:15        Scheduled Meds: . aspirin EC  81 mg Oral Daily  . atorvastatin  40 mg Oral QHS  . carvedilol  6.25 mg Oral BID WC  . clopidogrel  75 mg Oral Daily  . doxycycline  100 mg Oral Q12H  . enoxaparin (LOVENOX) injection  40 mg Subcutaneous Q24H  . fluticasone furoate-vilanterol  1 puff Inhalation Daily  . insulin aspart  0-15 Units Subcutaneous TID WC  . ipratropium-albuterol  3 mL Nebulization Q6H  . lisinopril  10 mg Oral QHS  . predniSONE  40  mg Oral Q breakfast   Continuous Infusions: . sodium chloride Stopped (01/25/18 2200)     LOS: 0 days   Time spent= 35 mins     Joline Maxcy, MD [facility] Pager [phone]   If 7PM-7AM, please contact night-coverage [email or web address] Password [email or web address] 01/27/2018, 10:40 AM

## 2018-01-27 NOTE — Plan of Care (Signed)

## 2018-01-28 DIAGNOSIS — J9621 Acute and chronic respiratory failure with hypoxia: Secondary | ICD-10-CM | POA: Diagnosis not present

## 2018-01-28 LAB — GLUCOSE, CAPILLARY
Glucose-Capillary: 104 mg/dL — ABNORMAL HIGH (ref 70–99)
Glucose-Capillary: 135 mg/dL — ABNORMAL HIGH (ref 70–99)

## 2018-01-28 MED ORDER — TRAMADOL HCL 50 MG PO TABS: 50.0000 mg | ORAL_TABLET | Freq: Two times a day (BID) | ORAL | 0 refills | Status: DC | PRN

## 2018-01-28 MED ORDER — PREDNISONE 20 MG PO TABS: 40.0000 mg | ORAL_TABLET | Freq: Every day | ORAL | 0 refills | Status: AC

## 2018-01-28 MED ORDER — DOXYCYCLINE HYCLATE 100 MG PO TABS: 100.0000 mg | ORAL_TABLET | Freq: Two times a day (BID) | ORAL | 0 refills | Status: AC

## 2018-01-28 NOTE — Discharge Instructions (Signed)
Acute Bronchitis, Adult Acute bronchitis is when air tubes (bronchi) in the lungs suddenly get swollen. The condition can make it hard to breathe. It can also cause these symptoms:  A cough.  Coughing up clear, yellow, or green mucus.  Wheezing.  Chest congestion.  Shortness of breath.  A fever.  Body aches.  Chills.  A sore throat. Follow these instructions at home:  Medicines  Take over-the-counter and prescription medicines only as told by your doctor.  If you were prescribed an antibiotic medicine, take it as told by your doctor. Kristy Weeks not stop taking the antibiotic even if you start to feel better. General instructions  Rest.  Drink enough fluids to keep your pee (urine) pale yellow.  Avoid smoking and secondhand smoke. If you smoke and you need help quitting, ask your doctor. Quitting will help your lungs heal faster.  Use an inhaler, cool mist vaporizer, or humidifier as told by your doctor.  Keep all follow-up visits as told by your doctor. This is important. How is this prevented? To lower your risk of getting this condition again:  Wash your hands often with soap and water. If you cannot use soap and water, use hand sanitizer.  Avoid contact with people who have cold symptoms.  Try not to touch your hands to your mouth, nose, or eyes.  Make sure to get the flu shot every year. Contact a doctor if:  Your symptoms Kristy Weeks not get better in 2 weeks. Get help right away if:  You cough up blood.  You have chest pain.  You have very bad shortness of breath.  You become dehydrated.  You faint (pass out) or keep feeling like you are going to pass out.  You keep throwing up (vomiting).  You have a very bad headache.  Your fever or chills gets worse. This information is not intended to replace advice given to you by your health care provider. Make sure you discuss any questions you have with your health care provider. Document Released: 07/02/2007 Document  Revised: 08/27/2016 Document Reviewed: 07/04/2015 Elsevier Interactive Patient Education  2019 [employer]vier Inc. COPD and Physical Activity Chronic obstructive pulmonary disease (COPD) is a long-term (chronic) condition that affects the lungs. COPD is a general term that can be used to describe many different lung problems that cause lung swelling (inflammation) and limit airflow, including chronic bronchitis and emphysema. The main symptom of COPD is shortness of breath, which makes it harder to Kristy Weeks even simple tasks. This can also make it harder to exercise and be active. Talk with your health care provider about treatments to help you breathe better and actions you can take to prevent breathing problems during physical activity. What are the benefits of exercising with COPD? Exercising regularly is an important part of a healthy lifestyle. You can still exercise and Kristy Weeks physical activities even though you have COPD. Exercise and physical activity improve your shortness of breath by increasing blood flow (circulation). This causes your heart to pump more oxygen through your body. Moderate exercise can improve your:  Oxygen use.  Energy level.  Shortness of breath.  Strength in your breathing muscles.  Heart health.  Sleep.  Self-esteem and feelings of self-worth.  Depression, stress, and anxiety levels. Exercise can benefit everyone with COPD. The severity of your disease may affect how hard you can exercise, especially at first, but everyone can benefit. Talk with your health care provider about how much exercise is safe for you, and which activities and exercises  are safe for you. What actions can I take to prevent breathing problems during physical activity?  Sign up for a pulmonary rehabilitation program. This type of program may include: ? Education about lung diseases. ? Exercise classes that teach you how to exercise and be more active while improving your breathing. This usually  involves:  Exercise using your lower extremities, such as a stationary bicycle.  About 30 minutes of exercise, 2 to 5 times per week, for 6 to 12 weeks  Strength training, such as push ups or leg lifts. ? Nutrition education. ? Group classes in which you can talk with others who also have COPD and learn ways to manage stress.  If you use an oxygen tank, you should use it while you exercise. Work with your health care provider to adjust your oxygen for your physical activity. Your resting flow rate is different from your flow rate during physical activity.  While you are exercising: ? Take slow breaths. ? Pace yourself and Kristy Weeks not try to go too fast. ? Purse your lips while breathing out. Pursing your lips is similar to a kissing or whistling position. ? If doing exercise that uses a quick burst of effort, such as weight lifting:  Breathe in before starting the exercise.  Breathe out during the hardest part of the exercise (such as raising the weights). Where to find support You can find support for exercising with COPD from:  Your health care provider.  A pulmonary rehabilitation program.  Your local health department or community health programs.  Support groups, online or in-person. Your health care provider may be able to recommend support groups. Where to find more information You can find more information about exercising with COPD from:  American Lung Association: [place].hulung.org.  COPD Foundation: [employer].glcopdfoundation.org. Contact a health care provider if:  Your symptoms get worse.  You have chest pain.  You have nausea.  You have a fever.  You have trouble talking or catching your breath.  You want to start a new exercise program or a new activity. Summary  COPD is a general term that can be used to describe many different lung problems that cause lung swelling (inflammation) and limit airflow. This includes chronic bronchitis and emphysema.  Exercise and physical  activity improve your shortness of breath by increasing blood flow (circulation). This causes your heart to provide more oxygen to your body.  Contact your health care provider before starting any exercise program or new activity. Ask your health care provider what exercises and activities are safe for you. This information is not intended to replace advice given to you by your health care provider. Make sure you discuss any questions you have with your health care provider. Document Released: 02/05/2017 Document Revised: 02/05/2017 Document Reviewed: 02/05/2017 Elsevier Interactive Patient Education  2019 [employer]

## 2018-01-28 NOTE — Discharge Summary (Signed)
Physician Discharge Summary  Kristy Weeks YQM:[id number] DOB: [birth year] DOA: 01/25/2018  PCP: Patient, No Pcp Per  Admit date: 01/25/2018 Discharge date: 01/28/2018  Admitted From: Home Disposition: Home  Recommendations for Outpatient Follow-up:  1. Follow up with PCP in 1-2 weeks 2. Please obtain BMP/CBC in one week your next doctors visit.  3. Advised to take oral doxycycline for 4 more days, oral prednisone for 4 days 4. 10 tablets of tramadol prescribed for her low back pain  Home Health: None Equipment/Devices: None Discharge Condition: Stable CODE STATUS: Full code Diet recommendation: Heart healthy  Brief/Interim Summary: 68 year old with history of essential hypertension, CAD status post stent, COPD on 2 L nasal cannula at home came to the hospital with complains of shortness of breath, cough and productive sputum.  She was admitted to the hospital for concerns of acute COPD exacerbation with possible acute bronchitis.  She was started on routine steroid treatments along with bronchodilators and doxycyc Patient's respiratory panel came back positive for RSV.  She started doing better over the course of 48 hours.  She felt almost back to herself and wanted to go home.  I have advised her to continue using bronchodilators about 3-4 times daily for next 3-5 days.  She needs to follow-up outpatient with primary care provider in about 1 week to ensure she continues to improve. She was having quite a bit of low back pain during hospitalization therefore tramadol was started which helped her a lot.  We will give her 10 tablets of tramadol and will need to follow-up outpatient with primary care provider for long-term management.  Discharge Diagnoses:  Principal Problem:   Acute on chronic respiratory failure with hypoxia (HCC) Active Problems:   Hypertension   COPD (chronic obstructive pulmonary disease) (HCC)   Coronary artery disease   Acute respiratory failure with hypoxia  (HCC)  Acute on chronic hypoxic respiratory failure, 2 L nasal cannula Acute exacerbation of mild to moderate COPD Upper respiratory tract infection with RSV - Continue bronchodilator treatment scheduled and as necessary.  On Breo Ellipta -  Continue oral doxycycline and prednisone for 4 more days -Incentive spirometry, flutter valve - Respiratory panel is positive for RSV  Low back pain -This is chronic orthopedic pain.  Tramadol has worked well for her.  We will give her 10 tablets of tramadol on discharge.  She will need to follow-up outpatient with primary care provider for further management.  Essential hypertension -Continue Coreg and lisinopril  Coronary artery disease -Currently patient is chest pain-free.  Plan to continue aspirin, Plavix and statin  On Lovenox for DVT prophylaxis during hospitalization Full code Discharge today  Discharge Instructions   Allergies as of 01/28/2018   No Known Allergies     Medication List    TAKE these medications   Albuterol Sulfate 108 (90 Base) MCG/ACT Aepb Inhale 2-6 puffs into the lungs daily.   albuterol (2.5 MG/3ML) 0.083% nebulizer solution Commonly known as:  PROVENTIL Take 2.5 mg by nebulization every 6 (six) hours as needed for wheezing or shortness of breath.   aspirin EC 81 MG tablet Take 81 mg by mouth daily.   atorvastatin 40 MG tablet Commonly known as:  LIPITOR Take 40 mg by mouth at bedtime.   BREO ELLIPTA 100-25 MCG/INH Aepb Generic drug:  fluticasone furoate-vilanterol Inhale 1 puff into the lungs daily.   carvedilol 6.25 MG tablet Commonly known as:  COREG Take 6.25 mg by mouth 2 (two) times daily with a meal.  clopidogrel 75 MG tablet Commonly known as:  PLAVIX Take 75 mg by mouth daily.   doxycycline 100 MG tablet Commonly known as:  VIBRA-TABS Take 1 tablet (100 mg total) by mouth every 12 (twelve) hours for 4 days.   ipratropium-albuterol 0.5-2.5 (3) MG/3ML Soln Commonly known as:   DUONEB Take 3 mLs by nebulization every 6 (six) hours as needed (for shortness of breath).   lisinopril 10 MG tablet Commonly known as:  PRINIVIL,ZESTRIL Take 10 mg by mouth at bedtime.   predniSONE 20 MG tablet Commonly known as:  DELTASONE Take 2 tablets (40 mg total) by mouth daily with breakfast for 4 days. Start taking on:  January 29, 2018 What changed:  when to take this   tiotropium 18 MCG inhalation capsule Commonly known as:  SPIRIVA Place 18 mcg into inhaler and inhale daily.   traMADol 50 MG tablet Commonly known as:  ULTRAM Take 1 tablet (50 mg total) by mouth every 12 (twelve) hours as needed for up to 10 doses for moderate pain or severe pain.       No Known Allergies  You were cared for by a hospitalist during your hospital stay. If you have any questions about your discharge medications or the care you received while you were in the hospital after you are discharged, you can call the unit and asked to speak with the hospitalist on call if the hospitalist that took care of you is not available. Once you are discharged, your primary care physician will handle any further medical issues. Please note that no refills for any discharge medications will be authorized once you are discharged, as it is imperative that you return to your primary care physician (or establish a relationship with a primary care physician if you do not have one) for your aftercare needs so that they can reassess your need for medications and monitor your lab values.  Consultations:  None   Procedures/Studies: Dg Chest 2 View  Result Date: 01/27/2018 CLINICAL DATA:  Chest pain, shortness of breath. EXAM: CHEST - 2 VIEW COMPARISON:  None. FINDINGS: The heart size and mediastinal contours are within normal limits. Severe emphysematous disease is seen in the left upper lobe. Right upper lobe opacity is noted which most likely represent scarring and pleural thickening with associated bullous disease,  but underlying acute inflammation or neoplasm can not be excluded. The visualized skeletal structures are unremarkable. IMPRESSION: Right upper lobe opacity is noted most consistent with scarring or pleural thickening, but possible acute inflammation or neoplasm can not be excluded. CT scan of the chest with contrast administration is recommended for further evaluation. Emphysema (ICD10-J43.9). Electronically Signed   By: Lupita Raider, M.D.   On: 01/27/2018 07:35   Dg Chest Portable 1 View  Result Date: 01/25/2018 CLINICAL DATA:  68 y/o F; central chest pain and back pain with shortness of breath. EXAM: PORTABLE CHEST 1 VIEW COMPARISON:  03/20/2017 chest radiograph. FINDINGS: Stable cardiac silhouette given projection and technique. Hyperinflated lungs with emphysema. Architectural distortion and scarring in the right lung apex. Superimposed increased opacification of the right lung apex. No pleural effusion or pneumothorax. Bones are unremarkable. IMPRESSION: Increased consolidation of the right lung apex may represent pneumonia, progressive scarring, or underlying mass. COPD and emphysema. Electronically Signed   By: Mitzi Hansen M.D.   On: 01/25/2018 20:15      Subjective: Ambulating with minimal assistance.  She is doing well, tells me significantly feels better in terms of her breathing.  General = no fevers, chills, dizziness, malaise, fatigue HEENT/EYES = negative for pain, redness, loss of vision, double vision, blurred vision, loss of hearing, sore throat, hoarseness, dysphagia Cardiovascular= negative for chest pain, palpitation, murmurs, lower extremity swelling Respiratory/lungs= negative for hemoptysis, wheezing, mucus production Gastrointestinal= negative for nausea, vomiting,, abdominal pain, melena, hematemesis Genitourinary= negative for Dysuria, Hematuria, Change in Urinary Frequency MSK = Negative for arthralgia, myalgias, Back Pain, Joint swelling  Neurology=  Negative for headache, seizures, numbness, tingling  Psychiatry= Negative for anxiety, depression, suicidal and homocidal ideation Allergy/Immunology= Medication/Food allergy as listed  Skin= Negative for Rash, lesions, ulcers, itching    Discharge Exam: Vitals:   01/28/18 0905 01/28/18 0909  BP:    Pulse: 83   Resp: 16   Temp:    SpO2: 92% 92%   Vitals:   01/27/18 2123 01/28/18 0621 01/28/18 0905 01/28/18 0909  BP: 134/83 (!) 147/84    Pulse: 75 72 83   Resp:   16   Temp: 97.9 F (36.6 C) 98 F (36.7 C)    TempSrc: Oral Oral    SpO2: 93% 94% 92% 92%  Weight:  57.4 kg    Height:        General: Pt is alert, awake, not in acute distress, 2 L nasal cannula Cardiovascular: RRR, S1/S2 +, no rubs, no gallops Respiratory: Mild coarse breath sounds Abdominal: Soft, NT, ND, bowel sounds + Extremities: no edema, no cyanosis    The results of significant diagnostics from this hospitalization (including imaging, microbiology, ancillary and laboratory) are listed below for reference.     Microbiology: Recent Results (from the past 240 hour(s))  Culture, blood (Routine x 2)     Status: None (Preliminary result)   Collection Time: 01/25/18  7:00 PM  Result Value Ref Range Status   Specimen Description   Final    BLOOD LEFT ANTECUBITAL Performed at [facility], [address] [place], [place] [address]    Special Requests   Final    BOTTLES DRAWN AEROBIC AND ANAEROBIC Blood Culture adequate volume Performed at [facility], [address] [place], [place] [address]    Culture   Final    NO GROWTH 2 DAYS Performed at [facility], [address], [place], [place] [address]    Report Status PENDING  Incomplete  Culture, blood (Routine x 2)     Status: None (Preliminary result)   Collection Time: 01/25/18  8:55 PM  Result Value Ref Range Status   Specimen Description   Final    BLOOD RIGHT ANTECUBITAL Performed at [facility],  [address] [place], [place] [address]    Special Requests   Final    BOTTLES DRAWN AEROBIC AND ANAEROBIC Blood Culture adequate volume Performed at [facility], [address] [place], [place] [address]    Culture   Final    NO GROWTH 2 DAYS Performed at [facility], [address] [place], [place] [address]    Report Status PENDING  Incomplete  Respiratory Panel by PCR     Status: Abnormal   Collection Time: 01/26/18  1:08 AM  Result Value Ref Range Status   Adenovirus NOT DETECTED NOT DETECTED Final   Coronavirus 229E NOT DETECTED NOT DETECTED Final   Coronavirus HKU1 NOT DETECTED NOT DETECTED Final   Coronavirus NL63 NOT DETECTED NOT DETECTED Final   Coronavirus OC43 NOT DETECTED NOT DETECTED Final  Metapneumovirus NOT DETECTED NOT DETECTED Final   Rhinovirus / Enterovirus NOT DETECTED NOT DETECTED Final   Influenza A NOT DETECTED NOT DETECTED Final   Influenza B NOT DETECTED NOT DETECTED Final   Parainfluenza Virus 1 NOT DETECTED NOT DETECTED Final   Parainfluenza Virus 2 NOT DETECTED NOT DETECTED Final   Parainfluenza Virus 3 NOT DETECTED NOT DETECTED Final   Parainfluenza Virus 4 NOT DETECTED NOT DETECTED Final   Respiratory Syncytial Virus DETECTED (A) NOT DETECTED Final    Comment: CRITICAL RESULT CALLED TO, READ BACK BY AND VERIFIED WITH: Cristela BlueA. Cooper RN 16:30 01/26/18 ([email or web address])    Bordetella pertussis NOT DETECTED NOT DETECTED Final   Chlamydophila pneumoniae NOT DETECTED NOT DETECTED Final   Mycoplasma pneumoniae NOT DETECTED NOT DETECTED Final    Comment: Performed at [facility], [address] [place], [place] [address]     Labs: BNP (last 3 results) Recent Labs    02/20/17 1549  BNP 72.4   Basic Metabolic Panel: Recent Labs  Lab 01/25/18 1908  NA 136  K 3.9  CL 99  CO2 30  GLUCOSE 173*  BUN 9  CREATININE 0.68  CALCIUM 9.3   Liver Function Tests: Recent Labs  Lab 01/25/18 1908  AST 21  ALT 18  ALKPHOS 79   BILITOT 0.7  PROT 7.6  ALBUMIN 4.2   No results for input(s): LIPASE, AMYLASE in the last 168 hours. No results for input(s): AMMONIA in the last 168 hours. CBC: Recent Labs  Lab 01/25/18 1908  WBC 9.7  NEUTROABS 7.7  HGB 12.5  HCT 40.7  MCV 94.2  PLT 245   Cardiac Enzymes: No results for input(s): CKTOTAL, CKMB, CKMBINDEX, TROPONINI in the last 168 hours. BNP: Invalid input(s): POCBNP CBG: Recent Labs  Lab 01/27/18 0818 01/27/18 1301 01/27/18 1706 01/27/18 2120 01/28/18 0754  GLUCAP 183* 115* 117* 160* 104*   D-Dimer No results for input(s): DDIMER in the last 72 hours. Hgb A1c Recent Labs    01/26/18 1121  HGBA1C 5.4   Lipid Profile No results for input(s): CHOL, HDL, LDLCALC, TRIG, CHOLHDL, LDLDIRECT in the last 72 hours. Thyroid function studies No results for input(s): TSH, T4TOTAL, T3FREE, THYROIDAB in the last 72 hours.  Invalid input(s): FREET3 Anemia work up No results for input(s): VITAMINB12, FOLATE, FERRITIN, TIBC, IRON, RETICCTPCT in the last 72 hours. Urinalysis    Component Value Date/Time   COLORURINE YELLOW 01/25/2018 2243   APPEARANCEUR CLEAR 01/25/2018 2243   LABSPEC 1.020 01/25/2018 2243   PHURINE 6.0 01/25/2018 2243   GLUCOSEU 250 (A) 01/25/2018 2243   HGBUR TRACE (A) 01/25/2018 2243   BILIRUBINUR NEGATIVE 01/25/2018 2243   KETONESUR NEGATIVE 01/25/2018 2243   PROTEINUR NEGATIVE 01/25/2018 2243   NITRITE NEGATIVE 01/25/2018 2243   LEUKOCYTESUR NEGATIVE 01/25/2018 2243   Sepsis Labs Invalid input(s): PROCALCITONIN,  WBC,  LACTICIDVEN Microbiology Recent Results (from the past 240 hour(s))  Culture, blood (Routine x 2)     Status: None (Preliminary result)   Collection Time: 01/25/18  7:00 PM  Result Value Ref Range Status   Specimen Description   Final    BLOOD LEFT ANTECUBITAL Performed at [facility], [address] [place], [place] [address]    Special Requests   Final    BOTTLES DRAWN AEROBIC AND  ANAEROBIC Blood Culture adequate volume Performed at [facility], [address] [place], [place] [address]    Culture   Final  NO GROWTH 2 DAYS Performed at [facility], [address] [place], [place] [address]    Report Status PENDING  Incomplete  Culture, blood (Routine x 2)     Status: None (Preliminary result)   Collection Time: 01/25/18  8:55 PM  Result Value Ref Range Status   Specimen Description   Final    BLOOD RIGHT ANTECUBITAL Performed at [facility] [place], [address] [place], [place] [address]    Special Requests   Final    BOTTLES DRAWN AEROBIC AND ANAEROBIC Blood Culture adequate volume Performed at [facility] [place], [address] [place], [place] [address]    Culture   Final    NO GROWTH 2 DAYS Performed at [facility], [address] [place], [place] [address]    Report Status PENDING  Incomplete  Respiratory Panel by PCR     Status: Abnormal   Collection Time: 01/26/18  1:08 AM  Result Value Ref Range Status   Adenovirus NOT DETECTED NOT DETECTED Final   Coronavirus 229E NOT DETECTED NOT DETECTED Final   Coronavirus HKU1 NOT DETECTED NOT DETECTED Final   Coronavirus NL63 NOT DETECTED NOT DETECTED Final   Coronavirus OC43 NOT DETECTED NOT DETECTED Final   Metapneumovirus NOT DETECTED NOT DETECTED Final   Rhinovirus / Enterovirus NOT DETECTED NOT DETECTED Final   Influenza A NOT DETECTED NOT DETECTED Final   Influenza B NOT DETECTED NOT DETECTED Final   Parainfluenza Virus 1 NOT DETECTED NOT DETECTED Final   Parainfluenza Virus 2 NOT DETECTED NOT DETECTED Final   Parainfluenza Virus 3 NOT DETECTED NOT DETECTED Final   Parainfluenza Virus 4 NOT DETECTED NOT DETECTED Final   Respiratory Syncytial Virus DETECTED (A) NOT DETECTED Final    Comment: CRITICAL RESULT CALLED TO, READ BACK BY AND VERIFIED WITH: Cristela BlueA. Cooper RN 16:30 01/26/18 ([email or web address])    Bordetella pertussis NOT DETECTED NOT DETECTED Final    Chlamydophila pneumoniae NOT DETECTED NOT DETECTED Final   Mycoplasma pneumoniae NOT DETECTED NOT DETECTED Final    Comment: Performed at [facility], [address] [place], [place] [address]     Time coordinating discharge:  I have spent 35 minutes face to face with the patient and on the ward discussing the patients care, assessment, plan and disposition with other care givers. >50% of the time was devoted counseling the patient about the risks and benefits of treatment/Discharge disposition and coordinating care.   SIGNED:   Dimple NanasAnkit Chirag Tambi Thole, MD  [facility] 01/28/2018, 10:09 AM Pager   If 7PM-7AM, please contact night-coverage [email or web address] Password [email or web address]

## 2018-01-28 NOTE — Evaluation (Signed)
Occupational Therapy Evaluation Patient Details Name: Kristy Weeks MRN: [id number] DOB: 06-29-1950 Today's Date: 01/28/2018    History of Present Illness Pt is a 68 y.o. female admitted 01/27/18 with acute on chronic respiratory failure, worked up for COPD exacerbation with possible acute bronchitis. PMH includes CAD, HTN, COPD, low back pain.   Clinical Impression   Pt admitted with above diagnoses, decreased activity tolerance and cardiopulmonary status limiting pt ability to complete BADL/IADL at desired level of ind. PTA pt very independent, driving self to all community outings- chronically on 2L [place] and demonstrates proficiency of usage. Educated pt on energy conservation strategies in home and community, pt in understanding. BSC t/f and hygiene completed at supervision level, pt with DOE and O2 sats at 85. Pt states "I predicted it would be at 86", stating this is her baseline. Encouraged pursed lip breathing and pacing strategies, and BSC for home. Pt declined equipment usage in the home. Pt dressed self at set up A for home, anticipate d/c this date. No further OT follow up due to pt preference.     Follow Up Recommendations  No OT follow up    Equipment Recommendations  Other (comment)(recommended 3:1, pt declined)    Recommendations for Other Services       Precautions / Restrictions Precautions Precautions: Fall;Other (comment) Precaution Comments: Chronic 2.5 L O2 [place] Restrictions Weight Bearing Restrictions: No      Mobility Bed Mobility Overal bed mobility: Independent                Transfers Overall transfer level: Independent                    Balance Overall balance assessment: Mild deficits observed, not formally tested Sitting-balance support: No upper extremity supported Sitting balance-Leahy Scale: Good                                     ADL either performed or assessed with clinical judgement   ADL Overall ADL's :  Needs assistance/impaired Eating/Feeding: Independent   Grooming: Supervision/safety;Standing   Upper Body Bathing: Supervision/ safety;Standing Upper Body Bathing Details (indicate cue type and reason): educated on shower seat, pt  not wanting to consider at this time Lower Body Bathing: Supervison/ safety;Sit to/from stand   Upper Body Dressing : Set up;Sitting Upper Body Dressing Details (indicate cue type and reason): donned shirt and sweatshirt Lower Body Dressing: Set up;Sit to/from stand   Toilet Transfer: Supervision/safety;BSC   Toileting- ArchitectClothing Manipulation and Hygiene: Supervision/safety   Tub/ Engineer, structuralhower Transfer: Supervision/safety   Functional mobility during ADLs: Supervision/safety General ADL Comments: pt currently supervision level for ADLs, fatigues easily. Does not have DME at home and is deferring the option of having such. Educated pt on energy conservation, pt understanding.     Vision Baseline Vision/History: Wears glasses Wears Glasses: Reading only Patient Visual Report: No change from baseline       Perception     Praxis      Pertinent Vitals/Pain Pain Assessment: No/denies pain     Hand Dominance     Extremity/Trunk Assessment Upper Extremity Assessment Upper Extremity Assessment: Overall WFL for tasks assessed   Lower Extremity Assessment Lower Extremity Assessment: Defer to PT evaluation       Communication Communication Communication: No difficulties   Cognition Arousal/Alertness: Awake/alert Behavior During Therapy: WFL for tasks assessed/performed Overall Cognitive Status: Within Functional Limits for tasks assessed  General Comments: WFL for simple tasks   General Comments  SpO2 >/ 88% on 2L O2 [place]. Seated BP 150/80, post-ambulation BP 142/80    Exercises     Shoulder Instructions      Home Living Family/patient expects to be discharged to:: Private residence Living  Arrangements: Children Available Help at Discharge: Family;Available PRN/intermittently Type of Home: House Home Access: Stairs to enter [employer] of Steps: 2-3   Home Layout: One level     Bathroom Shower/Tub: Chief Strategy Officer: Standard     Home Equipment: None          Prior Functioning/Environment Level of Independence: Independent        Comments: drives, takes self to all community outings, does all ADLs ind        OT Problem List: Decreased activity tolerance;Decreased knowledge of use of DME or AE;Cardiopulmonary status limiting activity      OT Treatment/Interventions:      OT Goals(Current goals can be found in the care plan section) Acute Rehab OT Goals Patient Stated Goal: go home OT Goal Formulation: With patient Time For Goal Achievement: 02/11/18 Potential to Achieve Goals: Good  OT Frequency:     Barriers to D/C:            Co-evaluation              AM-PAC OT "6 Clicks" Daily Activity     Outcome Measure Help from another person eating meals?: None Help from another person taking care of personal grooming?: None Help from another person toileting, which includes using toliet, bedpan, or urinal?: None Help from another person bathing (including washing, rinsing, drying)?: None Help from another person to put on and taking off regular upper body clothing?: None Help from another person to put on and taking off regular lower body clothing?: None 6 Click Score: 24   End of Session Equipment Utilized During Treatment: Oxygen;Gait belt  Activity Tolerance: Patient tolerated treatment well Patient left: in bed;with call bell/phone within reach  OT Visit Diagnosis: Other abnormalities of gait and mobility (R26.89)                Time: 0932-3557 OT Time Calculation (min): 28 min Charges:  OT General Charges $OT Visit: 1 Visit OT Evaluation $OT Eval Moderate Complexity: 1 Mod OT Treatments $Self Care/Home  Management : 8-22 mins  Dalphine Handing, MSOT, OTR/L Behavioral Health OT/ Acute Relief OT [facility] Office: [phone]   Dalphine Handing 01/28/2018, 11:23 AM

## 2018-01-28 NOTE — Evaluation (Signed)
Physical Therapy Evaluation Patient Details Name: Kristy Weeks MRN: [id number] DOB: [birth year] Today's Date: 01/28/2018   History of Present Illness  Pt is a 68 y.o. female admitted 01/27/18 with acute on chronic respiratory failure, worked up for COPD exacerbation with possible acute bronchitis. PMH includes CAD, HTN, COPD, low back pain.    Clinical Impression  Pt presents with an overall decrease in functional mobility secondary to above. PTA, pt indep and lives with son who works during day; pt reports increasing difficulty performing ADLs and household tasks secondary to DOE. Today, pt ambulatory at supervision-level; SpO2 >/88% on 2L O2 [place]. Educ on energy conservation strategies and importance of mobility program (SX:JDBZMCE bouts of activity more frequently, although rest periods). Pt would benefit from use of rollator for added stability and energy conservation. Pt would benefit from continued acute PT services to maximize functional mobility and independence prior to d/c home.     Follow Up Recommendations No PT follow up;Supervision - Intermittent(declined HHPT)    Equipment Recommendations  Other (comment)(rollator)    Recommendations for Other Services       Precautions / Restrictions Precautions Precautions: Fall;Other (comment) Precaution Comments: Chronic 2.5 L O2 [place] Restrictions Weight Bearing Restrictions: No      Mobility  Bed Mobility Overal bed mobility: Independent                Transfers Overall transfer level: Independent                  Ambulation/Gait Ambulation/Gait assistance: Supervision Gait Distance (Feet): 40 Feet Assistive device: None Gait Pattern/deviations: Step-through pattern;Decreased stride length Gait velocity: Decreased Gait velocity interpretation: <1.8 ft/sec, indicate of risk for recurrent falls General Gait Details: Agreeable to ambulation in room. Slow amb with supervison for balance; 1x lateral LOB, pt able to  self-correct without UE support. Able to amb with and without pushing O2 tank  Stairs            Wheelchair Mobility    Modified Rankin (Stroke Patients Only)       Balance Overall balance assessment: Mild deficits observed, not formally tested                                           Pertinent Vitals/Pain Pain Assessment: No/denies pain    Home Living Family/patient expects to be discharged to:: Private residence Living Arrangements: Children Available Help at Discharge: Family;Available PRN/intermittently Type of Home: House Home Access: Stairs to enter   [employer] of Steps: 2-3 Home Layout: One level Home Equipment: None      Prior Function Level of Independence: Independent         Comments: Drives, runs errands, indep with mobility. Reports increasing SOB/DOE with difficulty performing ADLs and household tasks during days leading to admission     Hand Dominance        Extremity/Trunk Assessment   Upper Extremity Assessment Upper Extremity Assessment: Overall WFL for tasks assessed    Lower Extremity Assessment Lower Extremity Assessment: Generalized weakness       Communication   Communication: No difficulties  Cognition Arousal/Alertness: Awake/alert Behavior During Therapy: WFL for tasks assessed/performed Overall Cognitive Status: Within Functional Limits for tasks assessed  General Comments: WFL for simple tasks      General Comments General comments (skin integrity, edema, etc.): SpO2 >/ 88% on 2L O2 [place]. Seated BP 150/80, post-ambulation BP 142/80    Exercises     Assessment/Plan    PT Assessment Patient needs continued PT services  PT Problem List Decreased strength;Decreased activity tolerance;Decreased balance;Decreased mobility;Cardiopulmonary status limiting activity       PT Treatment Interventions DME instruction;Gait training;Stair  training;Functional mobility training;Therapeutic activities;Therapeutic exercise;Balance training;Patient/family education    PT Goals (Current goals can be found in the Care Plan section)  Acute Rehab PT Goals Patient Stated Goal: Return home PT Goal Formulation: With patient Time For Goal Achievement: 02/11/18 Potential to Achieve Goals: Good    Frequency Min 3X/week   Barriers to discharge        Co-evaluation               AM-PAC PT "6 Clicks" Mobility  Outcome Measure Help needed turning from your back to your side while in a flat bed without using bedrails?: None Help needed moving from lying on your back to sitting on the side of a flat bed without using bedrails?: None Help needed moving to and from a bed to a chair (including a wheelchair)?: None Help needed standing up from a chair using your arms (e.g., wheelchair or bedside chair)?: A Little Help needed to walk in hospital room?: A Little Help needed climbing 3-5 steps with a railing? : A Little 6 Click Score: 21    End of Session Equipment Utilized During Treatment: Oxygen Activity Tolerance: Patient tolerated treatment well;Patient limited by fatigue Patient left: in bed;with call bell/phone within reach Nurse Communication: Mobility status PT Visit Diagnosis: Other abnormalities of gait and mobility (R26.89);Muscle weakness (generalized) (M62.81)    Time: 5809-9833 PT Time Calculation (min) (ACUTE ONLY): 21 min   Charges:   PT Evaluation $PT Eval Moderate Complexity: 1 Mod        Ina Homes, PT, DPT Acute Rehabilitation Services  Pager [phone] Office [phone]  Malachy Chamber 01/28/2018, 8:53 AM

## 2018-01-30 LAB — CULTURE, BLOOD (ROUTINE X 2)
CULTURE: NO GROWTH
Culture: NO GROWTH
Special Requests: ADEQUATE
Special Requests: ADEQUATE

## 2019-08-05 IMAGING — DX DG CHEST 2V
2 series · 2 of 2 positions shown · non-contrast
Comparison: 05/31/2016

CLINICAL DATA: Chest tightness, weakness

EXAM:
CHEST  2 VIEW

[chest pa]
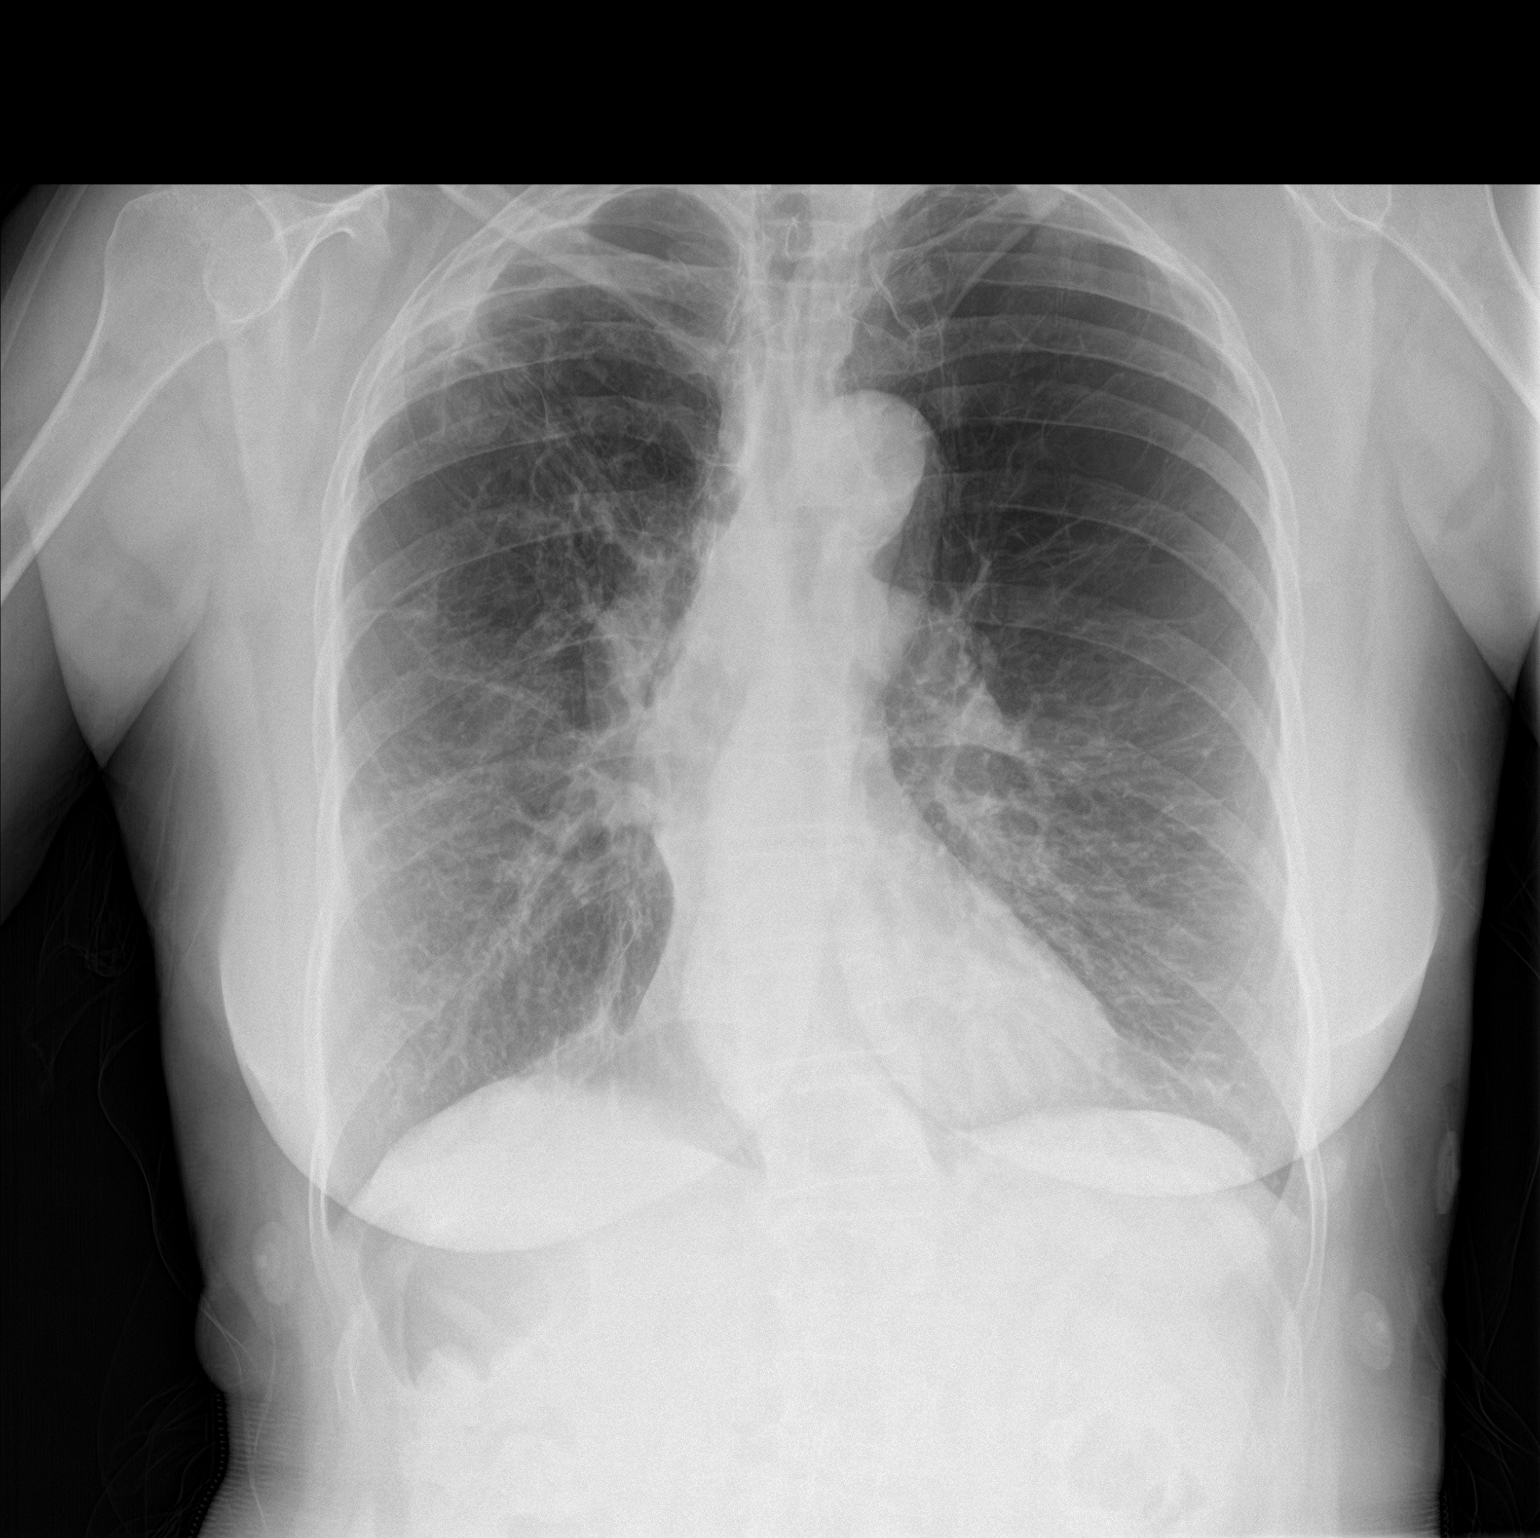

[chest lat]
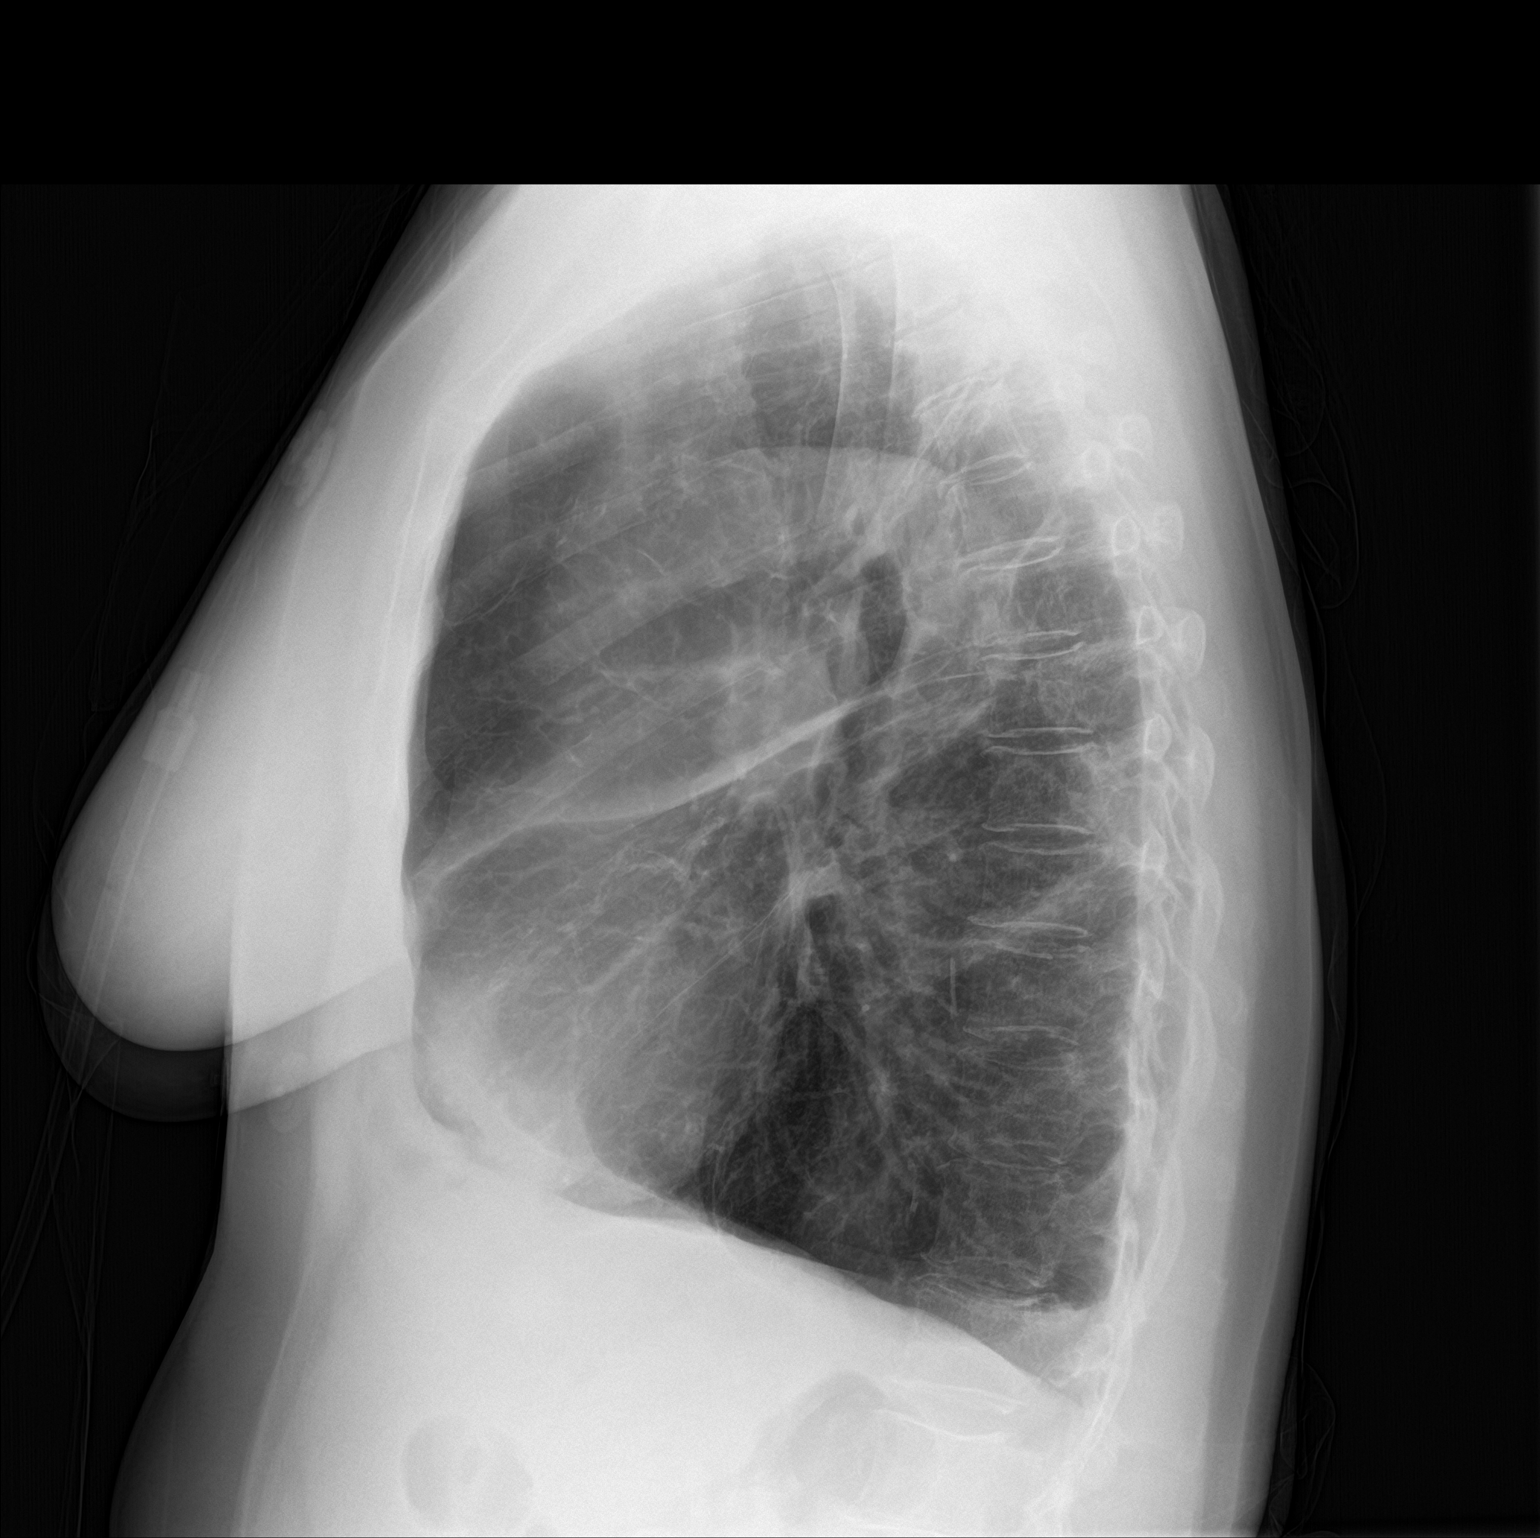

[2 of 2 positions shown; findings below may reference images not displayed]

FINDINGS: The lungs are hyperinflated likely secondary to COPD. There is right
apical scarring. There is no focal parenchymal opacity. There is no
pleural effusion or pneumothorax. The heart and mediastinal contours
are unremarkable.

The osseous structures are unremarkable.
IMPRESSION: No active cardiopulmonary disease.

## 2020-07-11 IMAGING — DX DG CHEST 2V
2 series · 2 of 2 positions shown · non-contrast
Comparison: None.

CLINICAL DATA: Chest pain, shortness of breath.

EXAM:
CHEST - 2 VIEW

[chest pa]
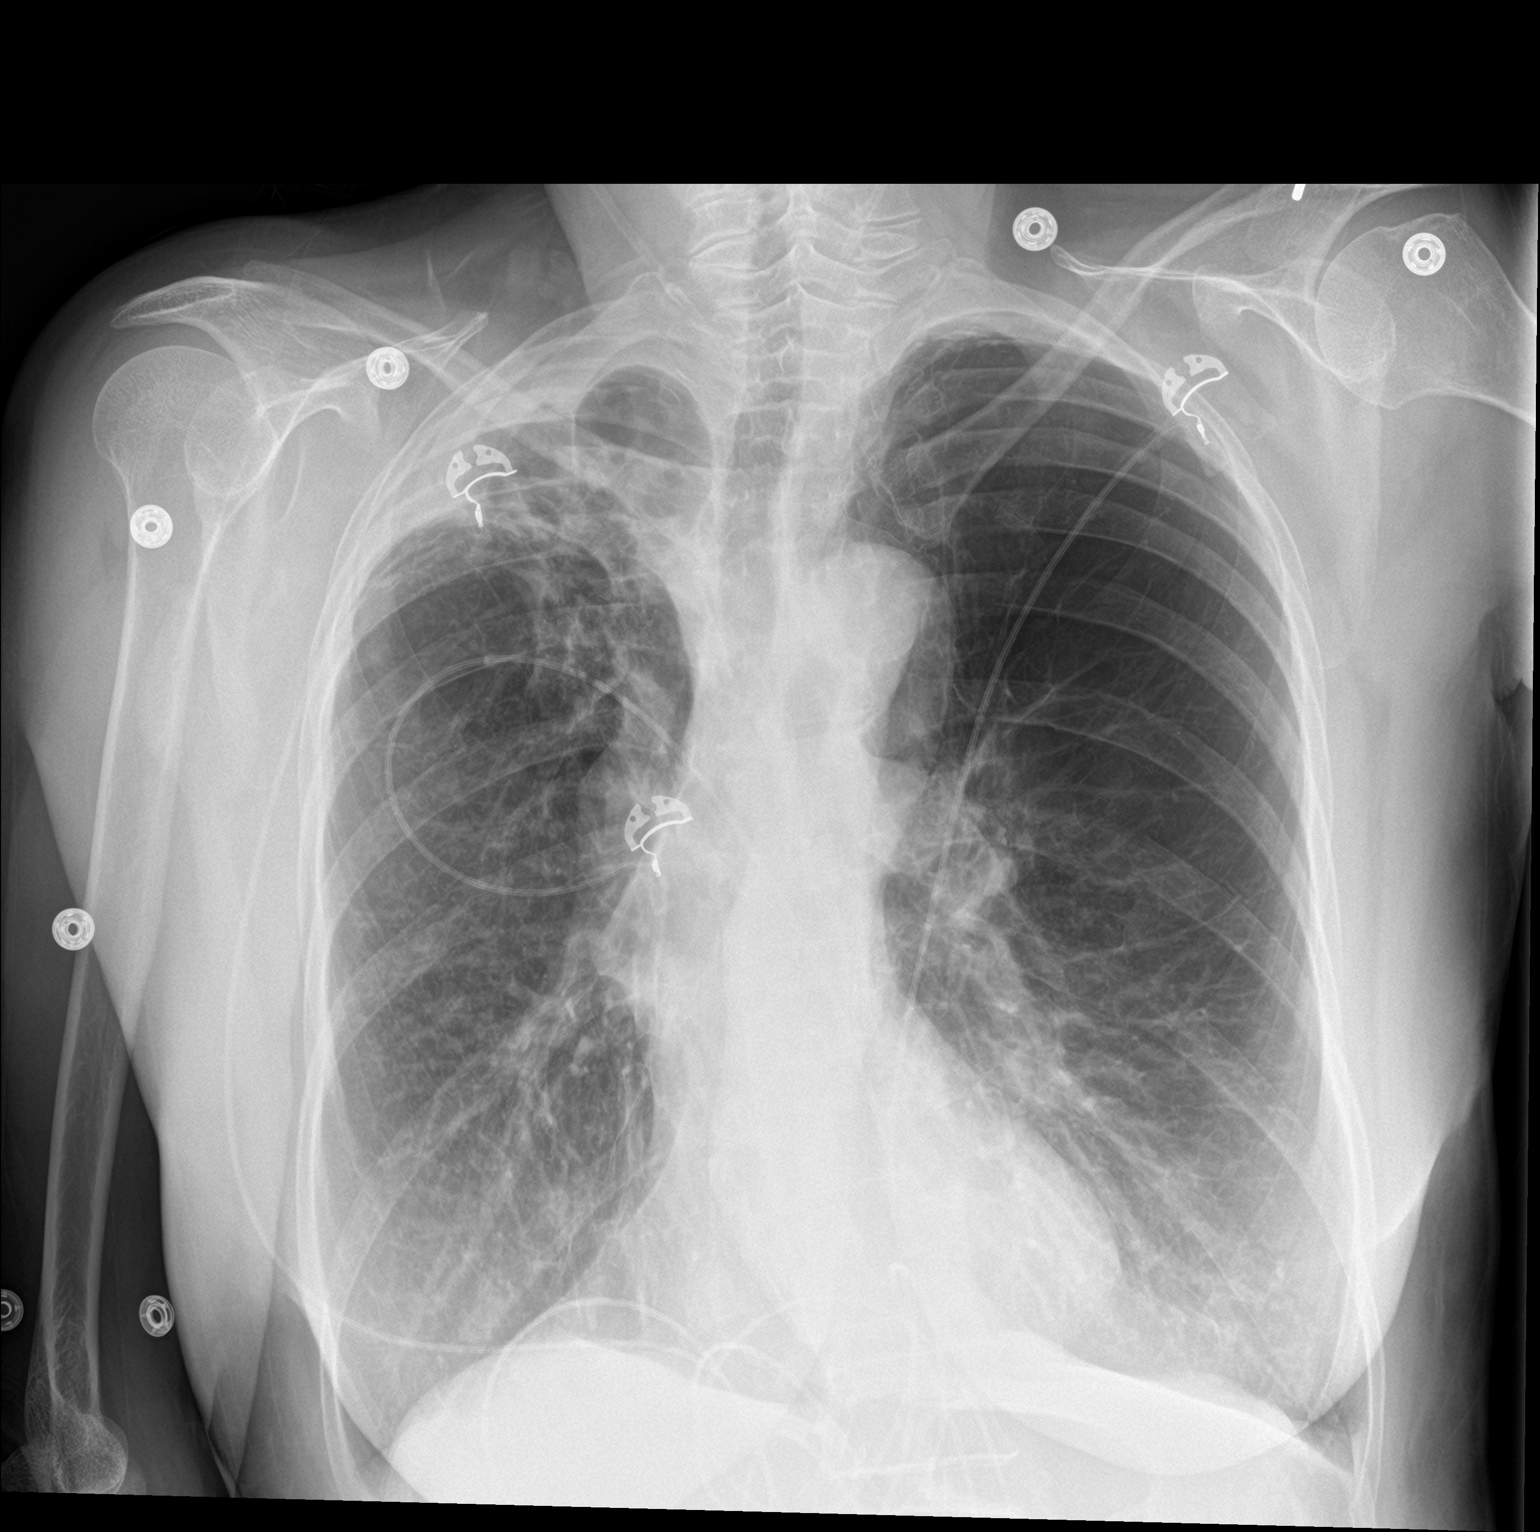

[chest lat]
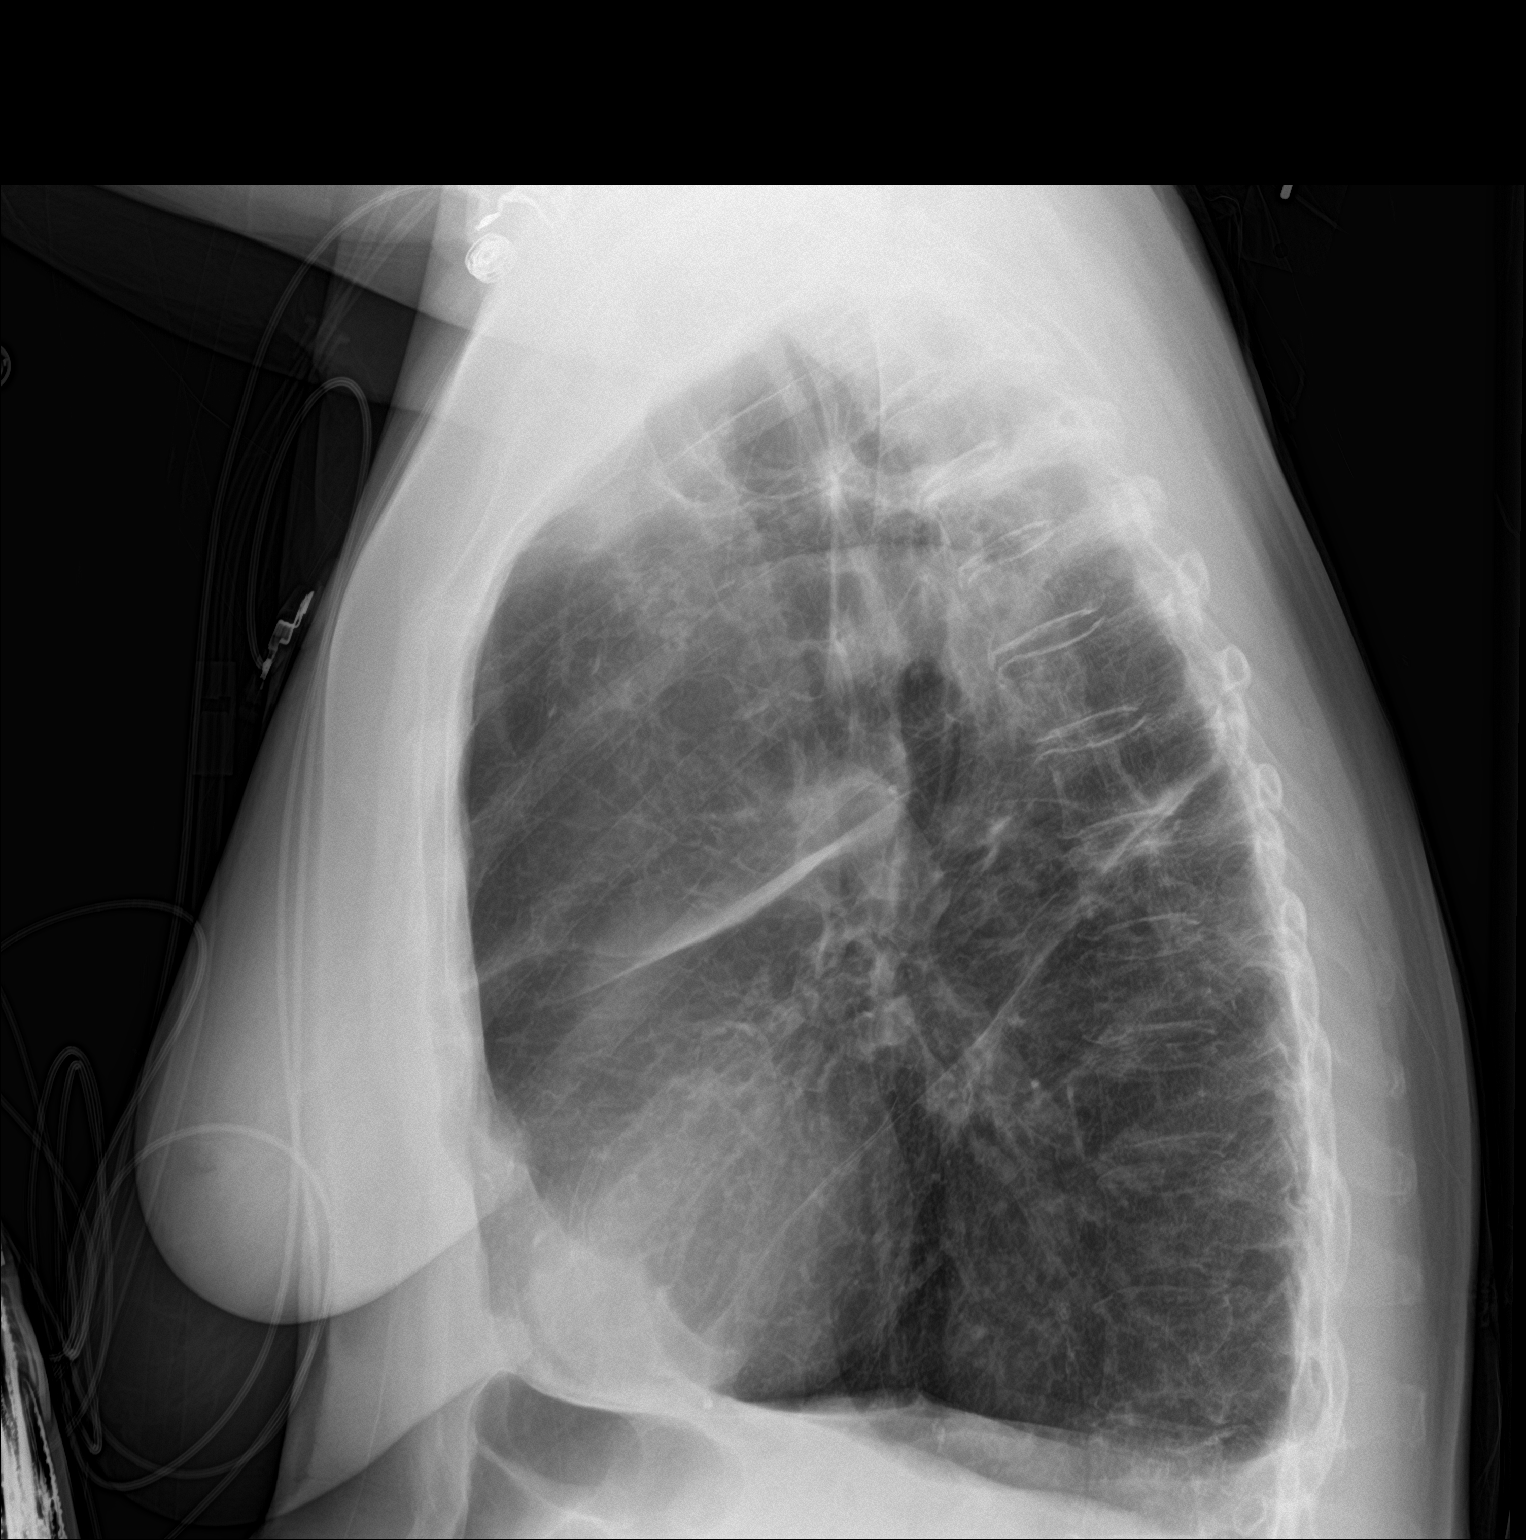

[2 of 2 positions shown; findings below may reference images not displayed]

FINDINGS: The heart size and mediastinal contours are within normal limits.
Severe emphysematous disease is seen in the left upper lobe. Right
upper lobe opacity is noted which most likely represent scarring and
pleural thickening with associated bullous disease, but underlying
acute inflammation or neoplasm can not be excluded. The visualized
skeletal structures are unremarkable.
IMPRESSION: Right upper lobe opacity is noted most consistent with scarring or
pleural thickening, but possible acute inflammation or neoplasm can
not be excluded. CT scan of the chest with contrast administration
is recommended for further evaluation.

Emphysema ([id number]).

## 2022-10-14 ENCOUNTER — Other Ambulatory Visit: Payer: Self-pay

## 2022-10-14 ENCOUNTER — Emergency Department (HOSPITAL_BASED_OUTPATIENT_CLINIC_OR_DEPARTMENT_OTHER)
Admission: EM | Admit: 2022-10-14 | Discharge: 2022-10-14 | Disposition: A | Discharge status: Home / Self Care | Payer: Medicare Other | Attending: Emergency Medicine | Admitting: Emergency Medicine

## 2022-10-14 DIAGNOSIS — H1131 Conjunctival hemorrhage, right eye: Secondary | ICD-10-CM | POA: Insufficient documentation

## 2022-10-14 DIAGNOSIS — H5711 Ocular pain, right eye: Secondary | ICD-10-CM | POA: Diagnosis present

## 2022-10-14 MED ORDER — ERYTHROMYCIN 5 MG/GM OP OINT: TOPICAL_OINTMENT | OPHTHALMIC | 0 refills | Status: DC

## 2022-10-14 NOTE — ED Triage Notes (Signed)
C/O right eye redness on sclera, denies any injury other than rubbing it.

## 2022-10-14 NOTE — ED Provider Notes (Signed)
[place] [facility] Provider Note   CSN: [id number] Arrival date & time: 10/14/22  1814     History Chief Complaint  Patient presents with   Eye Pain    HPI Kristy Weeks is a 72 y.o. female presenting for chief complaint of discoloration of her right eye.  States that she was up late last night watching TV and when she woke up this morning she had a substantial amount of red discoloration around her right eye.  Denies any visual symptoms denies any pain denies any other symptoms at this time.  Recently changed from alternative anticoagulation to Plavix.  Denies fevers chills nausea vomiting syncope shortness of breath.  Otherwise ambulatory tolerating p.o. intake on arrival..   Patient's recorded medical, surgical, social, medication list and allergies were reviewed in the Snapshot window as part of the initial history.   Review of Systems   Review of Systems  Constitutional:  Negative for chills and fever.  HENT:  Negative for ear pain and sore throat.   Eyes:  Positive for redness. Negative for pain and visual disturbance.  Respiratory:  Negative for cough and shortness of breath.   Cardiovascular:  Negative for chest pain and palpitations.  Gastrointestinal:  Negative for abdominal pain and vomiting.  Genitourinary:  Negative for dysuria and hematuria.  Musculoskeletal:  Negative for arthralgias and back pain.  Skin:  Negative for color change and rash.  Neurological:  Negative for seizures and syncope.  All other systems reviewed and are negative.   Physical Exam Updated Vital Signs BP 138/83 (BP Location: Left Arm)   Pulse 85   Temp 97.8 F (36.6 C)   Resp 20   Ht 5\' 4"  (1.626 m)   Wt 45.4 kg   SpO2 91%   BMI 17.16 kg/m  Physical Exam Vitals and nursing note reviewed.  Constitutional:      General: She is not in acute distress.    Appearance: She is well-developed.  HENT:     Head: Normocephalic and atraumatic.      Comments: Substantial right eye subconjunctival hemorrhage.  Visual acuity intact.  No other extraocular motion discomfort or abnormality Eyes:     Conjunctiva/sclera: Conjunctivae normal.  Cardiovascular:     Rate and Rhythm: Normal rate and regular rhythm.     Heart sounds: No murmur heard. Pulmonary:     Effort: Pulmonary effort is normal. No respiratory distress.     Breath sounds: Normal breath sounds.  Abdominal:     General: There is no distension.     Palpations: Abdomen is soft.     Tenderness: There is no abdominal tenderness. There is no right CVA tenderness or left CVA tenderness.  Musculoskeletal:        General: No swelling or tenderness. Normal range of motion.     Cervical back: Neck supple.  Skin:    General: Skin is warm and dry.  Neurological:     General: No focal deficit present.     Mental Status: She is alert and oriented to person, place, and time. Mental status is at baseline.     Cranial Nerves: No cranial nerve deficit.      ED Course/ Medical Decision Making/ A&P    Procedures Procedures   Medications Ordered in ED Medications - No data to display  Medical Decision Making:   Well-appearing 72 year old female presenting with a chief complaint of subconjunctival hemorrhage of the right eye.  No evidence of orbital trauma  abnormality.  Visual acuity is intact extraocular range of motion is intact. She is ambulatory tolerating p.o. intake.  This is likely from her recent initiation of Plavix and her described eyestrain from last night staying watching television. No evidence of more sinister pathology though open globe considered, retinopathy considered such as retinal detachment or vitreous hemorrhage.  Will treat empirically for now with ophthalmologic ointment for lubrication with plan to follow-up with ophthalmology in outpatient setting. Clinical Impression:  1. Subconjunctival hemorrhage of right eye      Discharge   Final Clinical  Impression(s) / ED Diagnoses Final diagnoses:  Subconjunctival hemorrhage of right eye    Rx / DC Orders ED Discharge Orders          Ordered    erythromycin ophthalmic ointment        10/14/22 1952              Glyn Ade, MD 10/14/22 1953

## 2023-01-18 ENCOUNTER — Emergency Department (HOSPITAL_BASED_OUTPATIENT_CLINIC_OR_DEPARTMENT_OTHER): Payer: Medicare Other

## 2023-01-18 ENCOUNTER — Encounter (HOSPITAL_BASED_OUTPATIENT_CLINIC_OR_DEPARTMENT_OTHER): Payer: Self-pay | Admitting: Emergency Medicine

## 2023-01-18 ENCOUNTER — Emergency Department (HOSPITAL_BASED_OUTPATIENT_CLINIC_OR_DEPARTMENT_OTHER)
Admission: EM | Admit: 2023-01-18 | Discharge: 2023-01-19 | Payer: Medicare Other | Attending: Emergency Medicine | Admitting: Emergency Medicine

## 2023-01-18 ENCOUNTER — Other Ambulatory Visit: Payer: Self-pay

## 2023-01-18 DIAGNOSIS — I10 Essential (primary) hypertension: Secondary | ICD-10-CM | POA: Insufficient documentation

## 2023-01-18 DIAGNOSIS — J9620 Acute and chronic respiratory failure, unspecified whether with hypoxia or hypercapnia: Secondary | ICD-10-CM | POA: Diagnosis not present

## 2023-01-18 DIAGNOSIS — R791 Abnormal coagulation profile: Secondary | ICD-10-CM | POA: Insufficient documentation

## 2023-01-18 DIAGNOSIS — Z79899 Other long term (current) drug therapy: Secondary | ICD-10-CM | POA: Insufficient documentation

## 2023-01-18 DIAGNOSIS — Z7901 Long term (current) use of anticoagulants: Secondary | ICD-10-CM | POA: Diagnosis not present

## 2023-01-18 DIAGNOSIS — R0602 Shortness of breath: Secondary | ICD-10-CM | POA: Diagnosis present

## 2023-01-18 DIAGNOSIS — Z1152 Encounter for screening for COVID-19: Secondary | ICD-10-CM | POA: Diagnosis not present

## 2023-01-18 DIAGNOSIS — J441 Chronic obstructive pulmonary disease with (acute) exacerbation: Secondary | ICD-10-CM | POA: Insufficient documentation

## 2023-01-18 DIAGNOSIS — I2510 Atherosclerotic heart disease of native coronary artery without angina pectoris: Secondary | ICD-10-CM | POA: Diagnosis not present

## 2023-01-18 LAB — COMPREHENSIVE METABOLIC PANEL
ALT: 16 U/L (ref 0–44)
AST: 17 U/L (ref 15–41)
Albumin: 3.6 g/dL (ref 3.5–5.0)
Alkaline Phosphatase: 61 U/L (ref 38–126)
Anion gap: 10 (ref 5–15)
BUN: 18 mg/dL (ref 8–23)
CO2: 32 mmol/L (ref 22–32)
Calcium: 9.2 mg/dL (ref 8.9–10.3)
Chloride: 93 mmol/L — ABNORMAL LOW (ref 98–111)
Creatinine, Ser: 0.65 mg/dL (ref 0.44–1.00)
GFR, Estimated: >60 mL/min (ref >60)
Glucose, Bld: 86 mg/dL (ref 70–99)
Potassium: 4.4 mmol/L (ref 3.5–5.1)
Sodium: 135 mmol/L (ref 135–145)
Total Bilirubin: 0.9 mg/dL (ref <1.2)
Total Protein: 6.9 g/dL (ref 6.5–8.1)

## 2023-01-18 LAB — CBC WITH DIFFERENTIAL/PLATELET
Abs Immature Granulocytes: 0.02 10*3/uL (ref 0.00–0.07)
Basophils Absolute: 0 10*3/uL (ref 0.0–0.1)
Basophils Relative: 0 %
Eosinophils Absolute: 0 10*3/uL (ref 0.0–0.5)
Eosinophils Relative: 0 %
HCT: 34.3 % — ABNORMAL LOW (ref 36.0–46.0)
Hemoglobin: 10.9 g/dL — ABNORMAL LOW (ref 12.0–15.0)
Immature Granulocytes: 0 %
Lymphocytes Relative: 15 %
Lymphs Abs: 1 10*3/uL (ref 0.7–4.0)
MCH: 30.4 pg (ref 26.0–34.0)
MCHC: 31.8 g/dL (ref 30.0–36.0)
MCV: 95.5 fL (ref 80.0–100.0)
Monocytes Absolute: 0.5 10*3/uL (ref 0.1–1.0)
Monocytes Relative: 7 %
Neutro Abs: 5.4 10*3/uL (ref 1.7–7.7)
Neutrophils Relative %: 78 %
Platelets: 211 10*3/uL (ref 150–400)
RBC: 3.59 MIL/uL — ABNORMAL LOW (ref 3.87–5.11)
RDW: 11.8 % (ref 11.5–15.5)
WBC: 6.9 10*3/uL (ref 4.0–10.5)
nRBC: 0 % (ref 0.0–0.2)

## 2023-01-18 LAB — D-DIMER, QUANTITATIVE: D-Dimer, Quant: 2.21 ug{FEU}/mL — ABNORMAL HIGH (ref 0.00–0.50)

## 2023-01-18 LAB — RESP PANEL BY RT-PCR (RSV, FLU A&B, COVID)  RVPGX2
Influenza A by PCR: NEGATIVE
Influenza B by PCR: NEGATIVE
Resp Syncytial Virus by PCR: NEGATIVE
SARS Coronavirus 2 by RT PCR: NEGATIVE

## 2023-01-18 MED ORDER — MAGNESIUM SULFATE 2 GM/50ML IV SOLN
2.0000 g | Freq: Once | INTRAVENOUS | Status: AC
  Administered 2023-01-18: 2 g via INTRAVENOUS
  Filled 2023-01-18: qty 50

## 2023-01-18 MED ORDER — ALBUTEROL SULFATE HFA 108 (90 BASE) MCG/ACT IN AERS: 2.0000 | INHALATION_SPRAY | RESPIRATORY_TRACT | Status: DC | PRN

## 2023-01-18 MED ORDER — ALBUTEROL SULFATE (2.5 MG/3ML) 0.083% IN NEBU
2.5000 mg | INHALATION_SOLUTION | Freq: Once | RESPIRATORY_TRACT | Status: AC
  Administered 2023-01-18: 2.5 mg via RESPIRATORY_TRACT
  Filled 2023-01-18: qty 3

## 2023-01-18 MED ORDER — METHYLPREDNISOLONE SODIUM SUCC 125 MG IJ SOLR
125.0000 mg | Freq: Once | INTRAMUSCULAR | Status: AC
  Administered 2023-01-18: 125 mg via INTRAVENOUS
  Filled 2023-01-18: qty 2

## 2023-01-18 MED ORDER — TRAMADOL HCL 50 MG PO TABS
50.0000 mg | ORAL_TABLET | Freq: Once | ORAL | Status: AC
  Administered 2023-01-18: 50 mg via ORAL
  Filled 2023-01-18: qty 1

## 2023-01-18 MED ORDER — ALBUTEROL SULFATE (2.5 MG/3ML) 0.083% IN NEBU
5.0000 mg | INHALATION_SOLUTION | Freq: Once | RESPIRATORY_TRACT | Status: AC
  Administered 2023-01-18: 5 mg via RESPIRATORY_TRACT
  Filled 2023-01-18: qty 6

## 2023-01-18 MED ORDER — IPRATROPIUM-ALBUTEROL 0.5-2.5 (3) MG/3ML IN SOLN
3.0000 mL | Freq: Once | RESPIRATORY_TRACT | Status: AC
  Administered 2023-01-18: 3 mL via RESPIRATORY_TRACT
  Filled 2023-01-18: qty 3

## 2023-01-18 MED ORDER — HYDROCODONE-ACETAMINOPHEN 5-325 MG PO TABS
1.0000 | ORAL_TABLET | Freq: Once | ORAL | Status: DC
  Filled 2023-01-18: qty 1

## 2023-01-18 MED ORDER — IOHEXOL 350 MG/ML SOLN: 75.0000 mL | Freq: Once | INTRAVENOUS | Status: DC | PRN

## 2023-01-18 NOTE — ED Provider Notes (Signed)
[place] [facility] Provider Note   CSN: [id number] Arrival date & time: 01/18/23  1818     History {Add pertinent medical, surgical, social history, OB history to HPI:1} Chief Complaint  Patient presents with  . Shortness of Breath    Kristy Weeks is a 72 y.o. female.       Home Medications Prior to Admission medications   Medication Sig Start Date End Date Taking? Authorizing Provider  albuterol (PROVENTIL) (2.5 MG/3ML) 0.083% nebulizer solution Take 2.5 mg by nebulization every 6 (six) hours as needed for wheezing or shortness of breath.    [provider]  Albuterol Sulfate 108 (90 Base) MCG/ACT AEPB Inhale 2-6 puffs into the lungs daily.     [provider]  aspirin EC 81 MG tablet Take 81 mg by mouth daily.    [provider]  atorvastatin (LIPITOR) 40 MG tablet Take 40 mg by mouth at bedtime.    [provider]  carvedilol (COREG) 6.25 MG tablet Take 6.25 mg by mouth 2 (two) times daily with a meal.    [provider]  clopidogrel (PLAVIX) 75 MG tablet Take 75 mg by mouth daily.    [provider]  erythromycin ophthalmic ointment Place a 1/2 inch ribbon of ointment into the lower eyelid. 10/14/22   Glyn Ade, MD  fluticasone furoate-vilanterol (BREO ELLIPTA) 100-25 MCG/INH AEPB Inhale 1 puff into the lungs daily.    [provider]  ipratropium-albuterol (DUONEB) 0.5-2.5 (3) MG/3ML SOLN Take 3 mLs by nebulization every 6 (six) hours as needed (for shortness of breath).     [provider]  lisinopril (PRINIVIL,ZESTRIL) 10 MG tablet Take 10 mg by mouth at bedtime.    [provider]  tiotropium (SPIRIVA) 18 MCG inhalation capsule Place 18 mcg into inhaler and inhale daily.    [provider]  traMADol (ULTRAM) 50 MG tablet Take 1 tablet (50 mg total) by mouth every 12 (twelve) hours as needed for up to 10 doses for moderate pain or  severe pain. 01/28/18   Miguel Rota, MD      Allergies    Cefdinir    Review of Systems   Review of Systems  Respiratory:  Positive for shortness of breath.     Physical Exam Updated Vital Signs BP (!) 155/90   Temp (!) 97.5 F (36.4 C)   Resp (!) 28   Wt 46 kg   SpO2 96%   BMI 17.41 kg/m  Physical Exam  ED Results / Procedures / Treatments   Labs (all labs ordered are listed, but only abnormal results are displayed) Labs Reviewed  CBC WITH DIFFERENTIAL/PLATELET - Abnormal; Notable for the following components:      Result Value   RBC 3.59 (*)    Hemoglobin 10.9 (*)    HCT 34.3 (*)    All other components within normal limits  COMPREHENSIVE METABOLIC PANEL - Abnormal; Notable for the following components:   Chloride 93 (*)    All other components within normal limits  RESP PANEL BY RT-PCR (RSV, FLU A&B, COVID)  [id number]    EKG None  Radiology DG Chest Portable 1 View Result Date: 01/18/2023 CLINICAL DATA:  Shortness of breath, COPD. EXAM: PORTABLE CHEST 1 VIEW COMPARISON:  09/09/2022, CT 09/09/2022 FINDINGS: Lungs are somewhat hyperexpanded with moderate emphysematous disease unchanged. Moderate scarring over the right suprahilar and paramediastinal region unchanged. Known cavitary process over the right upper lobe/apex unchanged. Possible small amount  of right pleural fluid/basilar atelectasis without significant change. Left lung essentially clear. Cardiomediastinal silhouette and remainder of the exam is unchanged. IMPRESSION: 1. No acute findings. 2. Moderate emphysematous disease with moderate scarring over the right suprahilar and paramediastinal region. 3. Known cavitary process over the right upper lobe/apex unchanged. Electronically Signed   By: Elberta Fortis M.D.   On: 01/18/2023 20:07    Procedures Procedures  {Document cardiac monitor, telemetry assessment procedure when appropriate:1}  Medications Ordered in ED Medications  albuterol (VENTOLIN HFA) 108  (90 Base) MCG/ACT inhaler 2 puff (has no administration in time range)  ipratropium-albuterol (DUONEB) 0.5-2.5 (3) MG/3ML nebulizer solution 3 mL (3 mLs Nebulization Given 01/18/23 1840)  albuterol (PROVENTIL) (2.5 MG/3ML) 0.083% nebulizer solution 2.5 mg (2.5 mg Nebulization Given 01/18/23 1840)  methylPREDNISolone sodium succinate (SOLU-MEDROL) 125 mg/2 mL injection 125 mg (125 mg Intravenous Given 01/18/23 1853)  traMADol (ULTRAM) tablet 50 mg (50 mg Oral Given 01/18/23 2050)  ipratropium-albuterol (DUONEB) 0.5-2.5 (3) MG/3ML nebulizer solution 3 mL (3 mLs Nebulization Given 01/18/23 2102)    ED Course/ Medical Decision Making/ A&P   {   Click here for ABCD2, HEART and other calculatorsREFRESH Note before signing :1}                              Medical Decision Making Amount and/or Complexity of Data Reviewed Labs: ordered. Radiology: ordered.  Risk Prescription drug management.   ***  {Document critical care time when appropriate:1} {Document review of labs and clinical decision tools ie heart score, Chads2Vasc2 etc:1}  {Document your independent review of radiology images, and any outside records:1} {Document your discussion with family members, caretakers, and with consultants:1} {Document social determinants of health affecting pt's care:1} {Document your decision making why or why not admission, treatments were needed:1} Final Clinical Impression(s) / ED Diagnoses Final diagnoses:  None    Rx / DC Orders ED Discharge Orders     None

## 2023-01-18 NOTE — ED Triage Notes (Signed)
Shortness of breath , Home oxygen 2 L [place] , she said she had to increase her needs to 4 L [place] prior to arrival . Denies cough or illness. Hx COPD .  Obvious resp distress. 84 % on 4 L [place]

## 2023-01-18 NOTE — ED Provider Notes (Incomplete)
[place] [facility] Provider Note   CSN: [id number] Arrival date & time: 01/18/23  1818     History {Add pertinent medical, surgical, social history, OB history to HPI:1} Chief Complaint  Patient presents with  . Shortness of Breath    Kristy Weeks is a 72 y.o. female with past medical history significant for CAD, COPD, chronic respiratory failure with chronic oxygen use, hypertension presents to the ED complaining of shortness of breath.  Patient states that her symptoms started yesterday but became significantly worse today.  Patient is normally on 3 L via nasal cannula but had to increase to 4L [place] prior to arrival.  Denies cough, congestion, chest pain, fever.       Home Medications Prior to Admission medications   Medication Sig Start Date End Date Taking? Authorizing Provider  albuterol (PROVENTIL) (2.5 MG/3ML) 0.083% nebulizer solution Take 2.5 mg by nebulization every 6 (six) hours as needed for wheezing or shortness of breath.    [provider]  Albuterol Sulfate 108 (90 Base) MCG/ACT AEPB Inhale 2-6 puffs into the lungs daily.     [provider]  aspirin EC 81 MG tablet Take 81 mg by mouth daily.    [provider]  atorvastatin (LIPITOR) 40 MG tablet Take 40 mg by mouth at bedtime.    [provider]  carvedilol (COREG) 6.25 MG tablet Take 6.25 mg by mouth 2 (two) times daily with a meal.    [provider]  clopidogrel (PLAVIX) 75 MG tablet Take 75 mg by mouth daily.    [provider]  erythromycin ophthalmic ointment Place a 1/2 inch ribbon of ointment into the lower eyelid. 10/14/22   Glyn Ade, MD  fluticasone furoate-vilanterol (BREO ELLIPTA) 100-25 MCG/INH AEPB Inhale 1 puff into the lungs daily.    [provider]  ipratropium-albuterol (DUONEB) 0.5-2.5 (3) MG/3ML SOLN Take 3 mLs by nebulization every 6 (six) hours as needed (for shortness of breath).      [provider]  lisinopril (PRINIVIL,ZESTRIL) 10 MG tablet Take 10 mg by mouth at bedtime.    [provider]  tiotropium (SPIRIVA) 18 MCG inhalation capsule Place 18 mcg into inhaler and inhale daily.    [provider]  traMADol (ULTRAM) 50 MG tablet Take 1 tablet (50 mg total) by mouth every 12 (twelve) hours as needed for up to 10 doses for moderate pain or severe pain. 01/28/18   Miguel Rota, MD      Allergies    Cefdinir    Review of Systems   Review of Systems  Constitutional:  Negative for fever.  HENT:  Negative for congestion.   Respiratory:  Positive for chest tightness and shortness of breath. Negative for cough.   Cardiovascular:  Negative for chest pain.    Physical Exam Updated Vital Signs BP (!) 155/90   Temp (!) 97.5 F (36.4 C)   Resp (!) 28   Wt 46 kg   SpO2 96%   BMI 17.41 kg/m  Physical Exam Vitals and nursing note reviewed.  Constitutional:      General: She is not in acute distress.    Appearance: Normal appearance. She is ill-appearing. She is not diaphoretic.     Interventions: Nasal cannula in place.     Comments: On 6L [place] upon initial assessment  Cardiovascular:     Rate and Rhythm: Normal rate and regular rhythm.  Pulmonary:     Effort: Tachypnea, accessory muscle  usage and respiratory distress present.     Breath sounds: Decreased air movement present. No wheezing.     Comments: No audible breath sounds in the upper or lower left lobes.  Right lower lobe with diminished breath sounds.   Skin:    General: Skin is warm and dry.     Capillary Refill: Capillary refill takes less than 2 seconds.  Neurological:     Mental Status: She is alert. Mental status is at baseline.  Psychiatric:        Mood and Affect: Mood normal.        Behavior: Behavior normal.     ED Results / Procedures / Treatments   Labs (all labs ordered are listed, but only abnormal results are displayed) Labs Reviewed  CBC WITH  DIFFERENTIAL/PLATELET - Abnormal; Notable for the following components:      Result Value   RBC 3.59 (*)    Hemoglobin 10.9 (*)    HCT 34.3 (*)    All other components within normal limits  COMPREHENSIVE METABOLIC PANEL - Abnormal; Notable for the following components:   Chloride 93 (*)    All other components within normal limits  RESP PANEL BY RT-PCR (RSV, FLU A&B, COVID)  [id number]    EKG None  Radiology DG Chest Portable 1 View Result Date: 01/18/2023 CLINICAL DATA:  Shortness of breath, COPD. EXAM: PORTABLE CHEST 1 VIEW COMPARISON:  09/09/2022, CT 09/09/2022 FINDINGS: Lungs are somewhat hyperexpanded with moderate emphysematous disease unchanged. Moderate scarring over the right suprahilar and paramediastinal region unchanged. Known cavitary process over the right upper lobe/apex unchanged. Possible small amount of right pleural fluid/basilar atelectasis without significant change. Left lung essentially clear. Cardiomediastinal silhouette and remainder of the exam is unchanged. IMPRESSION: 1. No acute findings. 2. Moderate emphysematous disease with moderate scarring over the right suprahilar and paramediastinal region. 3. Known cavitary process over the right upper lobe/apex unchanged. Electronically Signed   By: Elberta Fortis M.D.   On: 01/18/2023 20:07    Procedures Procedures  {Document cardiac monitor, telemetry assessment procedure when appropriate:1}  Medications Ordered in ED Medications  albuterol (VENTOLIN HFA) 108 (90 Base) MCG/ACT inhaler 2 puff (has no administration in time range)  ipratropium-albuterol (DUONEB) 0.5-2.5 (3) MG/3ML nebulizer solution 3 mL (3 mLs Nebulization Given 01/18/23 1840)  albuterol (PROVENTIL) (2.5 MG/3ML) 0.083% nebulizer solution 2.5 mg (2.5 mg Nebulization Given 01/18/23 1840)  methylPREDNISolone sodium succinate (SOLU-MEDROL) 125 mg/2 mL injection 125 mg (125 mg Intravenous Given 01/18/23 1853)  traMADol (ULTRAM) tablet 50 mg (50 mg Oral  Given 01/18/23 2050)  ipratropium-albuterol (DUONEB) 0.5-2.5 (3) MG/3ML nebulizer solution 3 mL (3 mLs Nebulization Given 01/18/23 2102)    ED Course/ Medical Decision Making/ A&P   {   Click here for ABCD2, HEART and other calculatorsREFRESH Note before signing :1}                              Medical Decision Making Amount and/or Complexity of Data Reviewed Labs: ordered. Radiology: ordered.  Risk Prescription drug management.   This patient presents to the ED with chief complaint(s) of shortness of breath, respiratory distress with pertinent past medical history of COPD, chronic oxygen use.  The complaint involves an extensive differential diagnosis and also carries with it a high risk of complications and morbidity.    The differential diagnosis includes COPD exacerbation, PE,    The initial plan is to obtain labs, give breathing treatments, obtain  chest x-ray  Additional history obtained: Records reviewed previous admission documents  Initial Assessment:   Exam significant for ill-appearing patient who is in respiratory distress.  She is unable to speak more than 1-2 words at a time.  She has accessory muscle usage and is tachypneic.  She is currently on 5-6 L via nasal cannula.  Baseline is 3L [place].  No audible breath sounds in the upper or lower left lobes.  Right lower lobe with diminished breath sounds.  No auscultated wheezing.  Independent ECG/labs interpretation:  The following labs were independently interpreted:  Negative for COVID, influenza, and RSV CBC without leukocytosis.  Mild anemia. Metabolic panel without major electrolyte disturbance.  Anion gap is normal.  Renal hepatic function both normal.  Dr. Dalene Seltzer evaluated patient independently and feels that due to sudden onset of symptoms that PE cannot be ruled out in this patient.  Initially was going to do CTA, but patient nervous about lying flat.  D-dimer was ordered.  D-dimer returned positive at 2.21.   Will obtain CTA.  Independent visualization and interpretation of imaging: I independently visualized the following imaging with scope of interpretation limited to determining acute life threatening conditions related to emergency care: Chest x-ray, which revealed no evidence of pneumonia.  Emphysematous changes.  Treatment and Reassessment: ***  Consultations obtained:   ***  Disposition:   ***  Social Determinants of Health:   Patient's {[id number]}  increases the complexity of managing their presentation   {Document critical care time when appropriate:1} {Document review of labs and clinical decision tools ie heart score, Chads2Vasc2 etc:1}  {Document your independent review of radiology images, and any outside records:1} {Document your discussion with family members, caretakers, and with consultants:1} {Document social determinants of health affecting pt's care:1} {Document your decision making why or why not admission, treatments were needed:1} Final Clinical Impression(s) / ED Diagnoses Final diagnoses:  None    Rx / DC Orders ED Discharge Orders     None

## 2023-01-19 ENCOUNTER — Emergency Department (HOSPITAL_BASED_OUTPATIENT_CLINIC_OR_DEPARTMENT_OTHER): Payer: Medicare Other

## 2023-01-19 DIAGNOSIS — J441 Chronic obstructive pulmonary disease with (acute) exacerbation: Secondary | ICD-10-CM | POA: Diagnosis not present

## 2023-01-19 MED ORDER — SODIUM CHLORIDE 0.9 % IV SOLN
500.0000 mg | Freq: Once | INTRAVENOUS | Status: AC
  Administered 2023-01-19: 500 mg via INTRAVENOUS
  Filled 2023-01-19: qty 5

## 2023-01-19 MED ORDER — IOHEXOL 350 MG/ML SOLN
75.0000 mL | Freq: Once | INTRAVENOUS | Status: AC | PRN
  Administered 2023-01-19: 75 mL via INTRAVENOUS

## 2023-01-19 MED ORDER — DIAZEPAM 5 MG/ML IJ SOLN
2.0000 mg | Freq: Once | INTRAMUSCULAR | Status: AC
  Administered 2023-01-19: 2 mg via INTRAVENOUS
  Filled 2023-01-19: qty 2

## 2023-01-19 MED ORDER — IPRATROPIUM-ALBUTEROL 0.5-2.5 (3) MG/3ML IN SOLN
3.0000 mL | Freq: Once | RESPIRATORY_TRACT | Status: AC
  Administered 2023-01-19: 3 mL via RESPIRATORY_TRACT
  Filled 2023-01-19: qty 3

## 2023-01-19 MED ORDER — CEFTRIAXONE SODIUM 1 G IJ SOLR
1.0000 g | Freq: Once | INTRAMUSCULAR | Status: AC
  Administered 2023-01-19: 1 g via INTRAVENOUS
  Filled 2023-01-19: qty 10

## 2023-01-19 NOTE — ED Provider Notes (Signed)
I assumed care of this patient from previous provider.  Please see their note for further details of history, exam, and MDM.   Briefly patient is a 72 y.o. female who presented for SOB. Favored COPD exacerbation and treated as such. Ruling out PE. Dimer +,  pending CTA.  Admitted to [facility] and accepted by Dr. Romelle Starcher.  CTA negative for PE. Noted to have PNA. Started on Abx.      Nira Conn, MD 01/19/23 [phone]

## 2023-01-19 NOTE — ED Notes (Signed)
ED TO INPATIENT HANDOFF REPORT  ED Nurse Name and Phone #: Gorden Harms, RN  S Name/Age/Gender Kristy Weeks 72 y.o. female Room/Bed: MH02/MH02  Code Status   Code Status: Prior  Home/SNF/Other Home Patient oriented to: self, place, time, and situation Is this baseline? Yes   Triage Complete: Triage complete  Chief Complaint COPD  Triage Note Shortness of breath , Home oxygen 2 L [place] , she said she had to increase her needs to 4 L [place] prior to arrival . Denies cough or illness. Hx COPD .  Obvious resp distress. 84 % on 4 L     Allergies Allergies  Allergen Reactions   Cefdinir Other (See Comments)    Abdominal pain    Level of Care/Admitting Diagnosis ED Disposition     None       B Medical/Surgery History Past Medical History:  Diagnosis Date   COPD (chronic obstructive pulmonary disease) (HCC)    wears 2L most of the time   Coronary artery disease    Hypertension    Past Surgical History:  Procedure Laterality Date   CORONARY ANGIOPLASTY WITH STENT PLACEMENT       A IV Location/Drains/Wounds Patient Lines/Drains/Airways Status     Active Line/Drains/Airways     Name Placement date Placement time Site Days   Peripheral IV 01/18/23 20 G Right Antecubital 01/18/23  1853  Antecubital  1            Intake/Output Last 24 hours No intake or output data in the 24 hours ending 01/19/23 0137  Labs/Imaging Results for orders placed or performed during the hospital encounter of 01/18/23 (from the past 48 hours)  CBC with Differential     Status: Abnormal   Collection Time: 01/18/23  6:44 PM  Result Value Ref Range   WBC 6.9 4.0 - 10.5 K/uL   RBC 3.59 (L) 3.87 - 5.11 MIL/uL   Hemoglobin 10.9 (L) 12.0 - 15.0 g/dL   HCT 41.3 (L) 24.4 - 01.0 %   MCV 95.5 80.0 - 100.0 fL   MCH 30.4 26.0 - 34.0 pg   MCHC 31.8 30.0 - 36.0 g/dL   RDW 27.2 53.6 - 64.4 %   Platelets 211 150 - 400 K/uL   nRBC 0.0 0.0 - 0.2 %   Neutrophils Relative % 78 %    Neutro Abs 5.4 1.7 - 7.7 K/uL   Lymphocytes Relative 15 %   Lymphs Abs 1.0 0.7 - 4.0 K/uL   Monocytes Relative 7 %   Monocytes Absolute 0.5 0.1 - 1.0 K/uL   Eosinophils Relative 0 %   Eosinophils Absolute 0.0 0.0 - 0.5 K/uL   Basophils Relative 0 %   Basophils Absolute 0.0 0.0 - 0.1 K/uL   Immature Granulocytes 0 %   Abs Immature Granulocytes 0.02 0.00 - 0.07 K/uL    Comment: Performed at [facility], [address] [place], [place] [address]  Comprehensive metabolic panel     Status: Abnormal   Collection Time: 01/18/23  6:44 PM  Result Value Ref Range   Sodium 135 135 - 145 mmol/L   Potassium 4.4 3.5 - 5.1 mmol/L   Chloride 93 (L) 98 - 111 mmol/L   CO2 32 22 - 32 mmol/L   Glucose, Bld 86 70 - 99 mg/dL    Comment: Glucose reference range applies only to samples taken after fasting for at least 8 hours.   BUN 18 8 - 23 mg/dL   Creatinine, Ser 2.59  0.44 - 1.00 mg/dL   Calcium 9.2 8.9 - 78.4 mg/dL   Total Protein 6.9 6.5 - 8.1 g/dL   Albumin 3.6 3.5 - 5.0 g/dL   AST 17 15 - 41 U/L   ALT 16 0 - 44 U/L   Alkaline Phosphatase 61 38 - 126 U/L   Total Bilirubin 0.9 <1.2 mg/dL   GFR, Estimated >69 >62 mL/min    Comment: (NOTE) Calculated using the CKD-EPI Creatinine Equation (2021)    Anion gap 10 5 - 15    Comment: Performed at [facility], [address] [place], [place] [address]  Resp panel by RT-PCR (RSV, Flu A&B, Covid) Anterior Nasal Swab     Status: None   Collection Time: 01/18/23  6:51 PM   Specimen: Anterior Nasal Swab  Result Value Ref Range   SARS Coronavirus 2 by RT PCR NEGATIVE NEGATIVE    Comment: (NOTE) SARS-CoV-2 target nucleic acids are NOT DETECTED.  The SARS-CoV-2 RNA is generally detectable in upper respiratory specimens during the acute phase of infection. The lowest concentration of SARS-CoV-2 viral copies this assay can detect is 138 copies/mL. A negative result does not preclude SARS-Cov-2 infection and should not be used as  the sole basis for treatment or other patient management decisions. A negative result may occur with  improper specimen collection/handling, submission of specimen other than nasopharyngeal swab, presence of viral mutation(s) within the areas targeted by this assay, and inadequate number of viral copies(<138 copies/mL). A negative result must be combined with clinical observations, patient history, and epidemiological information. The expected result is Negative.  Fact Sheet for Patients:  BloggerCourse.com  Fact Sheet for Healthcare Providers:  SeriousBroker.it  This test is no t yet approved or cleared by the [place] FDA and  has been authorized for detection and/or diagnosis of SARS-CoV-2 by FDA under an Emergency Use Authorization (EUA). This EUA will remain  in effect (meaning this test can be used) for the duration of the COVID-19 declaration under Section 564(b)(1) of the Act, 21 U.S.C.section 360bbb-3(b)(1), unless the authorization is terminated  or revoked sooner.       Influenza A by PCR NEGATIVE NEGATIVE   Influenza B by PCR NEGATIVE NEGATIVE    Comment: (NOTE) The Xpert Xpress SARS-CoV-2/FLU/RSV plus assay is intended as an aid in the diagnosis of influenza from Nasopharyngeal swab specimens and should not be used as a sole basis for treatment. Nasal washings and aspirates are unacceptable for Xpert Xpress SARS-CoV-2/FLU/RSV testing.  Fact Sheet for Patients: BloggerCourse.com  Fact Sheet for Healthcare Providers: SeriousBroker.it  This test is not yet approved or cleared by the [place] FDA and has been authorized for detection and/or diagnosis of SARS-CoV-2 by FDA under an Emergency Use Authorization (EUA). This EUA will remain in effect (meaning this test can be used) for the duration of the COVID-19 declaration under Section 564(b)(1) of the Act, 21  U.S.C. section 360bbb-3(b)(1), unless the authorization is terminated or revoked.     Resp Syncytial Virus by PCR NEGATIVE NEGATIVE    Comment: (NOTE) Fact Sheet for Patients: BloggerCourse.com  Fact Sheet for Healthcare Providers: SeriousBroker.it  This test is not yet approved or cleared by the [place] FDA and has been authorized for detection and/or diagnosis of SARS-CoV-2 by FDA under an Emergency Use Authorization (EUA). This EUA will remain in effect (meaning this test can be used) for the duration of the COVID-19 declaration under Section 564(b)(1) of the Act, 21 U.S.C. section  360bbb-3(b)(1), unless the authorization is terminated or revoked.  Performed at [facility], [address] [place], [place] [address]   D-dimer, quantitative     Status: Abnormal   Collection Time: 01/18/23 11:12 PM  Result Value Ref Range   D-Dimer, Quant 2.21 (H) 0.00 - 0.50 ug/mL-FEU    Comment: (NOTE) At the manufacturer cut-off value of 0.5 g/mL FEU, this assay has a negative predictive value of 95-100%.This assay is intended for use in conjunction with a clinical pretest probability (PTP) assessment model to exclude pulmonary embolism (PE) and deep venous thrombosis (DVT) in outpatients suspected of PE or DVT. Results should be correlated with clinical presentation. Performed at [facility], [address], [place], [place] [address]    CT Angio Chest PE W and/or Wo Contrast Result Date: 01/19/2023 CLINICAL DATA:  Pulmonary embolism (PE) suspected, high prob. Shortness of breath. EXAM: CT ANGIOGRAPHY CHEST WITH CONTRAST TECHNIQUE: Multidetector CT imaging of the chest was performed using the standard protocol during bolus administration of intravenous contrast. Multiplanar CT image reconstructions and MIPs were obtained to evaluate the vascular anatomy. RADIATION DOSE REDUCTION: This exam was performed  according to the departmental dose-optimization program which includes automated exposure control, adjustment of the mA and/or kV according to patient size and/or use of iterative reconstruction technique. CONTRAST:  75mL OMNIPAQUE IOHEXOL 350 MG/ML SOLN COMPARISON:  09/09/2022 FINDINGS: Cardiovascular: No filling defects in the pulmonary arteries to suggest pulmonary emboli. Extensive coronary artery and aortic atherosclerosis. Heart is normal size. Aorta is normal caliber. Mediastinum/Nodes: No mediastinal, hilar, or axillary adenopathy. Trachea and esophagus are unremarkable. Thyroid unremarkable. Lungs/Pleura: Chronic thick walled cavity again noted in the right upper lobe, unchanged. New airspace consolidation in the right lower lobe. Small right pleural effusion. Nodularity throughout the right lung is similar to prior study. No confluent opacity on the left. Severe emphysema. Upper Abdomen: No acute findings Musculoskeletal: Chest wall soft tissues are unremarkable. No acute bony abnormality. Review of the MIP images confirms the above findings. IMPRESSION: No evidence of pulmonary embolus. Chronic right apical cavity and nodularity throughout the right lung compatible with history of MAC infection. New area consolidation in the right lower lobe concerning for acute pneumonia. Small right pleural effusion. Diffuse coronary artery disease. Aortic Atherosclerosis (ICD10-I70.0) and Emphysema (ICD10-J43.9). Electronically Signed   By: Charlett Nose M.D.   On: 01/19/2023 01:32   DG Chest Portable 1 View Result Date: 01/18/2023 CLINICAL DATA:  Shortness of breath, COPD. EXAM: PORTABLE CHEST 1 VIEW COMPARISON:  09/09/2022, CT 09/09/2022 FINDINGS: Lungs are somewhat hyperexpanded with moderate emphysematous disease unchanged. Moderate scarring over the right suprahilar and paramediastinal region unchanged. Known cavitary process over the right upper lobe/apex unchanged. Possible small amount of right pleural  fluid/basilar atelectasis without significant change. Left lung essentially clear. Cardiomediastinal silhouette and remainder of the exam is unchanged. IMPRESSION: 1. No acute findings. 2. Moderate emphysematous disease with moderate scarring over the right suprahilar and paramediastinal region. 3. Known cavitary process over the right upper lobe/apex unchanged. Electronically Signed   By: Elberta Fortis M.D.   On: 01/18/2023 20:07    Pending Labs Unresulted Labs (From admission, onward)    None       Vitals/Pain Today's Vitals   01/18/23 2311 01/18/23 2330 01/18/23 2337 01/19/23 0000  BP:      Pulse:    86  Resp:    20  Temp: 97.7 F (36.5 C)     TempSrc: Oral  SpO2:  95%  97%  Weight:      PainSc:   4      Isolation Precautions No active isolations  Medications Medications  albuterol (VENTOLIN HFA) 108 (90 Base) MCG/ACT inhaler 2 puff (has no administration in time range)  iohexol (OMNIPAQUE) 350 MG/ML injection 75 mL (has no administration in time range)  cefTRIAXone (ROCEPHIN) 1 g in sodium chloride 0.9 % 100 mL IVPB (has no administration in time range)  azithromycin (ZITHROMAX) 500 mg in sodium chloride 0.9 % 250 mL IVPB (has no administration in time range)  ipratropium-albuterol (DUONEB) 0.5-2.5 (3) MG/3ML nebulizer solution 3 mL (3 mLs Nebulization Given 01/18/23 1840)  albuterol (PROVENTIL) (2.5 MG/3ML) 0.083% nebulizer solution 2.5 mg (2.5 mg Nebulization Given 01/18/23 1840)  methylPREDNISolone sodium succinate (SOLU-MEDROL) 125 mg/2 mL injection 125 mg (125 mg Intravenous Given 01/18/23 1853)  traMADol (ULTRAM) tablet 50 mg (50 mg Oral Given 01/18/23 2050)  ipratropium-albuterol (DUONEB) 0.5-2.5 (3) MG/3ML nebulizer solution 3 mL (3 mLs Nebulization Given 01/18/23 2102)  albuterol (PROVENTIL) (2.5 MG/3ML) 0.083% nebulizer solution 5 mg (5 mg Nebulization Given 01/18/23 2313)  magnesium sulfate IVPB 2 g 50 mL (2 g Intravenous New Bag/Given 01/18/23 2332)   iohexol (OMNIPAQUE) 350 MG/ML injection 75 mL (75 mLs Intravenous Contrast Given 01/19/23 0125)  diazepam (VALIUM) injection 2 mg (2 mg Intravenous Given 01/19/23 0105)    Mobility walks     Focused Assessments Pulmonary Assessment Handoff:  Lung sounds: Bilateral Breath Sounds: Diminished, Expiratory wheezes O2 Device: Nasal Cannula O2 Flow Rate (L/min): 4 L/min    R Recommendations: See Admitting Provider Note  Report given to: Selena Batten, RN  Additional Notes:

## 2023-01-19 NOTE — ED Notes (Signed)
Patient transported to CT 

## 2023-02-16 ENCOUNTER — Inpatient Hospital Stay (HOSPITAL_BASED_OUTPATIENT_CLINIC_OR_DEPARTMENT_OTHER)
Admission: EM | Admit: 2023-02-16 | Discharge: 2023-02-21 | DRG: 177 | Disposition: A | Discharge status: Home / Self Care | Payer: Medicare Other | Attending: Internal Medicine | Admitting: Internal Medicine

## 2023-02-16 ENCOUNTER — Encounter (HOSPITAL_BASED_OUTPATIENT_CLINIC_OR_DEPARTMENT_OTHER): Payer: Self-pay | Admitting: Radiology

## 2023-02-16 ENCOUNTER — Emergency Department (HOSPITAL_BASED_OUTPATIENT_CLINIC_OR_DEPARTMENT_OTHER): Payer: Medicare Other

## 2023-02-16 ENCOUNTER — Other Ambulatory Visit: Payer: Self-pay

## 2023-02-16 DIAGNOSIS — Z9981 Dependence on supplemental oxygen: Secondary | ICD-10-CM

## 2023-02-16 DIAGNOSIS — J441 Chronic obstructive pulmonary disease with (acute) exacerbation: Secondary | ICD-10-CM | POA: Diagnosis not present

## 2023-02-16 DIAGNOSIS — Z7951 Long term (current) use of inhaled steroids: Secondary | ICD-10-CM

## 2023-02-16 DIAGNOSIS — J209 Acute bronchitis, unspecified: Secondary | ICD-10-CM | POA: Diagnosis present

## 2023-02-16 DIAGNOSIS — Z1152 Encounter for screening for COVID-19: Secondary | ICD-10-CM

## 2023-02-16 DIAGNOSIS — E43 Unspecified severe protein-calorie malnutrition: Secondary | ICD-10-CM | POA: Diagnosis not present

## 2023-02-16 DIAGNOSIS — Z7982 Long term (current) use of aspirin: Secondary | ICD-10-CM | POA: Diagnosis not present

## 2023-02-16 DIAGNOSIS — J440 Chronic obstructive pulmonary disease with acute lower respiratory infection: Secondary | ICD-10-CM | POA: Diagnosis not present

## 2023-02-16 DIAGNOSIS — Z8249 Family history of ischemic heart disease and other diseases of the circulatory system: Secondary | ICD-10-CM | POA: Diagnosis not present

## 2023-02-16 DIAGNOSIS — A310 Pulmonary mycobacterial infection: Secondary | ICD-10-CM | POA: Diagnosis present

## 2023-02-16 DIAGNOSIS — Z885 Allergy status to narcotic agent status: Secondary | ICD-10-CM

## 2023-02-16 DIAGNOSIS — I2510 Atherosclerotic heart disease of native coronary artery without angina pectoris: Secondary | ICD-10-CM | POA: Diagnosis present

## 2023-02-16 DIAGNOSIS — Z955 Presence of coronary angioplasty implant and graft: Secondary | ICD-10-CM | POA: Diagnosis not present

## 2023-02-16 DIAGNOSIS — Z681 Body mass index (BMI) 19 or less, adult: Secondary | ICD-10-CM | POA: Diagnosis not present

## 2023-02-16 DIAGNOSIS — D649 Anemia, unspecified: Secondary | ICD-10-CM | POA: Diagnosis not present

## 2023-02-16 DIAGNOSIS — R911 Solitary pulmonary nodule: Secondary | ICD-10-CM | POA: Diagnosis present

## 2023-02-16 DIAGNOSIS — Z79899 Other long term (current) drug therapy: Secondary | ICD-10-CM

## 2023-02-16 DIAGNOSIS — R54 Age-related physical debility: Secondary | ICD-10-CM | POA: Diagnosis present

## 2023-02-16 DIAGNOSIS — K219 Gastro-esophageal reflux disease without esophagitis: Secondary | ICD-10-CM | POA: Diagnosis present

## 2023-02-16 DIAGNOSIS — Z87891 Personal history of nicotine dependence: Secondary | ICD-10-CM | POA: Diagnosis not present

## 2023-02-16 DIAGNOSIS — F419 Anxiety disorder, unspecified: Secondary | ICD-10-CM | POA: Diagnosis not present

## 2023-02-16 DIAGNOSIS — J439 Emphysema, unspecified: Secondary | ICD-10-CM | POA: Diagnosis not present

## 2023-02-16 DIAGNOSIS — I10 Essential (primary) hypertension: Secondary | ICD-10-CM | POA: Diagnosis present

## 2023-02-16 DIAGNOSIS — Q391 Atresia of esophagus with tracheo-esophageal fistula: Secondary | ICD-10-CM | POA: Diagnosis not present

## 2023-02-16 DIAGNOSIS — J690 Pneumonitis due to inhalation of food and vomit: Principal | ICD-10-CM | POA: Diagnosis present

## 2023-02-16 DIAGNOSIS — K224 Dyskinesia of esophagus: Secondary | ICD-10-CM | POA: Diagnosis not present

## 2023-02-16 DIAGNOSIS — J189 Pneumonia, unspecified organism: Secondary | ICD-10-CM

## 2023-02-16 DIAGNOSIS — Z7902 Long term (current) use of antithrombotics/antiplatelets: Secondary | ICD-10-CM

## 2023-02-16 DIAGNOSIS — J9621 Acute and chronic respiratory failure with hypoxia: Secondary | ICD-10-CM | POA: Diagnosis present

## 2023-02-16 DIAGNOSIS — Z888 Allergy status to other drugs, medicaments and biological substances status: Secondary | ICD-10-CM

## 2023-02-16 DIAGNOSIS — K449 Diaphragmatic hernia without obstruction or gangrene: Secondary | ICD-10-CM | POA: Diagnosis present

## 2023-02-16 DIAGNOSIS — J9622 Acute and chronic respiratory failure with hypercapnia: Secondary | ICD-10-CM | POA: Diagnosis present

## 2023-02-16 LAB — URINALYSIS, ROUTINE W REFLEX MICROSCOPIC
Bilirubin Urine: NEGATIVE
Glucose, UA: NEGATIVE mg/dL
Hgb urine dipstick: NEGATIVE
Ketones, ur: 15 mg/dL — AB
Leukocytes,Ua: NEGATIVE
Nitrite: NEGATIVE
Protein, ur: NEGATIVE mg/dL
Specific Gravity, Urine: 1.02 (ref 1.005–1.030)
pH: 6 (ref 5.0–8.0)

## 2023-02-16 LAB — COMPREHENSIVE METABOLIC PANEL
ALT: 16 U/L (ref 0–44)
AST: 18 U/L (ref 15–41)
Albumin: 3.9 g/dL (ref 3.5–5.0)
Alkaline Phosphatase: 49 U/L (ref 38–126)
Anion gap: 7 (ref 5–15)
BUN: 20 mg/dL (ref 8–23)
CO2: 36 mmol/L — ABNORMAL HIGH (ref 22–32)
Calcium: 9.2 mg/dL (ref 8.9–10.3)
Chloride: 93 mmol/L — ABNORMAL LOW (ref 98–111)
Creatinine, Ser: 0.53 mg/dL (ref 0.44–1.00)
GFR, Estimated: >60 mL/min (ref >60)
Glucose, Bld: 117 mg/dL — ABNORMAL HIGH (ref 70–99)
Potassium: 4.5 mmol/L (ref 3.5–5.1)
Sodium: 136 mmol/L (ref 135–145)
Total Bilirubin: 0.6 mg/dL (ref 0.0–1.2)
Total Protein: 7 g/dL (ref 6.5–8.1)

## 2023-02-16 LAB — CBC
HCT: 38.8 % (ref 36.0–46.0)
Hemoglobin: 12 g/dL (ref 12.0–15.0)
MCH: 30.1 pg (ref 26.0–34.0)
MCHC: 30.9 g/dL (ref 30.0–36.0)
MCV: 97.2 fL (ref 80.0–100.0)
Platelets: 242 10*3/uL (ref 150–400)
RBC: 3.99 MIL/uL (ref 3.87–5.11)
RDW: 12 % (ref 11.5–15.5)
WBC: 7.8 10*3/uL (ref 4.0–10.5)
nRBC: 0 % (ref 0.0–0.2)

## 2023-02-16 LAB — LIPASE, BLOOD: Lipase: 34 U/L (ref 11–51)

## 2023-02-16 LAB — RESP PANEL BY RT-PCR (RSV, FLU A&B, COVID)  RVPGX2
Influenza A by PCR: NEGATIVE
Influenza B by PCR: NEGATIVE
Resp Syncytial Virus by PCR: NEGATIVE
SARS Coronavirus 2 by RT PCR: NEGATIVE

## 2023-02-16 MED ORDER — IPRATROPIUM-ALBUTEROL 0.5-2.5 (3) MG/3ML IN SOLN
3.0000 mL | Freq: Once | RESPIRATORY_TRACT | Status: AC
  Administered 2023-02-16: 3 mL via RESPIRATORY_TRACT
  Filled 2023-02-16: qty 3

## 2023-02-16 MED ORDER — VANCOMYCIN HCL IN DEXTROSE 1-5 GM/200ML-% IV SOLN
1000.0000 mg | Freq: Once | INTRAVENOUS | Status: AC
  Administered 2023-02-16: 1000 mg via INTRAVENOUS
  Filled 2023-02-16: qty 200

## 2023-02-16 MED ORDER — METHYLPREDNISOLONE SODIUM SUCC 125 MG IJ SOLR
125.0000 mg | Freq: Once | INTRAMUSCULAR | Status: AC
Start: 2023-02-16 — End: 2023-02-16
  Administered 2023-02-16: 125 mg via INTRAVENOUS
  Filled 2023-02-16: qty 2

## 2023-02-16 MED ORDER — SODIUM CHLORIDE 0.9 % IV SOLN
2.0000 g | Freq: Once | INTRAVENOUS | Status: AC
  Administered 2023-02-16: 2 g via INTRAVENOUS

## 2023-02-16 NOTE — ED Triage Notes (Signed)
Pt states over the past week she has had a cough that is not normal for her. No fever but she is having sweats. Pt states she also has body aches and upset stomach for the past three days. Was admitted around christmas with pneumonia.  After being released she was placed on a course of steroid and additional antibiotics. These are complete.

## 2023-02-16 NOTE — ED Provider Notes (Signed)
[place] [facility] Provider Note   CSN: [id number] Arrival date & time: 02/16/23  1710     History  Chief Complaint  Patient presents with   Shortness of Breath   Cough    Kristy Weeks is a 73 y.o. female.  Is a 73 year old female presenting emergency department for shortness of breath and cough in the setting of COPD on 4 L of oxygen.  Was admitted last month for pneumonia per her report.  Taking prednisone and prophylactic azithromycin outpatient.  Has reported increasing shortness of breath and dyspnea on exertion for the past week with cough and increased sputum production with green color change.  She reports some mild shortness of breath at rest, but largely when she exerts herself it is worse.   Shortness of Breath Associated symptoms: cough   Cough Associated symptoms: shortness of breath        Home Medications Prior to Admission medications   Medication Sig Start Date End Date Taking? Authorizing Provider  albuterol (PROVENTIL) (2.5 MG/3ML) 0.083% nebulizer solution Take 2.5 mg by nebulization every 6 (six) hours as needed for wheezing or shortness of breath.    [provider]  Albuterol Sulfate 108 (90 Base) MCG/ACT AEPB Inhale 2-6 puffs into the lungs daily.     [provider]  aspirin EC 81 MG tablet Take 81 mg by mouth daily.    [provider]  atorvastatin (LIPITOR) 40 MG tablet Take 40 mg by mouth at bedtime.    [provider]  carvedilol (COREG) 6.25 MG tablet Take 6.25 mg by mouth 2 (two) times daily with a meal.    [provider]  clopidogrel (PLAVIX) 75 MG tablet Take 75 mg by mouth daily.    [provider]  erythromycin ophthalmic ointment Place a 1/2 inch ribbon of ointment into the lower eyelid. 10/14/22   Glyn Ade, MD  fluticasone furoate-vilanterol (BREO ELLIPTA) 100-25 MCG/INH AEPB Inhale 1 puff into the lungs daily.    [provider]  ipratropium-albuterol (DUONEB) 0.5-2.5 (3) MG/3ML SOLN Take 3 mLs by nebulization every 6 (six) hours as needed (for shortness of breath).     [provider]  lisinopril (PRINIVIL,ZESTRIL) 10 MG tablet Take 10 mg by mouth at bedtime.    [provider]  tiotropium (SPIRIVA) 18 MCG inhalation capsule Place 18 mcg into inhaler and inhale daily.    [provider]  traMADol (ULTRAM) 50 MG tablet Take 1 tablet (50 mg total) by mouth every 12 (twelve) hours as needed for up to 10 doses for moderate pain or severe pain. 01/28/18   Miguel Rota, MD      Allergies    Cefdinir    Review of Systems   Review of Systems  Respiratory:  Positive for cough and shortness of breath.     Physical Exam Updated Vital Signs BP 137/83 (BP Location: Left Arm)   Pulse 96   Temp 97.7 F (36.5 C)   Resp 20   Ht 5\' 4"  (1.626 m)   Wt 44.5 kg   SpO2 92%   BMI 16.82 kg/m  Physical Exam Vitals and nursing note reviewed.  Constitutional:      General: She is not in acute distress.    Appearance: She is not toxic-appearing.  Cardiovascular:     Rate and Rhythm: Normal rate and regular rhythm.  Pulmonary:     Effort: Pulmonary effort is normal.     Breath  sounds: Normal breath sounds.  Musculoskeletal:     Cervical back: Normal range of motion.     Right lower leg: No edema.     Left lower leg: No edema.  Skin:    General: Skin is warm and dry.     Capillary Refill: Capillary refill takes less than 2 seconds.  Neurological:     General: No focal deficit present.     Mental Status: She is alert.  Psychiatric:        Mood and Affect: Mood normal.        Behavior: Behavior normal.     ED Results / Procedures / Treatments   Labs (all labs ordered are listed, but only abnormal results are displayed) Labs Reviewed  COMPREHENSIVE METABOLIC PANEL - Abnormal; Notable for the following components:      Result Value   Chloride 93 (*)    CO2 36 (*)    Glucose, Bld 117  (*)    All other components within normal limits  RESP PANEL BY RT-PCR (RSV, FLU A&B, COVID)  RVPGX2  LIPASE, BLOOD  CBC  URINALYSIS, ROUTINE W REFLEX MICROSCOPIC    EKG None  Radiology DG Chest 2 View Result Date: 02/16/2023 CLINICAL DATA:  Cough, short of breath EXAM: CHEST - 2 VIEW COMPARISON:  01/19/2023, 01/18/2023 FINDINGS: Single frontal view of the chest demonstrates a stable cardiac silhouette. Stable ectasia and atherosclerosis of the thoracic aorta. Increased fluid within the right apical cavity seen previously. Severe bullous emphysema. Continued consolidation at the medial right lung base less pronounced since prior x-ray and CT. No evidence of pneumothorax. No acute bony abnormalities. IMPRESSION: 1. Increased fluid within a large right apical cavity, concerning for superinfection. 2. Severe emphysema, with continued consolidation at the right lung base consistent with improving pneumonia or resolving atelectasis. Electronically Signed   By: Sharlet Salina M.D.   On: 02/16/2023 18:06    Procedures Procedures    Medications Ordered in ED Medications  methylPREDNISolone sodium succinate (SOLU-MEDROL) 125 mg/2 mL injection 125 mg (has no administration in time range)  aztreonam (AZACTAM) 2 g in sodium chloride 0.9 % 100 mL IVPB (has no administration in time range)  vancomycin (VANCOCIN) IVPB 1000 mg/200 mL premix (has no administration in time range)  ipratropium-albuterol (DUONEB) 0.5-2.5 (3) MG/3ML nebulizer solution 3 mL (3 mLs Nebulization Given 02/16/23 2020)    ED Course/ Medical Decision Making/ A&P Clinical Course as of 02/16/23 2131  Mon Feb 16, 2023  2122 Patient desatted with ambulation after breathing treatment.  Does not appear to be in respiratory distress at rest.  Appears to have pneumonia on chest x-ray.  No leukocytosis tachycardia to suggest systemic infection.  No significant metabolic derangements.  Lipase normal.  Pancreatitis unlikely.  Given worsening  shortness of breath and patient's comorbid emphysema concern for possible viral etiology that would require treatment.  Swab for flu/COVID/RSV.  However negative.  Solu-Medrol ordered for COPD component.  Given patient recently hospitalized and is on chronic azithromycin will cover for healthcare pneumonia.  It appears that she has allergy to cefdinir.  Will give bank and is each her name. [TY]    Clinical Course User Index [TY] Coral Spikes, DO                                 Medical Decision Making 73 year old female presenting emergency department for shortness of breath in the setting of COPD.  Low 90s on her home oxygen.  Does not appear to be in respiratory distress.  Coarse breath sounds and diffuse wheezing on exam.  Triage labs largely reassuring.  Chest x-ray with pneumonia that appears to be new/worsening since prior imaging.  Will give DuoNeb and ambulate.  Amount and/or Complexity of Data Reviewed Independent Historian:     Details: Son notes that patient's been desaturating the past week and a half or so with ambulation External Data Reviewed:     Details: Was admitted 12/23 for COPD exacerbation and treated for pneumonia at that time. Labs: ordered. Decision-making details documented in ED Course. Radiology: ordered. Decision-making details documented in ED Course. ECG/medicine tests:  Decision-making details documented in ED Course.  Risk Prescription drug management. Decision regarding hospitalization. Risk Details: Given patient's age, underlying COPD with desaturation with ambulation and chest x-ray showing pneumonia; feel that she would benefit from inpatient admission for IV antibiotics and scheduled breathing treatments.          Final Clinical Impression(s) / ED Diagnoses Final diagnoses:  None    Rx / DC Orders ED Discharge Orders     None         Coral Spikes, DO 02/16/23 2131

## 2023-02-16 NOTE — ED Triage Notes (Signed)
Pt has COPD and wears 4L 02 at baseline.

## 2023-02-17 ENCOUNTER — Inpatient Hospital Stay (HOSPITAL_COMMUNITY): Payer: Medicare Other

## 2023-02-17 DIAGNOSIS — J440 Chronic obstructive pulmonary disease with acute lower respiratory infection: Secondary | ICD-10-CM | POA: Diagnosis present

## 2023-02-17 DIAGNOSIS — J441 Chronic obstructive pulmonary disease with (acute) exacerbation: Secondary | ICD-10-CM

## 2023-02-17 DIAGNOSIS — D649 Anemia, unspecified: Secondary | ICD-10-CM | POA: Diagnosis present

## 2023-02-17 DIAGNOSIS — Z888 Allergy status to other drugs, medicaments and biological substances status: Secondary | ICD-10-CM | POA: Diagnosis not present

## 2023-02-17 DIAGNOSIS — Z1152 Encounter for screening for COVID-19: Secondary | ICD-10-CM | POA: Diagnosis not present

## 2023-02-17 DIAGNOSIS — E43 Unspecified severe protein-calorie malnutrition: Secondary | ICD-10-CM | POA: Diagnosis present

## 2023-02-17 DIAGNOSIS — J690 Pneumonitis due to inhalation of food and vomit: Secondary | ICD-10-CM | POA: Diagnosis present

## 2023-02-17 DIAGNOSIS — J439 Emphysema, unspecified: Secondary | ICD-10-CM | POA: Diagnosis present

## 2023-02-17 DIAGNOSIS — J9621 Acute and chronic respiratory failure with hypoxia: Secondary | ICD-10-CM | POA: Diagnosis present

## 2023-02-17 DIAGNOSIS — K219 Gastro-esophageal reflux disease without esophagitis: Secondary | ICD-10-CM | POA: Diagnosis present

## 2023-02-17 DIAGNOSIS — I2510 Atherosclerotic heart disease of native coronary artery without angina pectoris: Secondary | ICD-10-CM | POA: Diagnosis present

## 2023-02-17 DIAGNOSIS — Z8249 Family history of ischemic heart disease and other diseases of the circulatory system: Secondary | ICD-10-CM | POA: Diagnosis not present

## 2023-02-17 DIAGNOSIS — F419 Anxiety disorder, unspecified: Secondary | ICD-10-CM | POA: Diagnosis present

## 2023-02-17 DIAGNOSIS — Z9981 Dependence on supplemental oxygen: Secondary | ICD-10-CM | POA: Diagnosis not present

## 2023-02-17 DIAGNOSIS — Z885 Allergy status to narcotic agent status: Secondary | ICD-10-CM | POA: Diagnosis not present

## 2023-02-17 DIAGNOSIS — Z87891 Personal history of nicotine dependence: Secondary | ICD-10-CM | POA: Diagnosis not present

## 2023-02-17 DIAGNOSIS — Z7982 Long term (current) use of aspirin: Secondary | ICD-10-CM | POA: Diagnosis not present

## 2023-02-17 DIAGNOSIS — A310 Pulmonary mycobacterial infection: Secondary | ICD-10-CM | POA: Diagnosis present

## 2023-02-17 DIAGNOSIS — Z7951 Long term (current) use of inhaled steroids: Secondary | ICD-10-CM | POA: Diagnosis not present

## 2023-02-17 DIAGNOSIS — J9620 Acute and chronic respiratory failure, unspecified whether with hypoxia or hypercapnia: Secondary | ICD-10-CM | POA: Diagnosis not present

## 2023-02-17 DIAGNOSIS — J9622 Acute and chronic respiratory failure with hypercapnia: Secondary | ICD-10-CM | POA: Diagnosis present

## 2023-02-17 DIAGNOSIS — K224 Dyskinesia of esophagus: Secondary | ICD-10-CM | POA: Diagnosis present

## 2023-02-17 DIAGNOSIS — I10 Essential (primary) hypertension: Secondary | ICD-10-CM | POA: Diagnosis present

## 2023-02-17 DIAGNOSIS — Q391 Atresia of esophagus with tracheo-esophageal fistula: Secondary | ICD-10-CM | POA: Diagnosis not present

## 2023-02-17 DIAGNOSIS — Z681 Body mass index (BMI) 19 or less, adult: Secondary | ICD-10-CM | POA: Diagnosis not present

## 2023-02-17 DIAGNOSIS — Z955 Presence of coronary angioplasty implant and graft: Secondary | ICD-10-CM | POA: Diagnosis not present

## 2023-02-17 LAB — EXPECTORATED SPUTUM ASSESSMENT W GRAM STAIN, RFLX TO RESP C

## 2023-02-17 LAB — MRSA NEXT GEN BY PCR, NASAL: MRSA by PCR Next Gen: NOT DETECTED

## 2023-02-17 MED ORDER — VANCOMYCIN HCL 1250 MG/250ML IV SOLN
1250.0000 mg | INTRAVENOUS | Status: DC
  Filled 2023-02-17: qty 250

## 2023-02-17 MED ORDER — TIOTROPIUM BROMIDE MONOHYDRATE 18 MCG IN CAPS: 18.0000 ug | ORAL_CAPSULE | Freq: Every day | RESPIRATORY_TRACT | Status: DC

## 2023-02-17 MED ORDER — ALBUTEROL SULFATE (2.5 MG/3ML) 0.083% IN NEBU
2.5000 mg | INHALATION_SOLUTION | RESPIRATORY_TRACT | Status: DC | PRN
  Administered 2023-02-18 – 2023-02-19 (×2): 2.5 mg via RESPIRATORY_TRACT
  Filled 2023-02-17 (×2): qty 3

## 2023-02-17 MED ORDER — IPRATROPIUM-ALBUTEROL 0.5-2.5 (3) MG/3ML IN SOLN
3.0000 mL | Freq: Once | RESPIRATORY_TRACT | Status: AC
  Administered 2023-02-17: 3 mL via RESPIRATORY_TRACT
  Filled 2023-02-17: qty 3

## 2023-02-17 MED ORDER — BUDESONIDE 0.5 MG/2ML IN SUSP
0.5000 mg | Freq: Two times a day (BID) | RESPIRATORY_TRACT | Status: DC
  Administered 2023-02-17 – 2023-02-21 (×8): 0.5 mg via RESPIRATORY_TRACT
  Filled 2023-02-17 (×9): qty 2

## 2023-02-17 MED ORDER — ENOXAPARIN SODIUM 40 MG/0.4ML IJ SOSY
40.0000 mg | PREFILLED_SYRINGE | INTRAMUSCULAR | Status: DC
  Administered 2023-02-17: 40 mg via SUBCUTANEOUS
  Filled 2023-02-17: qty 0.4

## 2023-02-17 MED ORDER — FLUTICASONE FUROATE-VILANTEROL 100-25 MCG/INH IN AEPB: 1.0000 | INHALATION_SPRAY | Freq: Every day | RESPIRATORY_TRACT | Status: DC

## 2023-02-17 MED ORDER — ATORVASTATIN CALCIUM 20 MG PO TABS
40.0000 mg | ORAL_TABLET | Freq: Every day | ORAL | Status: DC
  Administered 2023-02-17 – 2023-02-20 (×4): 40 mg via ORAL
  Filled 2023-02-17 (×4): qty 2

## 2023-02-17 MED ORDER — ENOXAPARIN SODIUM 30 MG/0.3ML IJ SOSY
30.0000 mg | PREFILLED_SYRINGE | INTRAMUSCULAR | Status: DC
  Administered 2023-02-18 – 2023-02-21 (×4): 30 mg via SUBCUTANEOUS
  Filled 2023-02-17 (×4): qty 0.3

## 2023-02-17 MED ORDER — ONDANSETRON HCL 4 MG/2ML IJ SOLN: 4.0000 mg | Freq: Four times a day (QID) | INTRAMUSCULAR | Status: DC | PRN

## 2023-02-17 MED ORDER — IBUPROFEN 400 MG PO TABS
400.0000 mg | ORAL_TABLET | Freq: Four times a day (QID) | ORAL | Status: DC | PRN
Start: 2023-02-17 — End: 2023-02-21

## 2023-02-17 MED ORDER — CLOPIDOGREL BISULFATE 75 MG PO TABS
75.0000 mg | ORAL_TABLET | Freq: Every day | ORAL | Status: DC
  Administered 2023-02-18 – 2023-02-21 (×4): 75 mg via ORAL
  Filled 2023-02-17 (×4): qty 1

## 2023-02-17 MED ORDER — ALPRAZOLAM 0.25 MG PO TABS
0.2500 mg | ORAL_TABLET | Freq: Three times a day (TID) | ORAL | Status: DC | PRN
  Administered 2023-02-17 – 2023-02-20 (×4): 0.25 mg via ORAL
  Filled 2023-02-17 (×4): qty 1

## 2023-02-17 MED ORDER — VANCOMYCIN HCL IN DEXTROSE 1-5 GM/200ML-% IV SOLN
1000.0000 mg | INTRAVENOUS | Status: DC
  Administered 2023-02-18: 1000 mg via INTRAVENOUS
  Filled 2023-02-17: qty 200

## 2023-02-17 MED ORDER — CARVEDILOL 6.25 MG PO TABS
6.2500 mg | ORAL_TABLET | Freq: Two times a day (BID) | ORAL | Status: DC
  Administered 2023-02-17 – 2023-02-21 (×7): 6.25 mg via ORAL
  Filled 2023-02-17 (×7): qty 1

## 2023-02-17 MED ORDER — ASPIRIN 81 MG PO TBEC
81.0000 mg | DELAYED_RELEASE_TABLET | Freq: Every day | ORAL | Status: DC
  Administered 2023-02-18 – 2023-02-21 (×4): 81 mg via ORAL
  Filled 2023-02-17 (×4): qty 1

## 2023-02-17 MED ORDER — METHYLPREDNISOLONE SODIUM SUCC 40 MG IJ SOLR
40.0000 mg | Freq: Every day | INTRAMUSCULAR | Status: DC
  Administered 2023-02-18 – 2023-02-21 (×4): 40 mg via INTRAVENOUS
  Filled 2023-02-17 (×4): qty 1

## 2023-02-17 MED ORDER — ACETAMINOPHEN 325 MG PO TABS
650.0000 mg | ORAL_TABLET | Freq: Once | ORAL | Status: AC
Start: 2023-02-17 — End: 2023-02-17
  Administered 2023-02-17: 650 mg via ORAL
  Filled 2023-02-17: qty 2

## 2023-02-17 MED ORDER — LISINOPRIL 10 MG PO TABS
10.0000 mg | ORAL_TABLET | Freq: Every day | ORAL | Status: DC
  Administered 2023-02-17 – 2023-02-20 (×4): 10 mg via ORAL
  Filled 2023-02-17 (×4): qty 1

## 2023-02-17 MED ORDER — ONDANSETRON HCL 4 MG PO TABS
4.0000 mg | ORAL_TABLET | Freq: Four times a day (QID) | ORAL | Status: DC | PRN
  Filled 2023-02-17: qty 1

## 2023-02-17 MED ORDER — METHYLPREDNISOLONE SODIUM SUCC 40 MG IJ SOLR
40.0000 mg | Freq: Two times a day (BID) | INTRAMUSCULAR | Status: DC
  Administered 2023-02-17: 40 mg via INTRAVENOUS
  Filled 2023-02-17: qty 1

## 2023-02-17 MED ORDER — TRAZODONE HCL 50 MG PO TABS: 25.0000 mg | ORAL_TABLET | Freq: Every evening | ORAL | Status: DC | PRN

## 2023-02-17 MED ORDER — IPRATROPIUM-ALBUTEROL 0.5-2.5 (3) MG/3ML IN SOLN
3.0000 mL | Freq: Four times a day (QID) | RESPIRATORY_TRACT | Status: DC
  Administered 2023-02-17 – 2023-02-21 (×16): 3 mL via RESPIRATORY_TRACT
  Filled 2023-02-17 (×17): qty 3

## 2023-02-17 MED ORDER — SODIUM CHLORIDE 0.9 % IV SOLN
2.0000 g | Freq: Two times a day (BID) | INTRAVENOUS | Status: DC
  Administered 2023-02-17 – 2023-02-21 (×9): 2 g via INTRAVENOUS
  Filled 2023-02-17 (×11): qty 12.5

## 2023-02-17 NOTE — Consult Note (Signed)
NAME:  Kristy Weeks, MRN:  [id number], DOB:  [birth year], LOS: 0 ADMISSION DATE:  02/16/2023, CONSULTATION DATE:  1/21 REFERRING MD:  Kirby Crigler, CHIEF COMPLAINT:  acute on chronic resp failure    History of Present Illness:  Just Discharged home in Dec on steroid taper and  azith. Seen in f/u w/ [facility] on 12/30. At that point back on her low dose pred alternating 10 mg and 20 mg EOD and  M/W/F azith. Was supposed to have f/u CT in Feb. Presents to ER 1/20 w/ 7-10d h/o increased cough which was productive of green sputum, increased SOB, both at rest and w/ exertion. She actually started taking her azithro daily about 48 hrs prior to coming to the ER and was starting to feel a little better but still had significant symptom burden hence her arrival to ER . Started on antibiotics and steroids in addition to supplemental oxygen and bronchodilators. Pcxr w/ known right apical cavitary process, right basilar airspace disease w/ possible effusion, left lung clear. IM consulted pulm re: thoughts on etiology of infection and to make recs on imaging if needed.   Pertinent  Medical History  Chronic hypoxic resp failure 3 lpm baseline, prior MAC w. Chronic right apical cavitation. COPD, CAD, HTN. Recent admit December at [facility] for AECOPD and PNA.  Significant Hospital Events: Including procedures, antibiotic start and stop dates in addition to other pertinent events   1/20 admitted w/ recurrent acute on chronic resp failure 2/2 AECOPD vs possible asp  Interim History / Subjective:  Feels a little better   Objective   Blood pressure (!) 112/98, pulse 93, temperature 97.9 F (36.6 C), temperature source Oral, resp. rate 18, height 5\' 4"  (1.626 m), weight 44.5 kg, SpO2 94%.       No intake or output data in the 24 hours ending 02/17/23 1306 Filed Weights   02/16/23 1718  Weight: 44.5 kg    Examination: General: frail 73 year old female resting in bed she is in no acute distress HENT: NCAT does  have some temporal wasting MMM no discoloration in mouth Lungs: diffuse bilateral prolonged exp wheeze w/ occ rhonchi after cough. 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 Can go to 40mg  and start taper after 5-7d (back to baseline) Cont BDs and scheduled ICS Ck daily PCT Send sputum  SLP eval  Also esophagram.  Will need f/u w/ [facility]   Best Practice (right click and "Reselect all SmartList Selections" daily)   Per primary   Labs   CBC: Recent Labs  Lab 02/16/23 1727  WBC 7.8  HGB 12.0  HCT 38.8  MCV 97.2  PLT 242    Basic Metabolic Panel: Recent Labs  Lab 02/16/23 1727  NA 136  K 4.5  CL 93*  CO2 36*  GLUCOSE 117*  BUN 20  CREATININE 0.53  CALCIUM 9.2   GFR: Estimated Creatinine Clearance: 43.3 mL/min (by  C-G formula based on SCr of 0.53 mg/dL). Recent Labs  Lab 02/16/23 1727  WBC 7.8    Liver Function Tests: Recent Labs  Lab 02/16/23 1727  AST 18  ALT 16  ALKPHOS 49  BILITOT 0.6  PROT 7.0  ALBUMIN 3.9   Recent Labs  Lab 02/16/23 1727  LIPASE 34   No results for input(s): "AMMONIA" in the last 168 hours.  ABG    Component Value Date/Time   HCO3 31.8 (H) 01/25/2018 2202   TCO2 34 (H) 01/25/2018 2202   O2SAT 89.0 01/25/2018 2202     Coagulation Profile: No results for input(s): "INR", "PROTIME" in the last 168 hours.  Cardiac Enzymes: No results  for input(s): "CKTOTAL", "CKMB", "CKMBINDEX", "TROPONINI" in the last 168 hours.  HbA1C: Hgb A1c MFr Bld  Date/Time Value Ref Range Status  01/26/2018 11:21 AM 5.4 4.8 - 5.6 % Final    Comment:    (NOTE) Pre diabetes:          5.7%-6.4% Diabetes:              >6.4% Glycemic control for   <7.0% adults with diabetes     CBG: No results for input(s): "GLUCAP" in the last 168 hours.  Review of Systems:   Review of Systems  Constitutional:  Positive for fever and malaise/fatigue.  HENT:  Positive for congestion and sinus pain.   Eyes: Negative.   Respiratory:  Positive for cough, sputum production, shortness of breath and wheezing.   Cardiovascular: Negative.   Gastrointestinal: Negative.   Genitourinary: Negative.   Musculoskeletal: Negative.   Skin: Negative.   Neurological: Negative.   Endo/Heme/Allergies: Negative.   Psychiatric/Behavioral: Negative.       Past Medical History:  She,  has a past medical history of COPD (chronic obstructive pulmonary disease) (HCC), Coronary artery disease, and Hypertension.   Surgical History:   Past Surgical History:  Procedure Laterality Date   CORONARY ANGIOPLASTY WITH STENT PLACEMENT       Social History:   reports that she quit smoking about 7 years ago. Her smoking use included cigarettes. She started smoking about 47 years ago. She has a 40 pack-year smoking history. She has never used smokeless tobacco. She reports that she does not drink alcohol and does not use drugs.   Family History:  Her family history includes Heart failure (age of onset: 29) in her father.   Allergies Allergies  Allergen Reactions   Cefdinir Other (See Comments)    Abdominal pain   Hydrocodone-Acetaminophen Other (See Comments)    nausea     Home Medications  Prior to Admission medications   Medication Sig Start Date End Date Taking? Authorizing Provider  acetaminophen (TYLENOL) 500 MG tablet Take 1,000 mg by mouth every 6 (six) hours as  needed for moderate pain (pain score 4-6) or mild pain (pain score 1-3).   Yes [provider]  albuterol (PROVENTIL) (2.5 MG/3ML) 0.083% nebulizer solution Take 2.5 mg by nebulization every 6 (six) hours as needed for wheezing or shortness of breath.   Yes [provider]  ALPRAZolam (XANAX) 0.5 MG tablet Take 0.25-0.5 mg by mouth at bedtime as needed for anxiety or sleep. 01/22/23 02/21/23 Yes [provider]  aspirin EC 81 MG tablet Take 81 mg by mouth daily.   Yes [provider]  atorvastatin (LIPITOR) 40 MG tablet Take 40 mg by mouth at bedtime.   Yes [provider]  azithromycin (ZITHROMAX) 500 MG tablet Take 1  tablet by mouth on 09/11/2022, then take one tablet by mouth once daily on Monday, Wednesday, Friday. 02/02/23 08/05/23 Yes [provider]  budesonide (PULMICORT) 0.5 MG/2ML nebulizer solution USE 1 VIAL VIA NEBULIZER EVERY 12 HOURS   Yes [provider]  carvedilol (COREG) 6.25 MG tablet Take 6.25 mg by mouth 2 (two) times daily with a meal.   Yes [provider]  clopidogrel (PLAVIX) 75 MG tablet Take 75 mg by mouth daily.   Yes [provider]  folic acid (FOLVITE) 1 MG tablet Take 1 mg by mouth daily. 03/13/22  Yes [provider]  formoterol (PERFOROMIST) 20 MCG/2ML nebulizer solution USE 1 VIAL VIA NEBULIZER EVERY 12 HOURS   Yes [provider]  ipratropium-albuterol (DUONEB) 0.5-2.5 (3) MG/3ML SOLN Take 3 mLs by nebulization every 6 (six) hours as needed (for shortness of breath).    Yes [provider]  lisinopril (PRINIVIL,ZESTRIL) 10 MG tablet Take 10 mg by mouth at bedtime.   Yes [provider]  predniSONE (DELTASONE) 5 MG tablet Take 5-10 mg by mouth See admin instructions. Take 5 mg by mouth on daily and then take 10 mg by mouth once daily the next day. Alternating doses. 02/10/23  Yes [provider]  Albuterol Sulfate 108 (90 Base) MCG/ACT AEPB Inhale 2-6  puffs into the lungs daily.  Patient not taking: Reported on 02/17/2023    [provider]  erythromycin ophthalmic ointment Place a 1/2 inch ribbon of ointment into the lower eyelid. Patient not taking: Reported on 02/17/2023 10/14/22   Glyn Ade, MD  fluticasone furoate-vilanterol (BREO ELLIPTA) 100-25 MCG/INH AEPB Inhale 1 puff into the lungs daily. Patient not taking: Reported on 02/17/2023    [provider]  INCRUSE ELLIPTA 62.5 MCG/ACT AEPB Inhale 1 puff into the lungs daily. Patient not taking: Reported on 02/17/2023    [provider]     Critical care time: NA

## 2023-02-17 NOTE — Progress Notes (Signed)
Pharmacy Antibiotic Note  Kristy Weeks is a 73 y.o. female admitted on 02/16/2023 with pneumonia. Pharmacy has been consulted for vancomycin and cefepime dosing. Pt is afebrile and WBC is WNL. Of note, pt has a cefdinir allergy but has tolerated other cephalosporins in the past.  Plan: Cefepime 2gm IV Q12H  Vancomycin  1000 mg IV Q36H; eAUC 515.4, Css min 9.9, SCr rounded up to 0.8, TBW < IBW, Vd 0.72 F/u renal fxn, C&S, clinical status and peak/trough at SS Order MRSA PCR  Height: 5\' 4"  (162.6 cm) Weight: 44.5 kg (98 lb) IBW/kg (Calculated) : 54.7  Temp (24hrs), Avg:97.6 F (36.4 C), Min:97.4 F (36.3 C), Max:97.9 F (36.6 C)  Recent Labs  Lab 02/16/23 1727  WBC 7.8  CREATININE 0.53    Estimated Creatinine Clearance: 43.3 mL/min (by C-G formula based on SCr of 0.53 mg/dL).    Allergies  Allergen Reactions   Cefdinir Other (See Comments)    Abdominal pain   Hydrocodone-Acetaminophen Other (See Comments)    nausea    Antimicrobials this admission: Vanc 1/20>> Cefepime 1/21>> Aztreo x 1 1/20  Dose adjustments this admission: N/A  Microbiology results: 1/21 MRSA:   Thank you for allowing pharmacy to be a part of this patient's care.  Herby Abraham, Pharm.D Use secure chat for questions 02/17/2023 12:05 PM

## 2023-02-17 NOTE — ED Notes (Signed)
Called [facility] for Transfer to [employer].  Spoke with [employer]

## 2023-02-17 NOTE — Progress Notes (Signed)
Pharmacy Antibiotic Note  Kristy Weeks is a 73 y.o. female admitted on 02/16/2023 with pneumonia. Pharmacy has been consulted for vancomycin and cefepime dosing. Pt is afebrile and WBC is WNL. Of note, pt has a cefdinir allergy but has tolerated other cephalosporins in the past.  Plan: Cefepime 2gm IV Q12H  Vancomycin 1250mg  IV Q48H F/u renal fxn, C&S, clinical status and peak/trough at SS  Height: 5\' 4"  (162.6 cm) Weight: 44.5 kg (98 lb) IBW/kg (Calculated) : 54.7  Temp (24hrs), Avg:97.5 F (36.4 C), Min:97.4 F (36.3 C), Max:97.7 F (36.5 C)  Recent Labs  Lab 02/16/23 1727  WBC 7.8  CREATININE 0.53    Estimated Creatinine Clearance: 43.3 mL/min (by C-G formula based on SCr of 0.53 mg/dL).    Allergies  Allergen Reactions   Cefdinir Other (See Comments)    Abdominal pain    Antimicrobials this admission: Vanc 1/20>> Cefepime 1/21>> Aztreo x 1 1/20  Dose adjustments this admission: N/A  Microbiology results: Pending  Thank you for allowing pharmacy to be a part of this patient's care.  Joselin Crandell, Drake Leach 02/17/2023 8:19 AM

## 2023-02-17 NOTE — H&P (Signed)
History and Physical  Saba Soberon UEA:[id number] DOB: [birth year] DOA: 02/16/2023  PCP: Bailey Mech, PA-C   Chief Complaint: Cough, shortness of breath  HPI: Kristy Weeks is a 73 y.o. 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  She was recently discharged at the end of December from [facility] after stay for acute COPD exacerbation and concern for community-acquired pneumonia.  During that hospital stay, she was treated with Solu-Medrol and daily azithromycin.  Noted to benefit from low-dose p.o. Xanax as well.  She was discharged home on steroid taper, and prophylactic azithromycin 3 times a week.  In the interim, she was also seen by her outpatient pulmonologist.  States that for the last few days, she has noticed increased shortness of breath with exertion, and cough with increased sputum production which is green in color.  Also has been feeling clammy and sweaty intermittently.  Denies night sweats, fevers, chest pain.  States that her oxygen saturations have been dropping to the low 80s, even with minimal ambulation such as taking a few steps to the commode.  She has increased her supplemental oxygen to 5 L at home.  Workup in the emergency department with evidence of acute COPD exacerbation, acute on chronic hypoxia, and concern for healthcare acquired pneumonia.  Review of Systems: Please see HPI for pertinent positives and negatives. A complete 10 system review of systems are otherwise negative.  Past Medical History:  Diagnosis Date   COPD (chronic obstructive pulmonary disease) (HCC)    wears 2L most of the time   Coronary artery disease    Hypertension    Past Surgical History:  Procedure Laterality  Date   CORONARY ANGIOPLASTY WITH STENT PLACEMENT     Social History:  reports that she quit smoking about 7 years ago. Her smoking use included cigarettes. She started smoking about 47 years ago. She has a 40 pack-year smoking history. She has never used smokeless tobacco. She reports that she does not drink alcohol and does not use drugs.  Allergies  Allergen Reactions   Cefdinir Other (See Comments)    Abdominal pain   Hydrocodone-Acetaminophen Other (See Comments)    nausea    Family History  Problem Relation Age of Onset   Heart failure Father 57     Prior to Admission medications   Medication Sig Start Date End Date Taking? Authorizing Provider  ALPRAZolam Prudy Feeler) 0.5 MG tablet Take by mouth. 01/22/23 02/21/23 Yes [provider]  azithromycin (ZITHROMAX) 500 MG tablet Take 1 tablet by mouth on 09/11/2022, then take one tablet by mouth once daily on Monday, Wednesday, Friday. 02/02/23 08/05/23 Yes [provider]  predniSONE (DELTASONE) 5 MG tablet TAKE 1 TABLET BY MOUTH EVERY OTHER DAY(ALTERNATE 5 MG AND 10 MG) 02/10/23  Yes [provider]  albuterol (PROVENTIL) (2.5 MG/3ML) 0.083% nebulizer solution Take 2.5 mg by nebulization every 6 (six) hours as needed for wheezing or shortness of breath.    [provider]  Albuterol Sulfate 108 (90 Base) MCG/ACT AEPB Inhale 2-6 puffs into the lungs daily.     [provider]  aspirin EC 81 MG tablet Take 81 mg by mouth daily.    [provider]  atorvastatin (LIPITOR) 40 MG tablet Take 40 mg by mouth at bedtime.  [provider]  budesonide (PULMICORT) 0.5 MG/2ML nebulizer solution USE 1 VIAL VIA NEBULIZER EVERY 12 HOURS    [provider]  carvedilol (COREG) 6.25 MG tablet Take 6.25 mg by mouth 2 (two) times daily with a meal.    [provider]  clopidogrel (PLAVIX) 75 MG tablet Take 75 mg by mouth daily.    [provider]  erythromycin ophthalmic  ointment Place a 1/2 inch ribbon of ointment into the lower eyelid. 10/14/22   Glyn Ade, MD  fluticasone furoate-vilanterol (BREO ELLIPTA) 100-25 MCG/INH AEPB Inhale 1 puff into the lungs daily.    [provider]  formoterol (PERFOROMIST) 20 MCG/2ML nebulizer solution USE 1 VIAL VIA NEBULIZER EVERY 12 HOURS    [provider]  INCRUSE ELLIPTA 62.5 MCG/ACT AEPB Inhale 1 puff into the lungs daily.    [provider]  ipratropium-albuterol (DUONEB) 0.5-2.5 (3) MG/3ML SOLN Take 3 mLs by nebulization every 6 (six) hours as needed (for shortness of breath).     [provider]  lisinopril (PRINIVIL,ZESTRIL) 10 MG tablet Take 10 mg by mouth at bedtime.    [provider]  tiotropium (SPIRIVA) 18 MCG inhalation capsule Place 18 mcg into inhaler and inhale daily.    [provider]  traMADol (ULTRAM) 50 MG tablet Take 1 tablet (50 mg total) by mouth every 12 (twelve) hours as needed for up to 10 doses for moderate pain or severe pain. 01/28/18   Miguel Rota, MD    Physical Exam: BP (!) 112/98 (BP Location: Right Arm)   Pulse 93   Temp 97.9 F (36.6 C) (Oral)   Resp 18   Ht 5\' 4"  (1.626 m)   Wt 44.5 kg   SpO2 94%   BMI 16.82 kg/m  General:  Alert, oriented, calm, in no acute distress, thin and chronically ill-appearing.  However she looks nontoxic, pleasant and cooperative.  Wearing 5 L nasal cannula oxygen.  Speaking in full sentences, with only minimal dyspnea. Cardiovascular: RRR, no murmurs or rubs, no peripheral edema  Respiratory: Breath sounds are distant, with very minimal end expiratory wheezing.  No rhonchi, tachypnea, retractions.  No stridor. Abdomen: soft, nontender, nondistended, normal bowel tones heard  Skin: dry, no rashes  Musculoskeletal: no joint effusions, normal range of motion  Psychiatric: appropriate affect, normal speech  Neurologic: extraocular muscles intact, clear speech, moving all extremities with intact  sensorium         Labs on Admission:  Basic Metabolic Panel: Recent Labs  Lab 02/16/23 1727  NA 136  K 4.5  CL 93*  CO2 36*  GLUCOSE 117*  BUN 20  CREATININE 0.53  CALCIUM 9.2   Liver Function Tests: Recent Labs  Lab 02/16/23 1727  AST 18  ALT 16  ALKPHOS 49  BILITOT 0.6  PROT 7.0  ALBUMIN 3.9   Recent Labs  Lab 02/16/23 1727  LIPASE 34   No results for input(s): "AMMONIA" in the last 168 hours. CBC: Recent Labs  Lab 02/16/23 1727  WBC 7.8  HGB 12.0  HCT 38.8  MCV 97.2  PLT 242   Cardiac Enzymes: No results for input(s): "CKTOTAL", "CKMB", "CKMBINDEX", "TROPONINI" in the last 168 hours. BNP (last 3 results) No results for input(s): "BNP" in the last 8760 hours.  ProBNP (last 3 results) No results for input(s): "PROBNP" in the last 8760 hours.  CBG: No results for input(s): "GLUCAP" in the last 168 hours.  Radiological Exams on Admission: DG Chest 2 View  Result Date: 02/16/2023 CLINICAL DATA:  Cough, short of breath EXAM: CHEST - 2 VIEW COMPARISON:  01/19/2023, 01/18/2023 FINDINGS: Single frontal view of the chest demonstrates a stable cardiac silhouette. Stable ectasia and atherosclerosis of the thoracic aorta. Increased fluid within the right apical cavity seen previously. Severe bullous emphysema. Continued consolidation at the medial right lung base less pronounced since prior x-ray and CT. No evidence of pneumothorax. No acute bony abnormalities. IMPRESSION: 1. Increased fluid within a large right apical cavity, concerning for superinfection. 2. Severe emphysema, with continued consolidation at the right lung base consistent with improving pneumonia or resolving atelectasis. Electronically Signed   By: Sharlet Salina M.D.   On: 02/16/2023 18:06   Assessment/Plan Kristy Weeks is a 73 y.o. 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   Acute on chronic hypoxic respiratory failure-I suspect this is mainly due to recurrent acute exacerbation of her COPD, but could be a component of HCAP as well.  Increased fluid also noted in her chronic right apical pulmonary cavity. -Observation admission -Supplemental oxygen, wean as tolerated -Treat for suspected HCAP and AECOPD as below  AECOPD-patient has severe COPD at baseline, with chronic 3 L nasal cannula oxygen.  Was recently hospitalized at [facility] for acute exacerbation.  Now presents with significant dyspnea on exertion, hypoxia, increased sputum production. -Pulmicort neb twice daily, scheduled DuoNeb -As needed albuterol nebulizer -IV Solu-Medrol -Incentive spirometry and flutter valve  HCAP-concern due to subjective fever (sweats), increased cough with green sputum production.  Recent hospital stay.  Chest x-ray with continued consolidation. -Empiric IV cefepime and IV vancomycin  Chronic large right apical cavity, now with increased fluid noted.  History of MAC infection.  Pulmonology consultation requested, discussed with Dr. Wynona Neat for their guidance regarding any indication for further workup.  Hypertension and CAD-continue ASA, Plavix, Coreg, lisinopril  Anxiety-in the setting of acute on chronic hypoxia -Low-dose p.o. Xanax as needed  DVT prophylaxis: Lovenox     Code Status: Full Code  Consults called: Pulmonology  Admission status: The appropriate patient status for this patient is INPATIENT. Inpatient status is judged to be reasonable and necessary in order to provide the required intensity of service to ensure the patient's safety. The patient's presenting symptoms, physical exam findings, and initial radiographic and laboratory data  in the context of their chronic comorbidities is felt to place them at high risk for further clinical deterioration. Furthermore, it is not anticipated that the patient will be medically stable for discharge from the hospital within 2 midnights of admission.    I certify that at the point of admission it is my clinical judgment that the patient will require inpatient hospital care spanning beyond 2 midnights from the point of admission due to high intensity of service, high risk for further deterioration and high frequency of surveillance required  Time spent: 59 minutes  Lj Miyamoto Sharlette Dense MD [facility] Pager [phone]  If 7PM-7AM, please contact night-coverage [email or web address] Password [email or web address]  02/17/2023, 11:02 AM

## 2023-02-17 NOTE — ED Notes (Signed)
Pt given peanut butter crackers and cranberry juice. Okay for PO's per Dr. Lockie Mola.   Robina Ade, RN

## 2023-02-17 NOTE — ED Notes (Signed)
Attempted to call report. RN, Scarlette Calico, doing pt care and will call back.   Robina Ade, RN

## 2023-02-18 DIAGNOSIS — J690 Pneumonitis due to inhalation of food and vomit: Secondary | ICD-10-CM | POA: Diagnosis not present

## 2023-02-18 DIAGNOSIS — J9621 Acute and chronic respiratory failure with hypoxia: Secondary | ICD-10-CM | POA: Diagnosis not present

## 2023-02-18 DIAGNOSIS — J9620 Acute and chronic respiratory failure, unspecified whether with hypoxia or hypercapnia: Secondary | ICD-10-CM | POA: Diagnosis not present

## 2023-02-18 DIAGNOSIS — J441 Chronic obstructive pulmonary disease with (acute) exacerbation: Secondary | ICD-10-CM | POA: Diagnosis not present

## 2023-02-18 LAB — BASIC METABOLIC PANEL
Anion gap: 4 — ABNORMAL LOW (ref 5–15)
BUN: 21 mg/dL (ref 8–23)
CO2: 38 mmol/L — ABNORMAL HIGH (ref 22–32)
Calcium: 9.1 mg/dL (ref 8.9–10.3)
Chloride: 96 mmol/L — ABNORMAL LOW (ref 98–111)
Creatinine, Ser: 0.41 mg/dL — ABNORMAL LOW (ref 0.44–1.00)
GFR, Estimated: >60 mL/min (ref >60)
Glucose, Bld: 125 mg/dL — ABNORMAL HIGH (ref 70–99)
Potassium: 3.9 mmol/L (ref 3.5–5.1)
Sodium: 138 mmol/L (ref 135–145)

## 2023-02-18 LAB — CBC
HCT: 34.7 % — ABNORMAL LOW (ref 36.0–46.0)
Hemoglobin: 10.7 g/dL — ABNORMAL LOW (ref 12.0–15.0)
MCH: 30.2 pg (ref 26.0–34.0)
MCHC: 30.8 g/dL (ref 30.0–36.0)
MCV: 98 fL (ref 80.0–100.0)
Platelets: 243 10*3/uL (ref 150–400)
RBC: 3.54 MIL/uL — ABNORMAL LOW (ref 3.87–5.11)
RDW: 11.9 % (ref 11.5–15.5)
WBC: 6.4 10*3/uL (ref 4.0–10.5)
nRBC: 0 % (ref 0.0–0.2)

## 2023-02-18 MED ORDER — DM-GUAIFENESIN ER 30-600 MG PO TB12
1.0000 | ORAL_TABLET | Freq: Two times a day (BID) | ORAL | Status: AC
  Administered 2023-02-18 – 2023-02-20 (×6): 1 via ORAL
  Filled 2023-02-18 (×6): qty 1

## 2023-02-18 NOTE — Plan of Care (Signed)
  Problem: Nutrition: Goal: Adequate nutrition will be maintained Outcome: Progressing   

## 2023-02-18 NOTE — Hospital Course (Addendum)
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 In the ED: Vitals fairly stable afebrile CBC BMP with CO2 36 otherwise fairly stable, workup showed acute COPD exacerbation and acute on chronic hypoxia and possible aspiration pneumonia.  Pulmonary was consulted and patient was admitted Esophagogram>>Limited examination as described. 2. 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 3. Moderate intermittent esophageal dysmotility with tertiary contractions. 4. Small hiatal hernia. CXR>> Increased fluid within a large right apical cavity, concerning for superinfection. 2. Severe emphysema, with continued consolidation at the right lung base consistent with improving pneumonia or resolving atelectasis. Patient nicely improving MRSA negative, seen by PCCM and signed off advised to discharge on oral doxycycline x 7 days and a steroid taper.

## 2023-02-18 NOTE — Progress Notes (Signed)
Mobility Specialist - Progress Note   02/18/23 0852  Oxygen Therapy  SpO2 90 %  O2 Device Nasal Cannula  O2 Flow Rate (L/min) 6 L/min  Patient Activity (if Appropriate) Ambulating  Mobility  Activity Ambulated with assistance in hallway  Level of Assistance Modified independent, requires aide device or extra time  Assistive Device Other (Comment) (IV Pole)  Distance Ambulated (ft) 120 ft  Activity Response Tolerated well  Mobility Referral Yes  Mobility visit 1 Mobility  Mobility Specialist Start Time (ACUTE ONLY) 0840  Mobility Specialist Stop Time (ACUTE ONLY) 0851  Mobility Specialist Time Calculation (min) (ACUTE ONLY) 11 min   Pt received in bed and agreeable to mobility. Upon entering room, pt SpO2 at 73%. Encouraged pursed lip breaths allowing SpO2 to come back up to 91%. Ambulated pt on 6L with RN approval. Upon returning to room pt desat to 81%. Encouraged pursed lip breaths bringing SpO2 to 90%. Pt fatigued with exertion. No other complaints during session. Pt to bed after session with all needs met.  Pre-mobility: 73-81% SpO2 (6L [place]) During mobility: 90% SpO2 (6L [place]) Post-mobility: 81-90% SPO2 (6L [place])  Chief Technology Officer

## 2023-02-18 NOTE — Progress Notes (Signed)
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 In the ED: Vitals fairly stable afebrile CBC BMP with CO2 36 otherwise fairly stable, workup showed acute COPD exacerbation and acute on chronic hypoxia and possible aspiration pneumonia.  Pulmonary was consulted and patient was admitted  Esophagogram>>Limited examination as described. 2. 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 3. Moderate intermittent esophageal dysmotility with tertiary contractions. 4. Small hiatal hernia. CXR>> Increased fluid within a large right apical cavity, concerning for superinfection. 2. Severe emphysema, with continued consolidation at the right lung base consistent with improving pneumonia or resolving atelectasis.     Subjective: Seen and examined this morning.  Doing well on 4 L at rest on ambulation at 6 L She feels some improvement today.  Her son is at the bedside  Assessment and Plan: Principal Problem:   Acute on chronic respiratory failure with hypoxemia (HCC) Active Problems:   Acute on chronic hypoxic respiratory failure (HCC)   Acute on chronic hypoxic respiratory failure Acute COPD exacerbation Severe emphysema: Appreciate pulmonary input.  Continue supplemental oxygen, IV Solu-Medrol, DuoNeb 4 times daily. Cont plan per PCCm. 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 FU sputum Gram stain culture.  Esophagogram with moderate esophageal dysmotility, patulous esophagus  GERD Patulous esophagus: Continue PPI.  CAD Hypertension: BP stable.  Continue her aspirin Plavix home Coreg and lisinopril  Anxiety disorder: Continue low-dose Xanax prn  Chronic anemia: hemoglobin around 10 g-12 at baseline.Monitor  Severe malnutrition with low Body mass index is 16.82 kg/m. :RD consulted augment diet  DVT prophylaxis: enoxaparin (LOVENOX) injection 30 mg Start: 02/18/23 1200 SCDs Start: 02/17/23 1102 Code Status:   Code Status: Full Code Family Communication: plan of care discussed with patient/family at bedside. Patient status is: Remains hospitalized because of severity of illness Level of care: Med-Surg   Dispo: The patient is from: Home with her son            Anticipated disposition: TBD 1-2days Objective: Vitals last 24 hrs: Vitals:   02/18/23 0539 02/18/23 0826 02/18/23 0829 02/18/23 0852  BP: 121/73     Pulse: 66     Resp: 16     Temp: 97.7 F (36.5 C)     TempSrc: Oral     SpO2: 100% 92% 92% 90%  Weight:      Height:       Weight change:   Physical Examination: General exam: alert awake,at baseline, older than stated age HEENT:Oral mucosa moist, Ear/Nose WNL grossly Respiratory system: Bilaterally diminished breath sounds with crackles,no use of accessory muscle Cardiovascular system: S1 & S2 +, No JVD. Gastrointestinal system: Abdomen soft,NT,ND, BS+ Nervous System: Alert, awake, moving all extremities,and following commands. Extremities: LE edema neg,distal peripheral pulses palpable and warm.  Skin: No rashes,no icterus. MSK: Normal muscle bulk,tone, power   Medications reviewed:  Scheduled Meds:  aspirin EC  81 mg Oral Daily   atorvastatin  40 mg Oral QHS   budesonide  0.5 mg  Nebulization BID   carvedilol  6.25 mg Oral BID WC   clopidogrel  75 mg Oral Daily   enoxaparin (LOVENOX) injection  30 mg Subcutaneous Q24H   ipratropium-albuterol  3 mL Nebulization QID   lisinopril  10 mg Oral QHS   methylPREDNISolone (SOLU-MEDROL) injection  40 mg Intravenous Daily   Continuous Infusions:  ceFEPime (MAXIPIME) IV 2 g (02/18/23 0813)   vancomycin 1,000 mg (02/18/23 0935)     Diet Order             Diet regular Room service appropriate? Yes; Fluid consistency: Thin  Diet effective now                  Intake/Output Summary (Last 24 hours) at 02/18/2023 1023 Last data filed at 02/18/2023 0600 Gross per 24 hour  Intake 930.73 ml  Output 500 ml  Net 430.73 ml   Net IO Since Admission: 430.73 mL [02/18/23 1023]  Wt Readings from Last 3 Encounters:  02/16/23 44.5 kg  01/18/23 46 kg  10/14/22 45.4 kg     Unresulted Labs (From admission, onward)     Start     Ordered   02/19/23 0500  CBC  Daily,   R      02/18/23 0759   02/19/23 0500  Basic metabolic panel  Daily,   R      02/18/23 0759          Data Reviewed: I have personally reviewed following labs and imaging studies CBC: Recent Labs  Lab 02/16/23 1727 02/18/23 0440  WBC 7.8 6.4  HGB 12.0 10.7*  HCT 38.8 34.7*  MCV 97.2 98.0  PLT 242 243   Basic Metabolic Panel:  Recent Labs  Lab 02/16/23 1727 02/18/23 0440  NA 136 138  K 4.5 3.9  CL 93* 96*  CO2 36* 38*  GLUCOSE 117* 125*  BUN 20 21  CREATININE 0.53 0.41*  CALCIUM 9.2 9.1   GFR: Estimated Creatinine Clearance: 43.3 mL/min (A) (by C-G formula based on SCr of 0.41 mg/dL (L)). Liver Function Tests:  Recent Labs  Lab 02/16/23 1727  AST 18  ALT 16  ALKPHOS 49  BILITOT 0.6  PROT 7.0  ALBUMIN 3.9   Recent Labs  Lab 02/16/23 1727  LIPASE 34  No results for input(s): "PROCALCITON", "LATICACIDVEN" in the last 168 hours. Recent Results (from the past 240 hours)  Resp panel by RT-PCR (RSV, Flu A&B, Covid) Anterior Nasal  Swab     Status: None   Collection Time: 02/16/23  5:27 PM   Specimen: Anterior Nasal Swab  Result Value Ref Range Status   SARS Coronavirus 2 by RT PCR NEGATIVE NEGATIVE Final    Comment: (NOTE) SARS-CoV-2 target nucleic acids are NOT DETECTED.  The SARS-CoV-2 RNA is generally detectable in upper respiratory specimens during the acute phase of infection. The lowest concentration of SARS-CoV-2 viral copies this assay can detect is 138 copies/mL. A negative result does not preclude SARS-Cov-2 infection and should not be used as the sole basis for treatment or other patient management decisions. A negative result may occur with  improper specimen collection/handling, submission of specimen other than nasopharyngeal swab, presence of viral mutation(s) within the areas targeted by this assay, and inadequate number of viral copies(<138 copies/mL). A negative result must be combined with clinical observations, patient history, and epidemiological  information. The expected result is Negative.  Fact Sheet for Patients:  BloggerCourse.com  Fact Sheet for Healthcare Providers:  SeriousBroker.it  This test is no t yet approved or cleared by the [place] FDA and  has been authorized for detection and/or diagnosis of SARS-CoV-2 by FDA under an Emergency Use Authorization (EUA). This EUA will remain  in effect (meaning this test can be used) for the duration of the COVID-19 declaration under Section 564(b)(1) of the Act, 21 U.S.C.section 360bbb-3(b)(1), unless the authorization is terminated  or revoked sooner.       Influenza A by PCR NEGATIVE NEGATIVE Final   Influenza B by PCR NEGATIVE NEGATIVE Final    Comment: (NOTE) The Xpert Xpress SARS-CoV-2/FLU/RSV plus assay is intended as an aid in the diagnosis of influenza from Nasopharyngeal swab specimens and should not be used as a sole basis for treatment. Nasal washings and aspirates  are unacceptable for Xpert Xpress SARS-CoV-2/FLU/RSV testing.  Fact Sheet for Patients: BloggerCourse.com  Fact Sheet for Healthcare Providers: SeriousBroker.it  This test is not yet approved or cleared by the [place] FDA and has been authorized for detection and/or diagnosis of SARS-CoV-2 by FDA under an Emergency Use Authorization (EUA). This EUA will remain in effect (meaning this test can be used) for the duration of the COVID-19 declaration under Section 564(b)(1) of the Act, 21 U.S.C. section 360bbb-3(b)(1), unless the authorization is terminated or revoked.     Resp Syncytial Virus by PCR NEGATIVE NEGATIVE Final    Comment: (NOTE) Fact Sheet for Patients: BloggerCourse.com  Fact Sheet for Healthcare Providers: SeriousBroker.it  This test is not yet approved or cleared by the [place] FDA and has been authorized for detection and/or diagnosis of SARS-CoV-2 by FDA under an Emergency Use Authorization (EUA). This EUA will remain in effect (meaning this test can be used) for the duration of the COVID-19 declaration under Section 564(b)(1) of the Act, 21 U.S.C. section 360bbb-3(b)(1), unless the authorization is terminated or revoked.  Performed at [facility], [address] [place], [place] [address]   Expectorated Sputum Assessment w Gram Stain, Rflx to Resp Cult     Status: None   Collection Time: 02/17/23  2:44 PM   Specimen: Sputum  Result Value Ref Range Status   Specimen Description SPUTUM  Final   Special Requests NONE  Final   Sputum evaluation   Final    THIS SPECIMEN IS ACCEPTABLE FOR SPUTUM CULTURE Performed at [facility], [address], [place], [place] [address]    Report Status 02/17/2023 FINAL  Final  Culture, Respiratory w Gram Stain     Status: None (Preliminary result)   Collection Time: 02/17/23  2:44 PM    Specimen: SPU  Result Value Ref Range Status   Specimen Description   Final    SPUTUM Performed at [facility], [address], [place], [place] [address]    Special Requests   Final    NONE Reflexed from [id number] Performed at [facility], [address] [place], [place] [address]    Gram Stain   Final    FEW WBC PRESENT, PREDOMINANTLY MONONUCLEAR RARE GRAM POSITIVE COCCI IN PAIRS IN SINGLES Performed at [facility], [address], [place], [place] [address]    Culture PENDING  Incomplete   Report Status PENDING  Incomplete  MRSA Next Gen by PCR, Nasal     Status: None   Collection Time: 02/17/23  5:00 PM   Specimen: Nasal Mucosa; Nasal Swab  Result Value Ref Range Status   MRSA by PCR Next Gen NOT DETECTED NOT DETECTED Final    Comment: (NOTE) The GeneXpert MRSA Assay (FDA approved for NASAL specimens only), is one component of a comprehensive MRSA colonization surveillance program. It is not intended to diagnose MRSA infection nor to guide or monitor treatment for MRSA infections. Test performance is not FDA approved in patients less than 63 years old. Performed at [facility], [address], [place], [place] [address]     Antimicrobials/Microbiology: Anti-infectives (From admission, onward)    Start     Dose/Rate Route Frequency Ordered Stop   02/18/23 1200  vancomycin (VANCOREADY) IVPB 1250 mg/250 mL  Status:  Discontinued        1,250 mg 166.7 mL/hr over 90 Minutes Intravenous Every 48 hours 02/17/23 0818 02/17/23 1204   02/18/23 1000  vancomycin (VANCOCIN) IVPB 1000 mg/200 mL premix        1,000 mg 200 mL/hr over 60 Minutes Intravenous Every 36 hours 02/17/23 1204     02/17/23 0830  ceFEPIme (MAXIPIME) 2 g in sodium chloride 0.9 % 100 mL IVPB        2 g 200 mL/hr over 30 Minutes Intravenous Every 12 hours 02/17/23 0814     02/16/23 2130  aztreonam (AZACTAM) 2 g in sodium chloride 0.9 % 100 mL  IVPB        2 g 200 mL/hr over 30 Minutes Intravenous  Once 02/16/23 2122 02/16/23 2226   02/16/23 2130  vancomycin (VANCOCIN) IVPB 1000 mg/200 mL premix        1,000 mg 200 mL/hr over 60 Minutes Intravenous  Once 02/16/23 2122 02/17/23 0016         Component Value Date/Time   SDES SPUTUM 02/17/2023 1444   SDES  02/17/2023 1444    SPUTUM Performed at [facility], [address] [place], [place] [address]    SPECREQUEST NONE 02/17/2023 1444   SPECREQUEST  02/17/2023 1444    NONE Reflexed from [id number] Performed at [facility], [address] [place], [place] [address]    CULT PENDING 02/17/2023 1444   REPTSTATUS 02/17/2023 FINAL 02/17/2023 1444   REPTSTATUS PENDING 02/17/2023 1444     Radiology Studies: DG ESOPHAGUS W SINGLE CM (SOL OR THIN BA) Result Date: 02/17/2023 CLINICAL DATA:  Provided history: Reflux laryngitis. Additional history: The patient reports shortness of breath, cough, aspiration pneumonia. EXAM: ESOPHOGRAM/BARIUM SWALLOW TECHNIQUE: A single contrast examination was performed using thin barium. FLUOROSCOPY: Radiation Exposure Index (as provided by the fluoroscopic device): 26.20 mGy Kerma COMPARISON:  Chest CT 01/19/2023. FINDINGS: Limited examination. Due to patient shortness of breath, the examination was performed with the fluoroscopy table at an approximate 60 degree angle, and patient repositioning was limited. Within these limitations, findings as follows. 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 No evidence of a fixed stricture, mass or mucosal abnormality. Moderate intermittent esophageal dysmotility with tertiary contractions. Small hiatal hernia. No spontaneous gastroesophageal reflux was observed. The patient swallowed a 13 mm barium tablet, which freely passed into the stomach. IMPRESSION: 1. Limited examination as described. 2. 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 3. Moderate intermittent esophageal dysmotility  with tertiary contractions. 4. Small hiatal hernia. 5. Within described limitations, no spontaneous gastroesophageal reflux observed. Electronically Signed   By: Jackey Loge D.O.   On: 02/17/2023 16:00   DG Chest 2 View Result Date: 02/16/2023 CLINICAL DATA:  Cough, short of breath  EXAM: CHEST - 2 VIEW COMPARISON:  01/19/2023, 01/18/2023 FINDINGS: Single frontal view of the chest demonstrates a stable cardiac silhouette. Stable ectasia and atherosclerosis of the thoracic aorta. Increased fluid within the right apical cavity seen previously. Severe bullous emphysema. Continued consolidation at the medial right lung base less pronounced since prior x-ray and CT. No evidence of pneumothorax. No acute bony abnormalities. IMPRESSION: 1. Increased fluid within a large right apical cavity, concerning for superinfection. 2. Severe emphysema, with continued consolidation at the right lung base consistent with improving pneumonia or resolving atelectasis. Electronically Signed   By: Sharlet Salina M.D.   On: 02/16/2023 18:06     LOS: 1 day   Total time spent in review of labs and imaging, patient evaluation, formulation of plan, documentation and communication with family: 35 minutes  Lanae Boast, MD  [facility]  02/18/2023, 10:23 AM

## 2023-02-18 NOTE — Progress Notes (Addendum)
NAME:  Kristy Weeks, MRN:  [id number], DOB:  05-19-1950, LOS: 1 ADMISSION DATE:  02/16/2023, CONSULTATION DATE:  1/21 REFERRING MD:  Kirby Crigler, CHIEF COMPLAINT:  acute on chronic resp failure    History of Present Illness:  Kristy Weeks is a 73 y.o. who was just Discharged home in Dec on steroid taper and azith. Seen in f/u w/ [facility]m on 12/30. At that point back on her low dose pred alternating 10 mg and 20 mg EOD and  M/W/F azith. Was supposed to have f/u CT in Feb. Presents to ER 1/20 w/ 7-10d h/o increased cough which was productive of green sputum, increased SOB, both at rest and w/ exertion. She actually started taking her azithro daily about 48 hrs prior to coming to the ER and was starting to feel a little better but still had significant symptom burden hence her arrival to ER . Started on antibiotics and steroids in addition to supplemental oxygen and bronchodilators. Pcxr w/ known right apical cavitary process, right basilar airspace disease w/ possible effusion, left lung clear. IM consulted pulm re: thoughts on etiology of infection and to make recs on imaging if needed.   Pertinent  Medical History  Chronic hypoxic resp failure 3 lpm baseline, prior MAC w. Chronic right apical cavitation. COPD, CAD, HTN. Recent admit December at [facility] for AECOPD and PNA.  Significant Hospital Events: Including procedures, antibiotic start and stop dates in addition to other pertinent events   1/20 admitted w/ recurrent acute on chronic resp failure 2/2 AECOPD vs possible asp 1/22 No issues overnight, remains on 3L [place].   Interim History / Subjective:  States she feels a bit better overall but not back to baseline yet.   Objective   Blood pressure 121/73, pulse 66, temperature 97.7 F (36.5 C), temperature source Oral, resp. rate 16, height 5\' 4"  (1.626 m), weight 44.5 kg, SpO2 90%.        Intake/Output Summary (Last 24 hours) at 02/18/2023 1035 Last data filed at 02/18/2023 1000 Gross  per 24 hour  Intake 1050.73 ml  Output 1000 ml  Net 50.73 ml   Filed Weights   02/16/23 1718  Weight: 44.5 kg    Examination: General: Acute on c ill appearing elderly female sitting up in bed, in NAD HEENT: [place]/AT, MM pink/moist, PERRL,  Neuro: Alert and oriented x3, non-focal  CV: s1s2 regular rate and rhythm, no murmur, rubs, or gallops,  PULM:  Diminished bilaterally, no increased work of breathing,  GI: soft, bowel sounds active in all 4 quadrants, non-tender, non-distended, tolerating oral diet Extremities: warm/dry, no edema  Skin: no rashes or lesions  Resolved Hospital Problem list     Assessment & Plan:  Acute on chronic resp failure AECOPD Prob aspiration PNA Remote MAC (s/p 1 yr of rx guided by ID) Chronic RUL cavitary lesion CAD HTN Reflux Patulous esophagus    Pulm prob list Acute on chronic hypoxic respiratory failure 2/2 acute purulent bronchitis, probable recurrent aspiration (in setting of patulous esophagus) and AECOPD -History of MAI infection usually follows up at [facility], has an appointment coming up in about 3 weeks  -Esophagram completed 1/21 that showed some dysmotility, hiatal hernia but no reflux, study was limited.  P: Continue empiric Cefepime and  Continue supplemental oxygen, wean as able  Continue IV Solumedrol  Continue BDs and ICS SLP Following, appreciate assistance  Outpatient follow up with [facility]   Best Practice (right click and "Reselect all SmartList Selections" daily)   Per  primary   Critical care time: NA  Deneisha Dade D. Harris, NP-C [facility] Personal contact information can be found on Amion  If no contact or response made please call [phone] 02/18/2023, 10:36 AM

## 2023-02-19 DIAGNOSIS — J441 Chronic obstructive pulmonary disease with (acute) exacerbation: Secondary | ICD-10-CM | POA: Diagnosis not present

## 2023-02-19 DIAGNOSIS — J690 Pneumonitis due to inhalation of food and vomit: Secondary | ICD-10-CM | POA: Diagnosis not present

## 2023-02-19 DIAGNOSIS — J9621 Acute and chronic respiratory failure with hypoxia: Secondary | ICD-10-CM | POA: Diagnosis not present

## 2023-02-19 LAB — CBC
HCT: 34.7 % — ABNORMAL LOW (ref 36.0–46.0)
Hemoglobin: 10.3 g/dL — ABNORMAL LOW (ref 12.0–15.0)
MCH: 30.1 pg (ref 26.0–34.0)
MCHC: 29.7 g/dL — ABNORMAL LOW (ref 30.0–36.0)
MCV: 101.5 fL — ABNORMAL HIGH (ref 80.0–100.0)
Platelets: 215 10*3/uL (ref 150–400)
RBC: 3.42 MIL/uL — ABNORMAL LOW (ref 3.87–5.11)
RDW: 11.9 % (ref 11.5–15.5)
WBC: 7.4 10*3/uL (ref 4.0–10.5)
nRBC: 0 % (ref 0.0–0.2)

## 2023-02-19 LAB — BASIC METABOLIC PANEL
Anion gap: 8 (ref 5–15)
BUN: 20 mg/dL (ref 8–23)
CO2: 32 mmol/L (ref 22–32)
Calcium: 8.5 mg/dL — ABNORMAL LOW (ref 8.9–10.3)
Chloride: 98 mmol/L (ref 98–111)
Creatinine, Ser: 0.74 mg/dL (ref 0.44–1.00)
GFR, Estimated: >60 mL/min (ref >60)
Glucose, Bld: 97 mg/dL (ref 70–99)
Potassium: 3.5 mmol/L (ref 3.5–5.1)
Sodium: 138 mmol/L (ref 135–145)

## 2023-02-19 MED ORDER — BOOST PLUS PO LIQD
237.0000 mL | Freq: Two times a day (BID) | ORAL | Status: DC
  Administered 2023-02-19 – 2023-02-21 (×2): 237 mL via ORAL
  Filled 2023-02-19 (×5): qty 237

## 2023-02-19 MED ORDER — ADULT MULTIVITAMIN W/MINERALS CH
1.0000 | ORAL_TABLET | Freq: Every day | ORAL | Status: DC
  Administered 2023-02-20 – 2023-02-21 (×2): 1 via ORAL
  Filled 2023-02-19 (×2): qty 1

## 2023-02-19 NOTE — Progress Notes (Signed)
Initial Nutrition Assessment  DOCUMENTATION CODES:   Severe malnutrition in context of chronic illness  INTERVENTION:  - Regular diet.  - Boost Plus po BID, each supplement provides 360 kcal and 14 grams of protein - Encourage intake of small and frequent meals in addition to supplements.  - Multivitamin with minerals daily - Monitor weight trends.   NUTRITION DIAGNOSIS:   Severe Malnutrition related to chronic illness (severe COPD) as evidenced by severe fat depletion, severe muscle depletion.  GOAL:   Patient will meet greater than or equal to 90% of their needs  MONITOR:   PO intake, Supplement acceptance, Weight trends  REASON FOR ASSESSMENT:   Consult Assessment of nutrition requirement/status  ASSESSMENT:   73 y.o. f w/ severe COPD and chronic hypoxic respiratory failure on 3L [place], HTN, CAD who is admitted for acute COPD exacerbation.  Patient reports a UBW of 98# and endorses slow and steady weight loss over the past year.  Per EMR, earliest weight over the past year is from September, when patient was weighed at 100#. She is now weighed at 98#. Patient does feel that recently she has been maintaining/gaining due to eating better.   Typically eats every 2-3 hours at home as this is better tolerated. Also drinks Ensure/Boost once a day, occasionally more.   Current appetite is normal and patient reports eating well this admission. Documented to be consuming 50-100% of meals with average of 85%. She is agreeable to receive Boost (prefers over Ensure) twice daily to support intake during admission. Encouraged continued intake of small and frequent meals in addition to supplements.    Medications reviewed and include: -  Labs reviewed:  -   NUTRITION - FOCUSED PHYSICAL EXAM:  Flowsheet Row Most Recent Value  Orbital Region Mild depletion  Upper Arm Region Severe depletion  Thoracic and Lumbar Region Severe depletion  Buccal Region Moderate depletion  Temple  Region Moderate depletion  Clavicle Bone Region Severe depletion  Clavicle and Acromion Bone Region Severe depletion  Scapular Bone Region Unable to assess  Dorsal Hand Severe depletion  Patellar Region Severe depletion  Anterior Thigh Region Severe depletion  Posterior Calf Region Severe depletion  Edema (RD Assessment) None  Hair Reviewed  Eyes Reviewed  Mouth Reviewed  [missing teeth]  Skin Reviewed  Nails Reviewed       Diet Order:   Diet Order             Diet regular Room service appropriate? Yes; Fluid consistency: Thin  Diet effective now                   EDUCATION NEEDS:  Education needs have been addressed  Skin:  Skin Assessment: Reviewed RN Assessment  Last BM:  1/19  Height:  Ht Readings from Last 1 Encounters:  02/16/23 5\' 4"  (1.626 m)   Weight:  Wt Readings from Last 1 Encounters:  02/16/23 44.5 kg    BMI:  Body mass index is 16.82 kg/m.  Estimated Nutritional Needs:  Kcal:  1550-1800 kcals Protein:  75-90 grams Fluid:  >/= 1.6L    Shelle Iron RD, LDN Contact via Secure Chat.

## 2023-02-19 NOTE — TOC Initial Note (Signed)
Transition of Care [facility]) - Initial/Assessment Note    Patient Details  Name: Kristy Weeks MRN: [id number] Date of Birth: 11/18/1950  Transition of Care [facility]) CM/SW Contact:    Amada Jupiter, LCSW Phone Number: 02/19/2023, 1:50 PM  Clinical Narrative:                  Met with pt to review for possible dc needs.  Pt very pleasant and confirms that her son and his girlfriend live with her but are both working.  Adds that they are very supportive and pt is independent overall at baseline.  Confirms that she already has O2 in the home supplied by [employer] and that her "normal" liter use is 2-3 L. Has been able to wean down to 3L here.  Denies any concerns about home dc and anticipates she may be cleared by tomorrow.  CSW will continue to monitor if any new orders needed for O2.    Expected Discharge Plan: Home/Self Care Barriers to Discharge: Continued Medical Work up   Patient Goals and CMS Choice Patient states their goals for this hospitalization and ongoing recovery are:: return home          Expected Discharge Plan and Services       Living arrangements for the past 2 months: Single Family Home                 DME Arranged: N/A DME Agency: NA                  Prior Living Arrangements/Services Living arrangements for the past 2 months: Single Family Home Lives with:: Adult Children Patient language and need for interpreter reviewed:: Yes Do you feel safe going back to the place where you live?: Yes      Need for Family Participation in Patient Care: Yes (Comment) Care giver support system in place?: Yes (comment)   Criminal Activity/Legal Involvement Pertinent to Current Situation/Hospitalization: No - Comment as needed  Activities of Daily Living   ADL Screening (condition at time of admission) Independently performs ADLs?: Yes (appropriate for developmental age) Is the patient deaf or have difficulty hearing?: No Does the patient have difficulty  seeing, even when wearing glasses/contacts?: No Does the patient have difficulty concentrating, remembering, or making decisions?: No  Permission Sought/Granted Permission sought to share information with : Family Supports Permission granted to share information with : Yes, Verbal Permission Granted  Share Information with NAME: son, Annelisse Southerland @ [phone]           Emotional Assessment Appearance:: Appears stated age Attitude/Demeanor/Rapport: Gracious Affect (typically observed): Accepting, Pleasant Orientation: : Oriented to Self, Oriented to Place, Oriented to  Time, Oriented to Situation Alcohol / Substance Use: Not Applicable Psych Involvement: No (comment)  Admission diagnosis:  COPD exacerbation (HCC) [J44.1] Acute on chronic respiratory failure with hypoxemia (HCC) [J96.21] Pneumonia of right upper lobe due to infectious organism [J18.9] Acute on chronic hypoxic respiratory failure (HCC) [J96.21] Patient Active Problem List   Diagnosis Date Noted   Acute on chronic hypoxic respiratory failure (HCC) 02/17/2023   Acute on chronic respiratory failure with hypoxemia (HCC) 02/16/2023   Acute respiratory failure with hypoxia (HCC) 01/27/2018   Hypertension    COPD (chronic obstructive pulmonary disease) (HCC)    Coronary artery disease    Acute on chronic respiratory failure with hypoxia (HCC) 01/25/2018   PCP:  Bailey Mech, PA-C Pharmacy:   [facility] [phone] - [place],  - [address] AT [facility] [address] [place] [address] Phone: [phone] Fax: [phone]     Social Drivers of Health (SDOH) Social History: SDOH Screenings   Food Insecurity: Patient Declined (02/17/2023)  Housing: Patient Declined (02/17/2023)  Transportation Needs: Patient Declined (02/17/2023)  Utilities: Patient Declined (02/17/2023)  Social Connections: Patient Declined (02/17/2023)  Tobacco Use: Medium Risk (02/16/2023)    SDOH Interventions:     Readmission Risk Interventions    02/19/2023    1:48 PM  Readmission Risk Prevention Plan  Post Dischage Appt Complete  Medication Screening Complete  Transportation Screening Complete

## 2023-02-19 NOTE — Progress Notes (Signed)
PHARMACY NOTE -  Cefepime  Pharmacy has been assisting with dosing of cefepime for AECOPD/aspiration PNA. Dosage remains stable at 2g IV q12 hr and further renal adjustments per institutional Pharmacy antibiotic protocol  Pharmacy will sign off, following peripherally for culture results, dose adjustments, and length of therapy. Please reconsult if a change in clinical status warrants re-evaluation of dosage.  Bernadene Person, PharmD, BCPS [phone] 02/19/2023, 12:42 PM

## 2023-02-19 NOTE — Progress Notes (Signed)
Mobility Specialist - Progress Note   02/19/23 0855  Oxygen Therapy  SpO2 90 %  O2 Device Nasal Cannula  O2 Flow Rate (L/min) 6 L/min  Patient Activity (if Appropriate) Ambulating  Mobility  Activity Ambulated with assistance in hallway  Level of Assistance Modified independent, requires aide device or extra time  Assistive Device Front wheel walker  Distance Ambulated (ft) 120 ft  Activity Response Tolerated well  Mobility Referral Yes  Mobility visit 1 Mobility  Mobility Specialist Start Time (ACUTE ONLY) [id number]  Mobility Specialist Stop Time (ACUTE ONLY) 0854  Mobility Specialist Time Calculation (min) (ACUTE ONLY) 17 min   Pt received in bed and agreeable to mobility. Pt took x2 standing rest breaks d/t SOB. Upon returning to room, pt desat to 87%. SpO2 back up to 93% after pursed lip breaths. Pt to EOB after session with all needs met.     Pre-mobility: 92% SpO2 (6L [place]) During mobility: 90% SpO2 (6L [place]) Post-mobility: 87-93% SPO2 (6L [place])  Chief Technology Officer

## 2023-02-19 NOTE — Evaluation (Signed)
Clinical/Bedside Swallow Evaluation Patient Details  Name: Kristy Weeks MRN: [id number] Date of Birth: 05/18/1950  Today's Date: 02/19/2023 Time: SLP Start Time (ACUTE ONLY): 1540 SLP Stop Time (ACUTE ONLY): 1601 SLP Time Calculation (min) (ACUTE ONLY): 21 min  Past Medical History:  Past Medical History:  Diagnosis Date   COPD (chronic obstructive pulmonary disease) (HCC)    wears 2L most of the time   Coronary artery disease    Hypertension    Past Surgical History:  Past Surgical History:  Procedure Laterality Date   CORONARY ANGIOPLASTY WITH STENT PLACEMENT     HPI:  73 yo female adm to [facility] on 02/17/2023 with acute hypoxia. Pt with PMH + for COPD - on 3 liters oxygen at baseline.  Has h/o MAI infection and reflux. Underwent esophagram that showed patulous mid to upper thoracic esophagus, moderate intermittent esophageal dysmotility with tertiary contractions, small hiatal hernia, and no spontaneous GER observed.  Study was limited per radiologist due to pt's limitations.  CCM ordered SLP swallow evaluation as well.  Pt reportrs she wishes her swallowing was a "little bit better".  Admits to having discomfort - heavy thump sensation in chest when swallowing carbonated beverage yesterday and also occasional sensation of lodging in pharynx. Takes reflux medications PRN but denies having significant issues with it.  Denies requiring heimlich manuever nor regurgitation.    Assessment / Plan / Recommendation  Clinical Impression  CN exam was unremarkable, pt is xerostomic and had poor dentition. Her speech and voice clinically judged to be clear.   Pt observed with minimal intake as she states she was full from just having a milkshake at 1500.  Intake included water, ice cream and few bites of graham cracker.    Subtle cough noted after swallowing approx 30% of water boluses.  No clinical indication of aspiration with ice cream nor graham crackers.  SLP admits to liquids being easier to  clear than solids  and states she is consuming liquid nutritional supplements.  She does endorse xerostomia and thus provided her with compensation strategies including drinking plenty of liquids during meals, trying biotene/dry mouth products, using sugar free lemon candy, etc, drinking plenty of water, etc.  Also advised her to dysmotility noted on her esophagram and provided her with compensations using written instructions and feedback.  Advised pt if she is dyspenic to assure she it taking appropriate rest breaks.    SLP provided all education for compensations to mitigate her dysphagia.  No SLP follow up indicated. Thanks for this consult. SLP Visit Diagnosis: Dysphagia, unspecified (R13.10)    Aspiration Risk  Mild aspiration risk    Diet Recommendation Regular;Thin liquid    Liquid Administration via: Straw;Cup Medication Administration: Whole meds with liquid Compensations: Slow rate;Small sips/bites Postural Changes: Seated upright at 90 degrees;Remain upright for at least 30 minutes after po intake    Other  Recommendations Oral Care Recommendations: Oral care BID    Recommendations for follow up therapy are one component of a multi-disciplinary discharge planning process, led by the attending physician.  Recommendations may be updated based on patient status, additional functional criteria and insurance authorization.  Follow up Recommendations No SLP follow up      Assistance Recommended at Discharge  N/a  Functional Status Assessment Patient has not had a recent decline in their functional status  Frequency and Duration      N/a      Prognosis   N/a     Swallow Study   General  Date of Onset: 02/19/23 HPI: 73 yo female adm to [facility] on 02/17/2023 with acute hypoxia. Pt with PMH + for COPD - on 3 liters oxygen at baseline.  Has h/o MAI infection and reflux. Underwent esophagram that showed patulous mid to upper thoracic esophagus, moderate intermittent esophageal dysmotility  with tertiary contractions, small hiatal hernia, and no spontaneous GER observed.  Study was limited per radiologist due to pt's limitations.  CCM ordered SLP swallow evaluation as well.  Pt reportrs she wishes her swallowing was a "little bit better".  Admits to having discomfort - heavy thump sensation in chest when swallowing carbonated beverage yesterday and also occasional sensation of lodging in pharynx. Takes reflux medications PRN but denies having significant issues with it.  Denies requiring heimlich manuever nor regurgitation. Diet Prior to this Study: Regular;Thin liquids (Level 0) Temperature Spikes Noted: No Respiratory Status: Nasal cannula (3) Behavior/Cognition: Alert;Cooperative;Pleasant mood Oral Cavity Assessment: Dry Oral Care Completed by SLP: No Oral Cavity - Dentition: Missing dentition;Other (Comment) (few dentition present) Vision: Functional for self-feeding Self-Feeding Abilities: Able to feed self Patient Positioning: Upright in bed Baseline Vocal Quality: Normal Volitional Cough: Strong Volitional Swallow: Able to elicit    Oral/Motor/Sensory Function Overall Oral Motor/Sensory Function: Within functional limits   Ice Chips Ice chips: Not tested   Thin Liquid Other Comments: cough noted x4/6 boluses    Nectar Thick Nectar Thick Liquid: Not tested   Honey Thick Honey Thick Liquid: Not tested   Puree Puree: Within functional limits Presentation: Self Fed;Spoon   Solid     Solid: Within functional limits Presentation: Self Fed      Chales Abrahams 02/19/2023,4:28 PM  Rolena Infante, MS [facility]patient SLP Acute Rehab Services Office [phone]

## 2023-02-19 NOTE — Progress Notes (Signed)
Pulmonary will sign off  Can de-escalate antibiotics to doxycycline 100 p.o. twice daily for 7 days  [facility]-she does have a follow-up appointment scheduled with them already

## 2023-02-19 NOTE — Progress Notes (Signed)
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 In the ED: Vitals fairly stable afebrile CBC BMP with CO2 36 otherwise fairly stable, workup showed acute COPD exacerbation and acute on chronic hypoxia and possible aspiration pneumonia.  Pulmonary was consulted and patient was admitted Esophagogram>>Limited examination as described. 2. 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 3. Moderate intermittent esophageal dysmotility with tertiary contractions. 4. Small hiatal hernia. CXR>> Increased fluid within a large right apical cavity, concerning for superinfection. 2. Severe emphysema, with continued consolidation at the right lung base consistent with improving pneumonia or resolving atelectasis.     Subjective: Patient reports he feels overall improving but slow.  Ambulated this morning needing 6 L nasal cannula and hypoxic up to 87%  Less coughing.  At home oxygen goes up to 4.5 L No new complaints  Assessment and Plan: Principal Problem:   Acute on chronic respiratory failure with hypoxemia (HCC) Active Problems:   Acute on chronic hypoxic respiratory failure (HCC)   Acute on chronic hypoxic respiratory failure Acute COPD exacerbation Severe emphysema: Slow to improve.  But feels getting better every day.  Needing 6 L [place].  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  Continue cefepime .  GERD Patulous esophagus: Esophagogram with moderate esophageal dysmotility, patulous esophagus. Continue PPI.  CAD Hypertension: BP stable.  No chest pain. Cont on Aspirin Plavix Coreg and lisinopril.  Anxiety disorder: Continue low-dose Xanax prn, mood stable.  Chronic anemia: hemoglobin around 10 g-12 at baseline.Monitor while here  Severe malnutrition with low Body mass index is 16.82 kg/m. :RD consulted augment diet Nutrition Status:          DVT prophylaxis: enoxaparin (LOVENOX) injection 30 mg Start: 02/18/23 1200 SCDs Start: 02/17/23 1102 Code Status:   Code Status: Full Code Family Communication: plan of care discussed with patient/no family at bedside. Patient status is: Remains hospitalized because of severity of illness Level of care: Med-Surg   Dispo: The patient is from: Home with her son            Anticipated disposition: TBD 1-2days Objective: Vitals last 24 hrs: Vitals:   02/19/23 0814 02/19/23 0816 02/19/23 0817 02/19/23 0855  BP:      Pulse:      Resp:      Temp:      TempSrc:      SpO2: 90% 90% 90% 90%  Weight:      Height:       Weight change:   Physical Examination: General exam: alert awake, thin frail HEENT:Oral mucosa moist, Ear/Nose WNL grossly Respiratory system: Bilaterally diminished breath sounds,no use of accessory muscle Cardiovascular  system: S1 & S2 +, No JVD. Gastrointestinal system: Abdomen soft,NT,ND, BS+ Nervous System: Alert, awake, moving all extremities,and following commands. Extremities: LE edema neg,distal peripheral  pulses palpable and warm.  Skin: No rashes,no icterus. MSK: Normal muscle bulk,tone, power   Medications reviewed:  Scheduled Meds:  aspirin EC  81 mg Oral Daily   atorvastatin  40 mg Oral QHS   budesonide  0.5 mg Nebulization BID   carvedilol  6.25 mg Oral BID WC   clopidogrel  75 mg Oral Daily   dextromethorphan-guaiFENesin  1 tablet Oral BID   enoxaparin (LOVENOX) injection  30 mg Subcutaneous Q24H   ipratropium-albuterol  3 mL Nebulization QID   lisinopril  10 mg Oral QHS   methylPREDNISolone (SOLU-MEDROL) injection  40 mg Intravenous Daily   Continuous Infusions:  ceFEPime (MAXIPIME) IV 2 g (02/19/23 1914)     Diet Order             Diet regular Room service appropriate? Yes; Fluid consistency: Thin  Diet effective now                  Intake/Output Summary (Last 24 hours) at 02/19/2023 1028 Last data filed at 02/19/2023 7829 Gross per 24 hour  Intake 1020 ml  Output 850 ml  Net 170 ml   Net IO Since Admission: 560.73 mL [02/19/23 1028]  Wt Readings from Last 3 Encounters:  02/16/23 44.5 kg  01/18/23 46 kg  10/14/22 45.4 kg     Unresulted Labs (From admission, onward)     Start     Ordered   02/19/23 0500  CBC  Daily,   R      02/18/23 0759   02/19/23 0500  Basic metabolic panel  Daily,   R      02/18/23 0759          Data Reviewed: I have personally reviewed following labs and imaging studies CBC: Recent Labs  Lab 02/16/23 1727 02/18/23 0440 02/19/23 0428  WBC 7.8 6.4 7.4  HGB 12.0 10.7* 10.3*  HCT 38.8 34.7* 34.7*  MCV 97.2 98.0 101.5*  PLT 242 243 215   Basic Metabolic Panel:  Recent Labs  Lab 02/16/23 1727 02/18/23 0440 02/19/23 0428  NA 136 138 138  K 4.5 3.9 3.5  CL 93* 96* 98  CO2 36* 38* 32  GLUCOSE 117* 125* 97  BUN 20 21 20   CREATININE 0.53 0.41* 0.74  CALCIUM 9.2 9.1 8.5*   GFR: Estimated Creatinine Clearance: 43.3 mL/min (by C-G formula based on SCr of 0.74 mg/dL). Liver Function Tests:  Recent Labs  Lab  02/16/23 1727  AST 18  ALT 16  ALKPHOS 49  BILITOT 0.6  PROT 7.0  ALBUMIN 3.9   Recent Labs  Lab 02/16/23 1727  LIPASE 34  No results for input(s): "PROCALCITON", "LATICACIDVEN" in the last 168 hours. Recent Results (from the past 240 hours)  Resp panel by RT-PCR (RSV, Flu A&B, Covid) Anterior Nasal Swab     Status: None   Collection Time: 02/16/23  5:27 PM   Specimen: Anterior Nasal Swab  Result Value Ref Range Status   SARS Coronavirus 2 by RT PCR NEGATIVE NEGATIVE Final    Comment: (NOTE) SARS-CoV-2 target nucleic acids are NOT DETECTED.  The SARS-CoV-2 RNA is generally detectable in upper respiratory specimens during the acute phase of infection. The lowest concentration of SARS-CoV-2 viral copies this assay can detect is 138 copies/mL. A negative result does not preclude SARS-Cov-2 infection and should not  be used as the sole basis for treatment or other patient management decisions. A negative result may occur with  improper specimen collection/handling, submission of specimen other than nasopharyngeal swab, presence of viral mutation(s) within the areas targeted by this assay, and inadequate number of viral copies(<138 copies/mL). A negative result must be combined with clinical observations, patient history, and epidemiological information. The expected result is Negative.  Fact Sheet for Patients:  BloggerCourse.com  Fact Sheet for Healthcare Providers:  SeriousBroker.it  This test is no t yet approved or cleared by the [place] FDA and  has been authorized for detection and/or diagnosis of SARS-CoV-2 by FDA under an Emergency Use Authorization (EUA). This EUA will remain  in effect (meaning this test can be used) for the duration of the COVID-19 declaration under Section 564(b)(1) of the Act, 21 U.S.C.section 360bbb-3(b)(1), unless the authorization is terminated  or revoked sooner.       Influenza A  by PCR NEGATIVE NEGATIVE Final   Influenza B by PCR NEGATIVE NEGATIVE Final    Comment: (NOTE) The Xpert Xpress SARS-CoV-2/FLU/RSV plus assay is intended as an aid in the diagnosis of influenza from Nasopharyngeal swab specimens and should not be used as a sole basis for treatment. Nasal washings and aspirates are unacceptable for Xpert Xpress SARS-CoV-2/FLU/RSV testing.  Fact Sheet for Patients: BloggerCourse.com  Fact Sheet for Healthcare Providers: SeriousBroker.it  This test is not yet approved or cleared by the [place] FDA and has been authorized for detection and/or diagnosis of SARS-CoV-2 by FDA under an Emergency Use Authorization (EUA). This EUA will remain in effect (meaning this test can be used) for the duration of the COVID-19 declaration under Section 564(b)(1) of the Act, 21 U.S.C. section 360bbb-3(b)(1), unless the authorization is terminated or revoked.     Resp Syncytial Virus by PCR NEGATIVE NEGATIVE Final    Comment: (NOTE) Fact Sheet for Patients: BloggerCourse.com  Fact Sheet for Healthcare Providers: SeriousBroker.it  This test is not yet approved or cleared by the [place] FDA and has been authorized for detection and/or diagnosis of SARS-CoV-2 by FDA under an Emergency Use Authorization (EUA). This EUA will remain in effect (meaning this test can be used) for the duration of the COVID-19 declaration under Section 564(b)(1) of the Act, 21 U.S.C. section 360bbb-3(b)(1), unless the authorization is terminated or revoked.  Performed at [facility], [address] [place], [place] [address]   Expectorated Sputum Assessment w Gram Stain, Rflx to Resp Cult     Status: None   Collection Time: 02/17/23  2:44 PM   Specimen: Sputum  Result Value Ref Range Status   Specimen Description SPUTUM  Final   Special Requests NONE  Final    Sputum evaluation   Final    THIS SPECIMEN IS ACCEPTABLE FOR SPUTUM CULTURE Performed at [facility], [address] [place], [place] [address]    Report Status 02/17/2023 FINAL  Final  Culture, Respiratory w Gram Stain     Status: None (Preliminary result)   Collection Time: 02/17/23  2:44 PM   Specimen: SPU  Result Value Ref Range Status   Specimen Description   Final    SPUTUM Performed at [facility], [address] [place], [place] [address]    Special Requests   Final    NONE Reflexed from [id number] Performed at [facility], [address] [place], [place] [address]    Gram Stain   Final  FEW WBC PRESENT, PREDOMINANTLY MONONUCLEAR RARE GRAM POSITIVE COCCI IN PAIRS IN SINGLES    Culture   Final    Normal respiratory flora-no Staph aureus or Pseudomonas seen Performed at [facility], [address] [place], [place] [address]    Report Status PENDING  Incomplete  MRSA Next Gen by PCR, Nasal     Status: None   Collection Time: 02/17/23  5:00 PM   Specimen: Nasal Mucosa; Nasal Swab  Result Value Ref Range Status   MRSA by PCR Next Gen NOT DETECTED NOT DETECTED Final    Comment: (NOTE) The GeneXpert MRSA Assay (FDA approved for NASAL specimens only), is one component of a comprehensive MRSA colonization surveillance program. It is not intended to diagnose MRSA infection nor to guide or monitor treatment for MRSA infections. Test performance is not FDA approved in patients less than 87 years old. Performed at [facility], [address], [place], [place] [address]     Antimicrobials/Microbiology: Anti-infectives (From admission, onward)    Start     Dose/Rate Route Frequency Ordered Stop   02/18/23 1200  vancomycin (VANCOREADY) IVPB 1250 mg/250 mL  Status:  Discontinued        1,250 mg 166.7 mL/hr over 90 Minutes Intravenous Every 48 hours 02/17/23 0818 02/17/23 1204   02/18/23  1000  vancomycin (VANCOCIN) IVPB 1000 mg/200 mL premix  Status:  Discontinued        1,000 mg 200 mL/hr over 60 Minutes Intravenous Every 36 hours 02/17/23 1204 02/18/23 1048   02/17/23 0830  ceFEPIme (MAXIPIME) 2 g in sodium chloride 0.9 % 100 mL IVPB        2 g 200 mL/hr over 30 Minutes Intravenous Every 12 hours 02/17/23 0814     02/16/23 2130  aztreonam (AZACTAM) 2 g in sodium chloride 0.9 % 100 mL IVPB        2 g 200 mL/hr over 30 Minutes Intravenous  Once 02/16/23 2122 02/16/23 2226   02/16/23 2130  vancomycin (VANCOCIN) IVPB 1000 mg/200 mL premix        1,000 mg 200 mL/hr over 60 Minutes Intravenous  Once 02/16/23 2122 02/17/23 0016         Component Value Date/Time   SDES SPUTUM 02/17/2023 1444   SDES  02/17/2023 1444    SPUTUM Performed at [facility], [address], [place], [place] [address]    SPECREQUEST NONE 02/17/2023 1444   SPECREQUEST  02/17/2023 1444    NONE Reflexed from [id number] Performed at [facility], [address] [place], [place] [address]    CULT  02/17/2023 1444    Normal respiratory flora-no Staph aureus or Pseudomonas seen Performed at [facility], [address] [place], [place] [address]    REPTSTATUS 02/17/2023 FINAL 02/17/2023 1444   REPTSTATUS PENDING 02/17/2023 1444     Radiology Studies: DG ESOPHAGUS W SINGLE CM (SOL OR THIN BA) Result Date: 02/17/2023 CLINICAL DATA:  Provided history: Reflux laryngitis. Additional history: The patient reports shortness of breath, cough, aspiration pneumonia. EXAM: ESOPHOGRAM/BARIUM SWALLOW TECHNIQUE: A single contrast examination was performed using thin barium. FLUOROSCOPY: Radiation Exposure Index (as provided by the fluoroscopic device): 26.20 mGy Kerma COMPARISON:  Chest CT 01/19/2023. FINDINGS: Limited examination. Due to patient shortness of breath, the examination was performed with the fluoroscopy table at an approximate 60 degree angle, and patient  repositioning was limited. Within these limitations, findings as follows. 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 No evidence  of a fixed stricture, mass or mucosal abnormality. Moderate intermittent esophageal dysmotility with tertiary contractions. Small hiatal hernia. No spontaneous gastroesophageal reflux was observed. The patient swallowed a 13 mm barium tablet, which freely passed into the stomach. IMPRESSION: 1. Limited examination as described. 2. 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 3. Moderate intermittent esophageal dysmotility with tertiary contractions. 4. Small hiatal hernia. 5. Within described limitations, no spontaneous gastroesophageal reflux observed. Electronically Signed   By: Jackey Loge D.O.   On: 02/17/2023 16:00     LOS: 2 days   Total time spent in review of labs and imaging, patient evaluation, formulation of plan, documentation and communication with family: 35 minutes  Lanae Boast, MD  [facility]  02/19/2023, 10:28 AM

## 2023-02-20 DIAGNOSIS — E43 Unspecified severe protein-calorie malnutrition: Secondary | ICD-10-CM | POA: Insufficient documentation

## 2023-02-20 DIAGNOSIS — J690 Pneumonitis due to inhalation of food and vomit: Secondary | ICD-10-CM | POA: Diagnosis not present

## 2023-02-20 DIAGNOSIS — J441 Chronic obstructive pulmonary disease with (acute) exacerbation: Secondary | ICD-10-CM | POA: Diagnosis not present

## 2023-02-20 DIAGNOSIS — J9621 Acute and chronic respiratory failure with hypoxia: Secondary | ICD-10-CM | POA: Diagnosis not present

## 2023-02-20 LAB — BASIC METABOLIC PANEL
Anion gap: 5 (ref 5–15)
BUN: 21 mg/dL (ref 8–23)
CO2: 35 mmol/L — ABNORMAL HIGH (ref 22–32)
Calcium: 8.6 mg/dL — ABNORMAL LOW (ref 8.9–10.3)
Chloride: 96 mmol/L — ABNORMAL LOW (ref 98–111)
Creatinine, Ser: 0.54 mg/dL (ref 0.44–1.00)
GFR, Estimated: >60 mL/min (ref >60)
Glucose, Bld: 98 mg/dL (ref 70–99)
Potassium: 3.8 mmol/L (ref 3.5–5.1)
Sodium: 136 mmol/L (ref 135–145)

## 2023-02-20 LAB — CULTURE, RESPIRATORY W GRAM STAIN: Culture: NORMAL

## 2023-02-20 LAB — CBC
HCT: 34.9 % — ABNORMAL LOW (ref 36.0–46.0)
Hemoglobin: 10.5 g/dL — ABNORMAL LOW (ref 12.0–15.0)
MCH: 30.5 pg (ref 26.0–34.0)
MCHC: 30.1 g/dL (ref 30.0–36.0)
MCV: 101.5 fL — ABNORMAL HIGH (ref 80.0–100.0)
Platelets: 208 10*3/uL (ref 150–400)
RBC: 3.44 MIL/uL — ABNORMAL LOW (ref 3.87–5.11)
RDW: 11.9 % (ref 11.5–15.5)
WBC: 6.5 10*3/uL (ref 4.0–10.5)
nRBC: 0 % (ref 0.0–0.2)

## 2023-02-20 LAB — MAGNESIUM: Magnesium: 2.2 mg/dL (ref 1.7–2.4)

## 2023-02-20 MED ORDER — POTASSIUM CHLORIDE CRYS ER 20 MEQ PO TBCR
20.0000 meq | EXTENDED_RELEASE_TABLET | Freq: Once | ORAL | Status: AC
  Administered 2023-02-20: 20 meq via ORAL
  Filled 2023-02-20: qty 1

## 2023-02-20 NOTE — TOC Transition Note (Addendum)
Transition of Care [facility]) - Discharge Note   Patient Details  Name: Kristy Weeks MRN: [id number] Date of Birth: 25-Apr-1950  Transition of [facility]) CM/SW Contact:  Amada Jupiter, LCSW Phone Number: 02/20/2023, 1:17 PM   Clinical Narrative:     Pt medically cleared for dc home today.  Due to increase O2 demands from her baseline, have contacted [employer] (supplier of O2) to note increase Liter flow and new orders.  They will adjust home O2 supply/ concentrator needs in the home.  No further TOC needs. (Pt confirms family will bring portable tank from home to use during dc transportation.)  Final next level of care: Home/Self Care Barriers to Discharge: Barriers Resolved   Patient Goals and CMS Choice Patient states their goals for this hospitalization and ongoing recovery are:: return home          Discharge Placement                       Discharge Plan and Services Additional resources added to the After Visit Summary for                  DME Arranged: N/A DME Agency: NA                  Social Drivers of Health (SDOH) Interventions SDOH Screenings   Food Insecurity: Patient Declined (02/17/2023)  Housing: Patient Declined (02/17/2023)  Transportation Needs: Patient Declined (02/17/2023)  Utilities: Patient Declined (02/17/2023)  Social Connections: Patient Declined (02/17/2023)  Tobacco Use: Medium Risk (02/16/2023)     Readmission Risk Interventions    02/19/2023    1:48 PM  Readmission Risk Prevention Plan  Post Dischage Appt Complete  Medication Screening Complete  Transportation Screening Complete

## 2023-02-20 NOTE — Progress Notes (Signed)
Mobility Specialist - Progress Note   02/20/23 0853  Mobility  Activity Ambulated with assistance in hallway  Level of Assistance Modified independent, requires aide device or extra time  Assistive Device Front wheel walker  Distance Ambulated (ft) 120 ft  Activity Response Tolerated well  Mobility Referral Yes  Mobility visit 1 Mobility  Mobility Specialist Start Time (ACUTE ONLY) 0840  Mobility Specialist Stop Time (ACUTE ONLY) 0851  Mobility Specialist Time Calculation (min) (ACUTE ONLY) 11 min   Pt received in bed and agreeable to mobility. Upon returning to room, pt desat to 84%. Encouraged pursed lip breaths bringing SpO2% to 93%. No complaints during session. Pt to bed after session with all needs met.    Pre-mobility: 93% SpO2 (4L [place]) During mobility: 90% SpO2 (4L [place]) Post-mobility: 84-93% SPO2 (4L [place])  Chief Technology Officer

## 2023-02-20 NOTE — Progress Notes (Signed)
Mobility Specialist - Progress Note   02/20/23 1308  Oxygen Therapy  SpO2 95 %  O2 Device Nasal Cannula  O2 Flow Rate (L/min) 6 L/min  Patient Activity (if Appropriate) Ambulating  Mobility  Activity Ambulated with assistance in hallway  Level of Assistance Modified independent, requires aide device or extra time  Assistive Device Front wheel walker  Distance Ambulated (ft) 120 ft  Activity Response Tolerated well  Mobility Referral Yes  Mobility visit 1 Mobility  Mobility Specialist Stop Time (ACUTE ONLY) 1309   Nurse requested Mobility Specialist to perform oxygen saturation test with pt which includes removing pt from oxygen both at rest and while ambulating.  Below are the results from that testing.     Patient Saturations on Room Air at Rest = spO2 86%  Patient Saturations on 6 Liters of oxygen while Ambulating = sp02 95%  At end of testing pt left in room on 5  Liters of oxygen.  Reported results to nurse.  Pt received in bed and agreeable to mobility. Pt ambulated on 6L d/t SpO2 tank not offering a setting of 5L. Upon returning to room, placed pt on 5L with a SpO2 reading of 96% at EOS. No complaints during session. Pt to bed after session with all needs met.    Pre-mobility: 94% SpO2 (6L [place]) During mobility: 95% SpO2 (6L [place]) Post-mobility: 96% SPO2 (5L [place])   Chief Technology Officer

## 2023-02-20 NOTE — Progress Notes (Signed)
MD notified of 6 beat run of Vtach overnight, and also pt having multiform PVCs. Pt denied CP, dizziness overnight. Cont this am w multiform PVCs but is currently asymptomatic, w/d, no pain, dizziness etc.

## 2023-02-20 NOTE — Discharge Summary (Signed)
Physician Discharge Summary  Kristy Weeks XBJ:[id number] DOB: [birth year] DOA: 02/16/2023  PCP: Bailey Mech, PA-C  Admit date: 02/16/2023 Discharge date: 02/21/2023 Recommendations for Outpatient Follow-up:  Follow up with PCP in 1 weeks-call for appointment Please obtain BMP/CBC in one week  Discharge Dispo: Home Discharge Condition: Stable Code Status:   Code Status: Full Code Diet recommendation:  Diet Order             Diet regular Room service appropriate? 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 In the ED: Vitals fairly stable afebrile CBC BMP with CO2 36 otherwise fairly stable, workup showed acute COPD exacerbation and acute on chronic hypoxia and possible aspiration pneumonia.  Pulmonary was consulted and patient was admitted Esophagogram>>Limited examination as described. 2. 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 3. Moderate intermittent esophageal dysmotility with tertiary contractions. 4. Small hiatal hernia. CXR>> Increased fluid within a large right apical cavity, concerning for superinfection. 2. Severe emphysema, with continued consolidation at the right lung base consistent with improving pneumonia or resolving atelectasis. Patient nicely improving MRSA negative, seen by PCCM and signed off advised to discharge on oral doxycycline x 7 days and a steroid  taper.      Discharge Diagnoses:  Principal Problem:   Acute on chronic respiratory failure with hypoxemia (HCC) Active Problems:   Acute on chronic hypoxic respiratory failure (HCC)   Protein-calorie malnutrition, severe  Acute on chronic hypoxic respiratory failure Acute COPD exacerbation Severe emphysema: Patient at this time has clinically improved input appreciated from PCCM currently doing well on 4 L nasal cannula even on ambulation.Sputum culture normal flora, MRSA negative antibiotic changed to cefepime alone> switched to doxycycline on discharge, do quick steroid taper as per pulmonary she will need follow-up with pulmonary she will continue bronchodilators at home.  Continue supplemental oxygen.  Doing well on 4 L at rest, on ambulation can do up to 5-6*and home oxygen arranged. She feels ready for discharge home today.  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  Continue all home antibiotics    GERD Patulous esophagus: Esophagogram with moderate esophageal dysmotility, patulous esophagus. Continue PPI.   CAD Hypertension: BP stable.  No chest pain. Cont on Aspirin Plavix Coreg and lisinopril.   Anxiety disorder: Continue low-dose Xanax prn, mood stable.   Chronic anemia: hemoglobin around 10 g-12 at baseline.Monitor while here   Severe malnutrition with low Body mass index is 16.82 kg/m. :RD consulted augment diet  Consults: PCC Subjective: Alert awake oriented feeling comfortable no new complaints  Discharge Exam: Vitals:   02/21/23 0616 02/21/23 0810  BP: 131/72   Pulse: 68   Resp: 18   Temp: 98.5 F (36.9 C)  SpO2: 94% 97%   General: Pt is alert, awake, not in acute distress Cardiovascular: RRR, S1/S2 +, no rubs, no gallops Respiratory: CTA bilaterally, no  wheezing, no rhonchi Abdominal: Soft, NT, ND, bowel sounds + Extremities: no edema, no cyanosis  Discharge Instructions  Discharge Instructions     Ambulatory Referral for Lung Cancer Scre   Complete by: As directed    Discharge instructions   Complete by: As directed    Please call call MD or return to ER for similar or worsening recurring problem that brought you to hospital or if any fever,nausea/vomiting,abdominal pain, uncontrolled pain, chest pain,  shortness of breath or any other alarming symptoms.  Please follow-up your doctor as instructed in a week time and call the office for appointment.  Please avoid alcohol, smoking, or any other illicit substance and maintain healthy habits including taking your regular medications as prescribed.  You were cared for by a hospitalist during your hospital stay. If you have any questions about your discharge medications or the care you received while you were in the hospital after you are discharged, you can call the unit and ask to speak with the hospitalist on call if the hospitalist that took care of you is not available.  Once you are discharged, your primary care physician will handle any further medical issues. Please note that NO REFILLS for any discharge medications will be authorized once you are discharged, as it is imperative that you return to your primary care physician (or establish a relationship with a primary care physician if you do not have one) for your aftercare needs so that they can reassess your need for medications and monitor your lab values   For home use only DME oxygen   Complete by: As directed    4 L at rest and upto 6 L on ambulation   Length of Need: Lifetime   Mode or (Route): Nasal cannula   Liters per Minute: 4   Frequency: Continuous (stationary and portable oxygen unit needed)   Oxygen delivery system: Gas   Increase activity slowly   Complete by: As directed       Allergies as of 02/21/2023        Reactions   Cefdinir Other (See Comments)   Abdominal pain   Hydrocodone-acetaminophen Other (See Comments)   nausea        Medication List     PAUSE taking these medications    predniSONE 5 MG tablet Wait to take this until: February 26, 2023 Commonly known as: DELTASONE Take 5-10 mg by mouth See admin instructions. Take 5 mg by mouth on daily and then take 10 mg by mouth once daily the next day. Alternating doses. You also have another medication with the same name that you may need to continue taking.       STOP taking these medications    Breo Ellipta 100-25 MCG/INH Aepb Generic drug: fluticasone furoate-vilanterol   erythromycin ophthalmic ointment   Incruse Ellipta 62.5 MCG/ACT Aepb Generic drug: umeclidinium bromide       TAKE these medications    acetaminophen 500 MG tablet Commonly known as: TYLENOL Take 1,000 mg by mouth every 6 (six) hours as needed for moderate pain (pain score 4-6) or mild pain (pain score 1-3).   albuterol (2.5 MG/3ML) 0.083% nebulizer solution Commonly known as: PROVENTIL Take 2.5 mg by nebulization every 6 (six) hours as needed for wheezing or shortness of breath. What changed: Another medication with the same name was  removed. Continue taking this medication, and follow the directions you see here.   ALPRAZolam 0.5 MG tablet Commonly known as: XANAX Take 0.25-0.5 mg by mouth at bedtime as needed for anxiety or sleep.   aspirin EC 81 MG tablet Take 81 mg by mouth daily.   atorvastatin 40 MG tablet Commonly known as: LIPITOR Take 40 mg by mouth at bedtime.   azithromycin 500 MG tablet Commonly known as: ZITHROMAX Take 1 tablet by mouth on 09/11/2022, then take one tablet by mouth once daily on Monday, Wednesday, Friday.   budesonide 0.5 MG/2ML nebulizer solution Commonly known as: PULMICORT USE 1 VIAL VIA NEBULIZER EVERY 12 HOURS   carvedilol 6.25 MG tablet Commonly known as: COREG Take 6.25 mg by mouth 2 (two) times  daily with a meal.   clopidogrel 75 MG tablet Commonly known as: PLAVIX Take 75 mg by mouth daily.   doxycycline 100 MG tablet Commonly known as: VIBRA-TABS Take 1 tablet (100 mg total) by mouth 2 (two) times daily for 7 days.   folic acid 1 MG tablet Commonly known as: FOLVITE Take 1 mg by mouth daily.   formoterol 20 MCG/2ML nebulizer solution Commonly known as: PERFOROMIST USE 1 VIAL VIA NEBULIZER EVERY 12 HOURS   ipratropium-albuterol 0.5-2.5 (3) MG/3ML Soln Commonly known as: DUONEB Take 3 mLs by nebulization every 6 (six) hours as needed (for shortness of breath).   lisinopril 10 MG tablet Commonly known as: ZESTRIL Take 10 mg by mouth at bedtime.   predniSONE 10 MG tablet Commonly known as: DELTASONE Take PO 4 tabs daily x 2 days,3 tabs daily x 2 days,2 tabs daily x 2 days What changed: Another medication with the same name was paused. Ask your nurse or doctor if you should take this medication.               Durable Medical Equipment  (From admission, onward)           Start     Ordered   02/21/23 0000  For home use only DME oxygen       Comments: 4 L at rest and upto 6 L on ambulation  Question Answer Comment  Length of Need Lifetime   Mode or (Route) Nasal cannula   Liters per Minute 4   Frequency Continuous (stationary and portable oxygen unit needed)   Oxygen delivery system Gas      02/21/23 1129            Follow-up Information     Podraza, Rudy Jew, PA-C Follow up in 1 week(s).   Specialty: Physician Assistant Contact information: [phone] [place] [address] [phone]                Allergies  Allergen Reactions   Cefdinir Other (See Comments)    Abdominal pain   Hydrocodone-Acetaminophen Other (See Comments)    nausea    The results of significant diagnostics from this hospitalization (including imaging, microbiology, ancillary and laboratory) are listed below for reference.     Microbiology: Recent Results (from the past 240 hours)  Resp panel by RT-PCR (RSV, Flu A&B, Covid) Anterior Nasal Swab     Status: None   Collection Time: 02/16/23  5:27 PM   Specimen: Anterior Nasal Swab  Result Value Ref Range Status   SARS Coronavirus 2 by RT PCR NEGATIVE NEGATIVE Final    Comment: (NOTE) SARS-CoV-2 target nucleic acids are NOT DETECTED.  The SARS-CoV-2 RNA is generally detectable in  upper respiratory specimens during the acute phase of infection. The lowest concentration of SARS-CoV-2 viral copies this assay can detect is 138 copies/mL. A negative result does not preclude SARS-Cov-2 infection and should not be used as the sole basis for treatment or other patient management decisions. A negative result may occur with  improper specimen collection/handling, submission of specimen other than nasopharyngeal swab, presence of viral mutation(s) within the areas targeted by this assay, and inadequate number of viral copies(<138 copies/mL). A negative result must be combined with clinical observations, patient history, and epidemiological information. The expected result is Negative.  Fact Sheet for Patients:  BloggerCourse.com  Fact Sheet for Healthcare Providers:  SeriousBroker.it  This test is no t yet approved or cleared by the [place] FDA and  has been authorized for detection and/or diagnosis of SARS-CoV-2 by FDA under an Emergency Use Authorization (EUA). This EUA will remain  in effect (meaning this test can be used) for the duration of the COVID-19 declaration under Section 564(b)(1) of the Act, 21 U.S.C.section 360bbb-3(b)(1), unless the authorization is terminated  or revoked sooner.       Influenza A by PCR NEGATIVE NEGATIVE Final   Influenza B by PCR NEGATIVE NEGATIVE Final    Comment: (NOTE) The Xpert Xpress SARS-CoV-2/FLU/RSV plus assay is intended as an aid in the diagnosis of influenza  from Nasopharyngeal swab specimens and should not be used as a sole basis for treatment. Nasal washings and aspirates are unacceptable for Xpert Xpress SARS-CoV-2/FLU/RSV testing.  Fact Sheet for Patients: BloggerCourse.com  Fact Sheet for Healthcare Providers: SeriousBroker.it  This test is not yet approved or cleared by the [place] FDA and has been authorized for detection and/or diagnosis of SARS-CoV-2 by FDA under an Emergency Use Authorization (EUA). This EUA will remain in effect (meaning this test can be used) for the duration of the COVID-19 declaration under Section 564(b)(1) of the Act, 21 U.S.C. section 360bbb-3(b)(1), unless the authorization is terminated or revoked.     Resp Syncytial Virus by PCR NEGATIVE NEGATIVE Final    Comment: (NOTE) Fact Sheet for Patients: BloggerCourse.com  Fact Sheet for Healthcare Providers: SeriousBroker.it  This test is not yet approved or cleared by the [place] FDA and has been authorized for detection and/or diagnosis of SARS-CoV-2 by FDA under an Emergency Use Authorization (EUA). This EUA will remain in effect (meaning this test can be used) for the duration of the COVID-19 declaration under Section 564(b)(1) of the Act, 21 U.S.C. section 360bbb-3(b)(1), unless the authorization is terminated or revoked.  Performed at [facility], [address] [place], [place] [address]   Expectorated Sputum Assessment w Gram Stain, Rflx to Resp Cult     Status: None   Collection Time: 02/17/23  2:44 PM   Specimen: Sputum  Result Value Ref Range Status   Specimen Description SPUTUM  Final   Special Requests NONE  Final   Sputum evaluation   Final    THIS SPECIMEN IS ACCEPTABLE FOR SPUTUM CULTURE Performed at [facility], [address], [place], [place] [address]    Report Status 02/17/2023  FINAL  Final  Culture, Respiratory w Gram Stain     Status: None   Collection Time: 02/17/23  2:44 PM   Specimen: SPU  Result Value Ref Range Status   Specimen Description   Final    SPUTUM Performed at [facility], [address], [place], [place] [address]    Special Requests  Final    NONE Reflexed from [id number] Performed at [facility], [address] [place], [place] [address]    Gram Stain   Final    FEW WBC PRESENT, PREDOMINANTLY MONONUCLEAR RARE GRAM POSITIVE COCCI IN PAIRS IN SINGLES    Culture   Final    Normal respiratory flora-no Staph aureus or Pseudomonas seen Performed at [facility], [address], [place], [place] [address]    Report Status 02/20/2023 FINAL  Final  MRSA Next Gen by PCR, Nasal     Status: None   Collection Time: 02/17/23  5:00 PM   Specimen: Nasal Mucosa; Nasal Swab  Result Value Ref Range Status   MRSA by PCR Next Gen NOT DETECTED NOT DETECTED Final    Comment: (NOTE) The GeneXpert MRSA Assay (FDA approved for NASAL specimens only), is one component of a comprehensive MRSA colonization surveillance program. It is not intended to diagnose MRSA infection nor to guide or monitor treatment for MRSA infections. Test performance is not FDA approved in patients less than 41 years old. Performed at [facility], [address] [place], [place] [address]     Procedures/Studies: DG ESOPHAGUS W SINGLE CM (SOL OR THIN BA) Result Date: 02/17/2023 CLINICAL DATA:  Provided history: Reflux laryngitis. Additional history: The patient reports shortness of breath, cough, aspiration pneumonia. EXAM: ESOPHOGRAM/BARIUM SWALLOW TECHNIQUE: A single contrast examination was performed using thin barium. FLUOROSCOPY: Radiation Exposure Index (as provided by the fluoroscopic device): 26.20 mGy Kerma COMPARISON:  Chest CT 01/19/2023. FINDINGS: Limited examination. Due to patient shortness of breath, the  examination was performed with the fluoroscopy table at an approximate 60 degree angle, and patient repositioning was limited. Within these limitations, findings as follows. 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 No evidence of a fixed stricture, mass or mucosal abnormality. Moderate intermittent esophageal dysmotility with tertiary contractions. Small hiatal hernia. No spontaneous gastroesophageal reflux was observed. The patient swallowed a 13 mm barium tablet, which freely passed into the stomach. IMPRESSION: 1. Limited examination as described. 2. 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 3. Moderate intermittent esophageal dysmotility with tertiary contractions. 4. Small hiatal hernia. 5. Within described limitations, no spontaneous gastroesophageal reflux observed. Electronically Signed   By: Jackey Loge D.O.   On: 02/17/2023 16:00   DG Chest 2 View Result Date: 02/16/2023 CLINICAL DATA:  Cough, short of breath EXAM: CHEST - 2 VIEW COMPARISON:  01/19/2023, 01/18/2023 FINDINGS: Single frontal view of the chest demonstrates a stable cardiac silhouette. Stable ectasia and atherosclerosis of the thoracic aorta. Increased fluid within the right apical cavity seen previously. Severe bullous emphysema. Continued consolidation at the medial right lung base less pronounced since prior x-ray and CT. No evidence of pneumothorax. No acute bony abnormalities. IMPRESSION: 1. Increased fluid within a large right apical cavity, concerning for superinfection. 2. Severe emphysema, with continued consolidation at the right lung base consistent with improving pneumonia or resolving atelectasis. Electronically Signed   By: Sharlet Salina M.D.   On: 02/16/2023 18:06    Labs: BNP (last 3 results) No results for input(s): "BNP" in the last 8760 hours. Basic Metabolic Panel: Recent Labs  Lab 02/16/23 1727 02/18/23 0440 02/19/23 0428 02/20/23 0429 02/20/23 1041 02/21/23 0519  NA 136 138 138 136  --  141  K 4.5  3.9 3.5 3.8  --  4.3  CL 93* 96* 98 96*  --  95*  CO2 36* 38* 32 35*  --  41*  GLUCOSE 117* 125* 97 98  --  84  BUN 20 21 20 21   --  21  CREATININE 0.53 0.41* 0.74 0.54  --  0.61  CALCIUM 9.2 9.1 8.5* 8.6*  --  9.0  MG  --   --   --   --  2.2  --    Liver Function Tests: Recent Labs  Lab 02/16/23 1727  AST 18  ALT 16  ALKPHOS 49  BILITOT 0.6  PROT 7.0  ALBUMIN 3.9   Recent Labs  Lab 02/16/23 1727  LIPASE 34   No results for input(s): "AMMONIA" in the last 168 hours. CBC: Recent Labs  Lab 02/16/23 1727 02/18/23 0440 02/19/23 0428 02/20/23 0429 02/21/23 0519  WBC 7.8 6.4 7.4 6.5 6.2  HGB 12.0 10.7* 10.3* 10.5* 10.9*  HCT 38.8 34.7* 34.7* 34.9* 36.9  MCV 97.2 98.0 101.5* 101.5* 101.9*  PLT 242 243 215 208 224   Cardiac Enzymes: No results for input(s): "CKTOTAL", "CKMB", "CKMBINDEX", "TROPONINI" in the last 168 hours. BNP: Invalid input(s): "POCBNP" CBG: No results for input(s): "GLUCAP" in the last 168 hours. D-Dimer No results for input(s): "DDIMER" in the last 72 hours. Hgb A1c No results for input(s): "HGBA1C" in the last 72 hours. Lipid Profile No results for input(s): "CHOL", "HDL", "LDLCALC", "TRIG", "CHOLHDL", "LDLDIRECT" in the last 72 hours. Thyroid function studies No results for input(s): "TSH", "T4TOTAL", "T3FREE", "THYROIDAB" in the last 72 hours.  Invalid input(s): "FREET3" Anemia work up No results for input(s): "VITAMINB12", "FOLATE", "FERRITIN", "TIBC", "IRON", "RETICCTPCT" in the last 72 hours. Urinalysis    Component Value Date/Time   COLORURINE YELLOW 02/16/2023 2307   APPEARANCEUR CLEAR 02/16/2023 2307   LABSPEC 1.020 02/16/2023 2307   PHURINE 6.0 02/16/2023 2307   GLUCOSEU NEGATIVE 02/16/2023 2307   HGBUR NEGATIVE 02/16/2023 2307   BILIRUBINUR NEGATIVE 02/16/2023 2307   KETONESUR 15 (A) 02/16/2023 2307   PROTEINUR NEGATIVE 02/16/2023 2307   NITRITE NEGATIVE 02/16/2023 2307   LEUKOCYTESUR NEGATIVE 02/16/2023 2307   Sepsis  Labs Recent Labs  Lab 02/18/23 0440 02/19/23 0428 02/20/23 0429 02/21/23 0519  WBC 6.4 7.4 6.5 6.2   Microbiology Recent Results (from the past 240 hours)  Resp panel by RT-PCR (RSV, Flu A&B, Covid) Anterior Nasal Swab     Status: None   Collection Time: 02/16/23  5:27 PM   Specimen: Anterior Nasal Swab  Result Value Ref Range Status   SARS Coronavirus 2 by RT PCR NEGATIVE NEGATIVE Final    Comment: (NOTE) SARS-CoV-2 target nucleic acids are NOT DETECTED.  The SARS-CoV-2 RNA is generally detectable in upper respiratory specimens during the acute phase of infection. The lowest concentration of SARS-CoV-2 viral copies this assay can detect is 138 copies/mL. A negative result does not preclude SARS-Cov-2 infection and should not be used as the sole basis for treatment or other patient management decisions. A negative result may occur with  improper specimen collection/handling, submission of specimen other than nasopharyngeal swab, presence of viral mutation(s) within the areas targeted by this assay, and inadequate number of viral copies(<138 copies/mL). A negative result must be combined with clinical observations, patient history, and epidemiological information. The expected result is Negative.  Fact Sheet for Patients:  BloggerCourse.com  Fact Sheet for Healthcare Providers:  SeriousBroker.it  This test is no t yet approved or cleared by the [place] FDA and  has been authorized for detection and/or diagnosis of SARS-CoV-2 by FDA under an Emergency Use Authorization (EUA). This EUA will remain  in effect (meaning this test can be used)  for the duration of the COVID-19 declaration under Section 564(b)(1) of the Act, 21 U.S.C.section 360bbb-3(b)(1), unless the authorization is terminated  or revoked sooner.       Influenza A by PCR NEGATIVE NEGATIVE Final   Influenza B by PCR NEGATIVE NEGATIVE Final    Comment:  (NOTE) The Xpert Xpress SARS-CoV-2/FLU/RSV plus assay is intended as an aid in the diagnosis of influenza from Nasopharyngeal swab specimens and should not be used as a sole basis for treatment. Nasal washings and aspirates are unacceptable for Xpert Xpress SARS-CoV-2/FLU/RSV testing.  Fact Sheet for Patients: BloggerCourse.com  Fact Sheet for Healthcare Providers: SeriousBroker.it  This test is not yet approved or cleared by the [place] FDA and has been authorized for detection and/or diagnosis of SARS-CoV-2 by FDA under an Emergency Use Authorization (EUA). This EUA will remain in effect (meaning this test can be used) for the duration of the COVID-19 declaration under Section 564(b)(1) of the Act, 21 U.S.C. section 360bbb-3(b)(1), unless the authorization is terminated or revoked.     Resp Syncytial Virus by PCR NEGATIVE NEGATIVE Final    Comment: (NOTE) Fact Sheet for Patients: BloggerCourse.com  Fact Sheet for Healthcare Providers: SeriousBroker.it  This test is not yet approved or cleared by the [place] FDA and has been authorized for detection and/or diagnosis of SARS-CoV-2 by FDA under an Emergency Use Authorization (EUA). This EUA will remain in effect (meaning this test can be used) for the duration of the COVID-19 declaration under Section 564(b)(1) of the Act, 21 U.S.C. section 360bbb-3(b)(1), unless the authorization is terminated or revoked.  Performed at [facility], [address] [place], [place] [address]   Expectorated Sputum Assessment w Gram Stain, Rflx to Resp Cult     Status: None   Collection Time: 02/17/23  2:44 PM   Specimen: Sputum  Result Value Ref Range Status   Specimen Description SPUTUM  Final   Special Requests NONE  Final   Sputum evaluation   Final    THIS SPECIMEN IS ACCEPTABLE FOR SPUTUM CULTURE Performed at  [facility], [address], [place], [place] [address]    Report Status 02/17/2023 FINAL  Final  Culture, Respiratory w Gram Stain     Status: None   Collection Time: 02/17/23  2:44 PM   Specimen: SPU  Result Value Ref Range Status   Specimen Description   Final    SPUTUM Performed at [facility], [address] [place], [place] [address]    Special Requests   Final    NONE Reflexed from [id number] Performed at [facility], [address], [place], [place] [address]    Gram Stain   Final    FEW WBC PRESENT, PREDOMINANTLY MONONUCLEAR RARE GRAM POSITIVE COCCI IN PAIRS IN SINGLES    Culture   Final    Normal respiratory flora-no Staph aureus or Pseudomonas seen Performed at [facility], [address], [place], [place] [address]    Report Status 02/20/2023 FINAL  Final  MRSA Next Gen by PCR, Nasal     Status: None   Collection Time: 02/17/23  5:00 PM   Specimen: Nasal Mucosa; Nasal Swab  Result Value Ref Range Status   MRSA by PCR Next Gen NOT DETECTED NOT DETECTED Final    Comment: (NOTE) The GeneXpert MRSA Assay (FDA approved for NASAL specimens only), is one component of a comprehensive MRSA colonization surveillance program. It is not intended to  diagnose MRSA infection nor to guide or monitor treatment for MRSA infections. Test performance is not FDA approved in patients less than 49 years old. Performed at [facility], [address], [place], [place] [address]    Time coordinating discharge: 35 minutes  SIGNED: Lanae Boast, MD  [facility] 02/21/2023, 11:30 AM  If 7PM-7AM, please contact night-coverage [email or web address]

## 2023-02-20 NOTE — Progress Notes (Signed)
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 In the ED: Vitals fairly stable afebrile CBC BMP with CO2 36 otherwise fairly stable, workup showed acute COPD exacerbation and acute on chronic hypoxia and possible aspiration pneumonia.  Pulmonary was consulted and patient was admitted Esophagogram>>Limited examination as described. 2. 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 3. Moderate intermittent esophageal dysmotility with tertiary contractions. 4. Small hiatal hernia. CXR>> Increased fluid within a large right apical cavity, concerning for superinfection. 2. Severe emphysema, with continued consolidation at the right lung base consistent with improving pneumonia or resolving atelectasis. Patient nicely improving MRSA negative, seen by PCCM and signed off advised to discharge on oral doxycycline x 7 days and a steroid taper.       Subjective: Slowly improving Needed 6l on ambulation and at times on rest Some cough  Assessment and Plan: Principal Problem:   Acute on chronic respiratory failure with hypoxemia (HCC) Active Problems:   Acute on chronic hypoxic respiratory failure (HCC)   Protein-calorie malnutrition, severe   Acute on chronic hypoxic respiratory failure Acute COPD exacerbation Severe emphysema: Slow to improve.  But feels  getting better every day.  Needing 6 L [place].  Sputum culture normal flora, MRSA negative antibiotic changed to cefepime alone, off vancomycin 1/22. Continue Solu-Medrol 40 mg daily one more day and cont DuoNeb 4 times daily, Pulmicort neb Appreciate pulmonary input. Cont to wean o2 to 4l at rest.  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  GERD Patulous esophagus: Esophagogram with moderate esophageal dysmotility, patulous esophagus. On ppi  CAD Hypertension: BP stable no chest pain. Cont her home Aspirin Plavix Coreg and lisinopril.  Anxiety disorder: Continue low-dose Xanax prn, mood stable.  Chronic anemia: hemoglobin around 10 g-12 at baseline.Monitor while here  Severe malnutrition with low Body mass index is 16.82 kg/m. :RD consulted augment diet Nutrition Status: Nutrition Problem: Severe Malnutrition Etiology: chronic illness (severe COPD) Signs/Symptoms: severe fat depletion, severe muscle depletion Interventions: Refer to RD note for recommendations, Boost Plus, MVI    DVT prophylaxis: enoxaparin (LOVENOX) injection 30 mg Start: 02/18/23 1200 SCDs Start: 02/17/23 1102 Code Status:   Code Status: Full Code Family Communication: plan of care discussed with patient/no family at bedside. Patient status is: Remains hospitalized because of severity of illness Level of care: Med-Surg   Dispo: The patient is from: Home with her son            Anticipated disposition: likely home tomorrow Objective: Vitals last 24 hrs: Vitals:   02/20/23 1000 02/20/23 1218 02/20/23 1308 02/20/23 1437  BP:    132/78  Pulse:    79  Resp:    18  Temp:    98.6 F (37 C)  TempSrc:    Oral  SpO2: 93% 92%  95% 96%  Weight:      Height:       Weight change:   Physical Examination: General exam: alert  awake, oriented at baseline, older than stated age HEENT:Oral mucosa moist, Ear/Nose WNL grossly Respiratory system: Bilaterally diminished BS,no use of accessory muscle Cardiovascular system: S1 & S2 +, No JVD. Gastrointestinal system: Abdomen soft,NT,ND, BS+ Nervous System: Alert, awake, moving all extremities,and following commands. Extremities: LE edema neg,distal peripheral pulses palpable and warm.  Skin: No rashes,no icterus. MSK: Normal muscle bulk,tone, power   Medications reviewed:  Scheduled Meds:  aspirin EC  81 mg Oral Daily   atorvastatin  40 mg Oral QHS   budesonide  0.5 mg Nebulization BID   carvedilol  6.25 mg Oral BID WC   clopidogrel  75 mg Oral Daily   dextromethorphan-guaiFENesin  1 tablet Oral BID   enoxaparin (LOVENOX) injection  30 mg Subcutaneous Q24H   ipratropium-albuterol  3 mL Nebulization QID   lactose free nutrition  237 mL Oral BID BM   lisinopril  10 mg Oral QHS   methylPREDNISolone (SOLU-MEDROL) injection  40 mg Intravenous Daily   multivitamin with minerals  1 tablet Oral Daily   potassium chloride  20 mEq Oral Once   Continuous Infusions:  ceFEPime (MAXIPIME) IV 2 g (02/20/23 0940)     Diet Order             Diet regular Room service appropriate? Yes; Fluid consistency: Thin  Diet effective now                  Intake/Output Summary (Last 24 hours) at 02/20/2023 1510 Last data filed at 02/20/2023 1400 Gross per 24 hour  Intake 2220 ml  Output 2750 ml  Net -530 ml   Net IO Since Admission: 270.73 mL [02/20/23 1510]  Wt Readings from Last 3 Encounters:  02/16/23 44.5 kg  01/18/23 46 kg  10/14/22 45.4 kg     Unresulted Labs (From admission, onward)     Start     Ordered   02/19/23 0500  CBC  Daily,   R      02/18/23 0759   02/19/23 0500  Basic metabolic panel  Daily,   R      02/18/23 0759          Data Reviewed: I have personally reviewed following labs and imaging studies CBC: Recent Labs  Lab 02/16/23 1727  02/18/23 0440 02/19/23 0428 02/20/23 0429  WBC 7.8 6.4 7.4 6.5  HGB 12.0 10.7* 10.3* 10.5*  HCT 38.8 34.7* 34.7* 34.9*  MCV 97.2 98.0 101.5* 101.5*  PLT 242 243 215 208   Basic Metabolic Panel:  Recent Labs  Lab 02/16/23 1727 02/18/23 0440 02/19/23 0428 02/20/23 0429 02/20/23 1041  NA 136 138 138 136  --   K 4.5 3.9 3.5 3.8  --   CL 93* 96* 98 96*  --   CO2 36* 38* 32 35*  --   GLUCOSE 117* 125* 97 98  --   BUN 20 21 20 21   --   CREATININE 0.53 0.41* 0.74 0.54  --   CALCIUM 9.2 9.1 8.5* 8.6*  --   MG  --   --   --   --  2.2   GFR: Estimated Creatinine Clearance: 43.3 mL/min (by C-G formula based on SCr of 0.54 mg/dL). Liver Function Tests:  Recent Labs  Lab 02/16/23 1727  AST 18  ALT 16  ALKPHOS 49  BILITOT 0.6  PROT  7.0  ALBUMIN 3.9   Recent Labs  Lab 02/16/23 1727  LIPASE 34  No results for input(s): "PROCALCITON", "LATICACIDVEN" in the last 168 hours. Recent Results (from the past 240 hours)  Resp panel by RT-PCR (RSV, Flu A&B, Covid) Anterior Nasal Swab     Status: None   Collection Time: 02/16/23  5:27 PM   Specimen: Anterior Nasal Swab  Result Value Ref Range Status   SARS Coronavirus 2 by RT PCR NEGATIVE NEGATIVE Final    Comment: (NOTE) SARS-CoV-2 target nucleic acids are NOT DETECTED.  The SARS-CoV-2 RNA is generally detectable in upper respiratory specimens during the acute phase of infection. The lowest concentration of SARS-CoV-2 viral copies this assay can detect is 138 copies/mL. A negative result does not preclude SARS-Cov-2 infection and should not be used as the sole basis for treatment or other patient management decisions. A negative result may occur with  improper specimen collection/handling, submission of specimen other than nasopharyngeal swab, presence of viral mutation(s) within the areas targeted by this assay, and inadequate number of viral copies(<138 copies/mL). A negative result must be combined with clinical observations,  patient history, and epidemiological information. The expected result is Negative.  Fact Sheet for Patients:  BloggerCourse.com  Fact Sheet for Healthcare Providers:  SeriousBroker.it  This test is no t yet approved or cleared by the [place] FDA and  has been authorized for detection and/or diagnosis of SARS-CoV-2 by FDA under an Emergency Use Authorization (EUA). This EUA will remain  in effect (meaning this test can be used) for the duration of the COVID-19 declaration under Section 564(b)(1) of the Act, 21 U.S.C.section 360bbb-3(b)(1), unless the authorization is terminated  or revoked sooner.       Influenza A by PCR NEGATIVE NEGATIVE Final   Influenza B by PCR NEGATIVE NEGATIVE Final    Comment: (NOTE) The Xpert Xpress SARS-CoV-2/FLU/RSV plus assay is intended as an aid in the diagnosis of influenza from Nasopharyngeal swab specimens and should not be used as a sole basis for treatment. Nasal washings and aspirates are unacceptable for Xpert Xpress SARS-CoV-2/FLU/RSV testing.  Fact Sheet for Patients: BloggerCourse.com  Fact Sheet for Healthcare Providers: SeriousBroker.it  This test is not yet approved or cleared by the [place] FDA and has been authorized for detection and/or diagnosis of SARS-CoV-2 by FDA under an Emergency Use Authorization (EUA). This EUA will remain in effect (meaning this test can be used) for the duration of the COVID-19 declaration under Section 564(b)(1) of the Act, 21 U.S.C. section 360bbb-3(b)(1), unless the authorization is terminated or revoked.     Resp Syncytial Virus by PCR NEGATIVE NEGATIVE Final    Comment: (NOTE) Fact Sheet for Patients: BloggerCourse.com  Fact Sheet for Healthcare Providers: SeriousBroker.it  This test is not yet approved or cleared by the [place] FDA and has been authorized for detection and/or diagnosis of SARS-CoV-2 by FDA under an Emergency Use Authorization (EUA). This EUA will remain in effect (meaning this test can be used) for the duration of the COVID-19 declaration under Section 564(b)(1) of the Act, 21 U.S.C. section 360bbb-3(b)(1), unless the authorization is terminated or revoked.  Performed at [facility], [address] [place], [place] [address]   Expectorated Sputum Assessment w Gram Stain, Rflx to Resp Cult     Status: None   Collection Time: 02/17/23  2:44 PM   Specimen: Sputum  Result Value Ref Range Status   Specimen Description SPUTUM  Final  Special Requests NONE  Final   Sputum evaluation   Final    THIS SPECIMEN IS ACCEPTABLE FOR SPUTUM CULTURE Performed at [facility], [address] [place], [place] [address]    Report Status 02/17/2023 FINAL  Final  Culture, Respiratory w Gram Stain     Status: None   Collection Time: 02/17/23  2:44 PM   Specimen: SPU  Result Value Ref Range Status   Specimen Description   Final    SPUTUM Performed at [facility], [address], [place], [place] [address]    Special Requests   Final    NONE Reflexed from [id number] Performed at [facility], [address], [place], [place] [address]    Gram Stain   Final    FEW WBC PRESENT, PREDOMINANTLY MONONUCLEAR RARE GRAM POSITIVE COCCI IN PAIRS IN SINGLES    Culture   Final    Normal respiratory flora-no Staph aureus or Pseudomonas seen Performed at [facility], [address], [place], [place] [address]    Report Status 02/20/2023 FINAL  Final  MRSA Next Gen by PCR, Nasal     Status: None   Collection Time: 02/17/23  5:00 PM   Specimen: Nasal Mucosa; Nasal Swab  Result Value Ref Range Status   MRSA by PCR Next Gen NOT DETECTED NOT DETECTED Final    Comment: (NOTE) The GeneXpert MRSA Assay (FDA approved for NASAL specimens  only), is one component of a comprehensive MRSA colonization surveillance program. It is not intended to diagnose MRSA infection nor to guide or monitor treatment for MRSA infections. Test performance is not FDA approved in patients less than 59 years old. Performed at [facility], [address] [place], [place] [address]     Antimicrobials/Microbiology: Anti-infectives (From admission, onward)    Start     Dose/Rate Route Frequency Ordered Stop   02/18/23 1200  vancomycin (VANCOREADY) IVPB 1250 mg/250 mL  Status:  Discontinued        1,250 mg 166.7 mL/hr over 90 Minutes Intravenous Every 48 hours 02/17/23 0818 02/17/23 1204   02/18/23 1000  vancomycin (VANCOCIN) IVPB 1000 mg/200 mL premix  Status:  Discontinued        1,000 mg 200 mL/hr over 60 Minutes Intravenous Every 36 hours 02/17/23 1204 02/18/23 1048   02/17/23 0830  ceFEPIme (MAXIPIME) 2 g in sodium chloride 0.9 % 100 mL IVPB        2 g 200 mL/hr over 30 Minutes Intravenous Every 12 hours 02/17/23 0814     02/16/23 2130  aztreonam (AZACTAM) 2 g in sodium chloride 0.9 % 100 mL IVPB        2 g 200 mL/hr over 30 Minutes Intravenous  Once 02/16/23 2122 02/16/23 2226   02/16/23 2130  vancomycin (VANCOCIN) IVPB 1000 mg/200 mL premix        1,000 mg 200 mL/hr over 60 Minutes Intravenous  Once 02/16/23 2122 02/17/23 0016         Component Value Date/Time   SDES SPUTUM 02/17/2023 1444   SDES  02/17/2023 1444    SPUTUM Performed at [facility], [address], [place], [place] [address]    SPECREQUEST NONE 02/17/2023 1444   SPECREQUEST  02/17/2023 1444    NONE Reflexed from [id number] Performed at [facility], [address] [place], [place] [address]    CULT  02/17/2023 1444    Normal respiratory flora-no Staph aureus  or Pseudomonas seen Performed at [facility], [address] [place], [place] [address]    REPTSTATUS 02/17/2023 FINAL 02/17/2023 1444    REPTSTATUS 02/20/2023 FINAL 02/17/2023 1444     Radiology Studies: No results found.    LOS: 3 days   Total time spent in review of labs and imaging, patient evaluation, formulation of plan, documentation and communication with family: 35 minutes  Lanae Boast, MD  [facility]  02/20/2023, 3:10 PM

## 2023-02-21 DIAGNOSIS — J9621 Acute and chronic respiratory failure with hypoxia: Secondary | ICD-10-CM | POA: Diagnosis not present

## 2023-02-21 DIAGNOSIS — J690 Pneumonitis due to inhalation of food and vomit: Secondary | ICD-10-CM | POA: Diagnosis not present

## 2023-02-21 DIAGNOSIS — J441 Chronic obstructive pulmonary disease with (acute) exacerbation: Secondary | ICD-10-CM | POA: Diagnosis not present

## 2023-02-21 LAB — CBC
HCT: 36.9 % (ref 36.0–46.0)
Hemoglobin: 10.9 g/dL — ABNORMAL LOW (ref 12.0–15.0)
MCH: 30.1 pg (ref 26.0–34.0)
MCHC: 29.5 g/dL — ABNORMAL LOW (ref 30.0–36.0)
MCV: 101.9 fL — ABNORMAL HIGH (ref 80.0–100.0)
Platelets: 224 10*3/uL (ref 150–400)
RBC: 3.62 MIL/uL — ABNORMAL LOW (ref 3.87–5.11)
RDW: 11.9 % (ref 11.5–15.5)
WBC: 6.2 10*3/uL (ref 4.0–10.5)
nRBC: 0 % (ref 0.0–0.2)

## 2023-02-21 LAB — BASIC METABOLIC PANEL
Anion gap: 5 (ref 5–15)
BUN: 21 mg/dL (ref 8–23)
CO2: 41 mmol/L — ABNORMAL HIGH (ref 22–32)
Calcium: 9 mg/dL (ref 8.9–10.3)
Chloride: 95 mmol/L — ABNORMAL LOW (ref 98–111)
Creatinine, Ser: 0.61 mg/dL (ref 0.44–1.00)
GFR, Estimated: >60 mL/min (ref >60)
Glucose, Bld: 84 mg/dL (ref 70–99)
Potassium: 4.3 mmol/L (ref 3.5–5.1)
Sodium: 141 mmol/L (ref 135–145)

## 2023-02-21 MED ORDER — PREDNISONE 10 MG PO TABS: ORAL_TABLET | ORAL | 0 refills | Status: DC

## 2023-02-21 MED ORDER — DOXYCYCLINE HYCLATE 100 MG PO TABS: 100.0000 mg | ORAL_TABLET | Freq: Two times a day (BID) | ORAL | 0 refills | Status: AC

## 2023-02-21 NOTE — Progress Notes (Signed)
At rest on 4L oxygen per [place] sats are 93-94%.

## 2023-02-21 NOTE — Progress Notes (Signed)
AVS reviewed w/ patient who verbalized an understanding. PIV removed as noted. [facility] notified of pt discharge- pt removed from tele. Awaiting on family to arrive w/ travel tank.

## 2023-02-21 NOTE — Plan of Care (Signed)
  Problem: Education: Goal: Knowledge of General Education information will improve Description: Including pain rating scale, medication(s)/side effects and non-pharmacologic comfort measures Outcome: Adequate for Discharge   Problem: Health Behavior/Discharge Planning: Goal: Ability to manage health-related needs will improve Outcome: Adequate for Discharge   Problem: Clinical Measurements: Goal: Ability to maintain clinical measurements within normal limits will improve Outcome: Adequate for Discharge Goal: Will remain free from infection Outcome: Adequate for Discharge Goal: Diagnostic test results will improve Outcome: Adequate for Discharge Goal: Respiratory complications will improve Outcome: Adequate for Discharge Goal: Cardiovascular complication will be avoided Outcome: Adequate for Discharge   Problem: Activity: Goal: Risk for activity intolerance will decrease Outcome: Adequate for Discharge   Problem: Nutrition: Goal: Adequate nutrition will be maintained Outcome: Adequate for Discharge   Problem: Coping: Goal: Level of anxiety will decrease Outcome: Adequate for Discharge   Problem: Elimination: Goal: Will not experience complications related to bowel motility Outcome: Adequate for Discharge Goal: Will not experience complications related to urinary retention Outcome: Adequate for Discharge   Problem: Pain Managment: Goal: General experience of comfort will improve and/or be controlled Outcome: Adequate for Discharge   Problem: Safety: Goal: Ability to remain free from injury will improve Outcome: Adequate for Discharge   Problem: Skin Integrity: Goal: Risk for impaired skin integrity will decrease Outcome: Adequate for Discharge   Problem: Malnutrition  (NI-5.2) Goal: Food and/or nutrient delivery Description: Individualized approach for food/nutrient provision. Outcome: Adequate for Discharge

## 2023-02-21 NOTE — Progress Notes (Signed)
Ready for dc home. son brought personal O2 tank. Discharged via wc to front entrance accompanied by son and NT.

## 2023-07-07 ENCOUNTER — Other Ambulatory Visit: Payer: Self-pay

## 2023-07-07 ENCOUNTER — Encounter (HOSPITAL_BASED_OUTPATIENT_CLINIC_OR_DEPARTMENT_OTHER): Payer: Self-pay | Admitting: Emergency Medicine

## 2023-07-07 ENCOUNTER — Inpatient Hospital Stay (HOSPITAL_BASED_OUTPATIENT_CLINIC_OR_DEPARTMENT_OTHER)
Admission: EM | Admit: 2023-07-07 | Discharge: 2023-07-10 | DRG: 189 | Disposition: A | Discharge status: Home Health | Attending: Internal Medicine | Admitting: Internal Medicine

## 2023-07-07 ENCOUNTER — Emergency Department (HOSPITAL_BASED_OUTPATIENT_CLINIC_OR_DEPARTMENT_OTHER)

## 2023-07-07 DIAGNOSIS — Z681 Body mass index (BMI) 19 or less, adult: Secondary | ICD-10-CM | POA: Diagnosis not present

## 2023-07-07 DIAGNOSIS — Z885 Allergy status to narcotic agent status: Secondary | ICD-10-CM | POA: Diagnosis not present

## 2023-07-07 DIAGNOSIS — I2510 Atherosclerotic heart disease of native coronary artery without angina pectoris: Secondary | ICD-10-CM | POA: Diagnosis present

## 2023-07-07 DIAGNOSIS — E441 Mild protein-calorie malnutrition: Secondary | ICD-10-CM | POA: Diagnosis present

## 2023-07-07 DIAGNOSIS — Z8249 Family history of ischemic heart disease and other diseases of the circulatory system: Secondary | ICD-10-CM | POA: Diagnosis not present

## 2023-07-07 DIAGNOSIS — F39 Unspecified mood [affective] disorder: Secondary | ICD-10-CM | POA: Diagnosis present

## 2023-07-07 DIAGNOSIS — Z888 Allergy status to other drugs, medicaments and biological substances status: Secondary | ICD-10-CM

## 2023-07-07 DIAGNOSIS — J449 Chronic obstructive pulmonary disease, unspecified: Secondary | ICD-10-CM | POA: Diagnosis present

## 2023-07-07 DIAGNOSIS — Z955 Presence of coronary angioplasty implant and graft: Secondary | ICD-10-CM | POA: Diagnosis not present

## 2023-07-07 DIAGNOSIS — Z7952 Long term (current) use of systemic steroids: Secondary | ICD-10-CM | POA: Diagnosis not present

## 2023-07-07 DIAGNOSIS — Z1152 Encounter for screening for COVID-19: Secondary | ICD-10-CM | POA: Diagnosis not present

## 2023-07-07 DIAGNOSIS — R5381 Other malaise: Secondary | ICD-10-CM | POA: Diagnosis present

## 2023-07-07 DIAGNOSIS — J9622 Acute and chronic respiratory failure with hypercapnia: Secondary | ICD-10-CM | POA: Diagnosis present

## 2023-07-07 DIAGNOSIS — J441 Chronic obstructive pulmonary disease with (acute) exacerbation: Secondary | ICD-10-CM | POA: Diagnosis not present

## 2023-07-07 DIAGNOSIS — Z7982 Long term (current) use of aspirin: Secondary | ICD-10-CM

## 2023-07-07 DIAGNOSIS — Z9981 Dependence on supplemental oxygen: Secondary | ICD-10-CM | POA: Diagnosis not present

## 2023-07-07 DIAGNOSIS — I10 Essential (primary) hypertension: Secondary | ICD-10-CM | POA: Diagnosis present

## 2023-07-07 DIAGNOSIS — Z7902 Long term (current) use of antithrombotics/antiplatelets: Secondary | ICD-10-CM | POA: Diagnosis not present

## 2023-07-07 DIAGNOSIS — J471 Bronchiectasis with (acute) exacerbation: Secondary | ICD-10-CM | POA: Diagnosis not present

## 2023-07-07 DIAGNOSIS — K219 Gastro-esophageal reflux disease without esophagitis: Secondary | ICD-10-CM | POA: Diagnosis present

## 2023-07-07 DIAGNOSIS — A310 Pulmonary mycobacterial infection: Secondary | ICD-10-CM | POA: Insufficient documentation

## 2023-07-07 DIAGNOSIS — J9621 Acute and chronic respiratory failure with hypoxia: Principal | ICD-10-CM | POA: Diagnosis present

## 2023-07-07 DIAGNOSIS — J479 Bronchiectasis, uncomplicated: Secondary | ICD-10-CM

## 2023-07-07 DIAGNOSIS — Z87891 Personal history of nicotine dependence: Secondary | ICD-10-CM

## 2023-07-07 DIAGNOSIS — J9601 Acute respiratory failure with hypoxia: Secondary | ICD-10-CM | POA: Diagnosis present

## 2023-07-07 DIAGNOSIS — Z7951 Long term (current) use of inhaled steroids: Secondary | ICD-10-CM

## 2023-07-07 DIAGNOSIS — R0609 Other forms of dyspnea: Secondary | ICD-10-CM | POA: Diagnosis not present

## 2023-07-07 DIAGNOSIS — Z79899 Other long term (current) drug therapy: Secondary | ICD-10-CM

## 2023-07-07 LAB — CBC WITH DIFFERENTIAL/PLATELET
Abs Immature Granulocytes: 0.03 10*3/uL (ref 0.00–0.07)
Basophils Absolute: 0 10*3/uL (ref 0.0–0.1)
Basophils Relative: 0 %
Eosinophils Absolute: 0 10*3/uL (ref 0.0–0.5)
Eosinophils Relative: 0 %
HCT: 38.8 % (ref 36.0–46.0)
Hemoglobin: 12.2 g/dL (ref 12.0–15.0)
Immature Granulocytes: 0 %
Lymphocytes Relative: 11 %
Lymphs Abs: 0.8 10*3/uL (ref 0.7–4.0)
MCH: 30.2 pg (ref 26.0–34.0)
MCHC: 31.4 g/dL (ref 30.0–36.0)
MCV: 96 fL (ref 80.0–100.0)
Monocytes Absolute: 0.6 10*3/uL (ref 0.1–1.0)
Monocytes Relative: 9 %
Neutro Abs: 5.6 10*3/uL (ref 1.7–7.7)
Neutrophils Relative %: 80 %
Platelets: 243 10*3/uL (ref 150–400)
RBC: 4.04 MIL/uL (ref 3.87–5.11)
RDW: 11.8 % (ref 11.5–15.5)
WBC: 7 10*3/uL (ref 4.0–10.5)
nRBC: 0 % (ref 0.0–0.2)

## 2023-07-07 LAB — I-STAT VENOUS BLOOD GAS, ED
Acid-Base Excess: 9 mmol/L — ABNORMAL HIGH (ref 0.0–2.0)
Bicarbonate: 35.9 mmol/L — ABNORMAL HIGH (ref 20.0–28.0)
Calcium, Ion: 1.21 mmol/L (ref 1.15–1.40)
HCT: 38 % (ref 36.0–46.0)
Hemoglobin: 12.9 g/dL (ref 12.0–15.0)
O2 Saturation: 97 %
Patient temperature: 97.5
Potassium: 4.2 mmol/L (ref 3.5–5.1)
Sodium: 138 mmol/L (ref 135–145)
TCO2: 38 mmol/L — ABNORMAL HIGH (ref 22–32)
pCO2, Ven: 60 mmHg (ref 44–60)
pH, Ven: 7.382 (ref 7.25–7.43)
pO2, Ven: 94 mmHg — ABNORMAL HIGH (ref 32–45)

## 2023-07-07 LAB — BASIC METABOLIC PANEL WITH GFR
Anion gap: 9 (ref 5–15)
BUN: 24 mg/dL — ABNORMAL HIGH (ref 8–23)
CO2: 33 mmol/L — ABNORMAL HIGH (ref 22–32)
Calcium: 9.7 mg/dL (ref 8.9–10.3)
Chloride: 97 mmol/L — ABNORMAL LOW (ref 98–111)
Creatinine, Ser: 0.73 mg/dL (ref 0.44–1.00)
GFR, Estimated: >60 mL/min (ref >60)
Glucose, Bld: 189 mg/dL — ABNORMAL HIGH (ref 70–99)
Potassium: 4.4 mmol/L (ref 3.5–5.1)
Sodium: 139 mmol/L (ref 135–145)

## 2023-07-07 LAB — PRO BRAIN NATRIURETIC PEPTIDE: Pro Brain Natriuretic Peptide: 655 pg/mL — ABNORMAL HIGH (ref <300.0)

## 2023-07-07 MED ORDER — SODIUM CHLORIDE 0.9 % IV SOLN
500.0000 mg | Freq: Once | INTRAVENOUS | Status: AC
  Administered 2023-07-07: 500 mg via INTRAVENOUS
  Filled 2023-07-07: qty 5

## 2023-07-07 MED ORDER — IPRATROPIUM-ALBUTEROL 0.5-2.5 (3) MG/3ML IN SOLN
3.0000 mL | Freq: Once | RESPIRATORY_TRACT | Status: AC
  Administered 2023-07-07: 3 mL via RESPIRATORY_TRACT
  Filled 2023-07-07: qty 3

## 2023-07-07 MED ORDER — IPRATROPIUM-ALBUTEROL 0.5-2.5 (3) MG/3ML IN SOLN: 3.0000 mL | Freq: Four times a day (QID) | RESPIRATORY_TRACT | Status: DC | PRN

## 2023-07-07 MED ORDER — METHYLPREDNISOLONE SODIUM SUCC 125 MG IJ SOLR
125.0000 mg | Freq: Once | INTRAMUSCULAR | Status: AC
  Administered 2023-07-07: 125 mg via INTRAVENOUS
  Filled 2023-07-07: qty 2

## 2023-07-07 MED ORDER — CHLORHEXIDINE GLUCONATE CLOTH 2 % EX PADS
6.0000 | MEDICATED_PAD | Freq: Every day | CUTANEOUS | Status: DC
  Administered 2023-07-08 – 2023-07-09 (×2): 6 via TOPICAL
  Filled 2023-07-07: qty 6

## 2023-07-07 MED ORDER — LORAZEPAM 2 MG/ML IJ SOLN
0.5000 mg | Freq: Once | INTRAMUSCULAR | Status: AC
  Administered 2023-07-07: 0.5 mg via INTRAVENOUS
  Filled 2023-07-07: qty 1

## 2023-07-07 MED ORDER — IOHEXOL 300 MG/ML  SOLN
75.0000 mL | Freq: Once | INTRAMUSCULAR | Status: AC | PRN
  Administered 2023-07-07: 75 mL via INTRAVENOUS

## 2023-07-07 NOTE — ED Notes (Signed)
 Patient insisted she be take of NIV and placed on her 4 liter nasal cannula.  Patient is now on 4 liter nasal cannula.  [facility] is on site ready to transport the patient to [facility].

## 2023-07-07 NOTE — ED Triage Notes (Signed)
 C/o copd exacerbation onset yesterday. Gradually worsening. On 4L [place] at baseline.  Spo2 75% on baseline o2

## 2023-07-07 NOTE — ED Provider Notes (Signed)
 [place] [facility] Provider Note   CSN: [id number] Arrival date & time: 07/07/23  1916     History  Chief Complaint  Patient presents with   Shortness of Breath    Kristy Weeks is a 73 y.o. female.  With history of COPD who presents to the ED for shortness of breath.  4 days of ongoing shortness of breath consistent with prior COPD exacerbations.  Has been using DuoNeb treatments with no avail at home.  On 4 L at baseline but significantly hypoxic in the 70s on home O2 pulse ox.  No fevers chills chest pain.  Did not want to come to the ED but her centimeter today.   Shortness of Breath      Home Medications Prior to Admission medications   Medication Sig Start Date End Date Taking? Authorizing Provider  acetaminophen  (TYLENOL ) 500 MG tablet Take 1,000 mg by mouth every 6 (six) hours as needed for moderate pain (pain score 4-6) or mild pain (pain score 1-3).    [provider]  albuterol  (PROVENTIL ) (2.5 MG/3ML) 0.083% nebulizer solution Take 2.5 mg by nebulization every 6 (six) hours as needed for wheezing or shortness of breath.    [provider]  aspirin  EC 81 MG tablet Take 81 mg by mouth daily.    [provider]  atorvastatin  (LIPITOR) 40 MG tablet Take 40 mg by mouth at bedtime.    [provider]  azithromycin  (ZITHROMAX ) 500 MG tablet Take 1 tablet by mouth on 09/11/2022, then take one tablet by mouth once daily on Monday, Wednesday, Friday. 02/02/23 08/05/23  [provider]  budesonide  (PULMICORT ) 0.5 MG/2ML nebulizer solution USE 1 VIAL VIA NEBULIZER EVERY 12 HOURS    [provider]  carvedilol  (COREG ) 6.25 MG tablet Take 6.25 mg by mouth 2 (two) times daily with a meal.    [provider]  clopidogrel  (PLAVIX ) 75 MG tablet Take 75 mg by mouth daily.    [provider]  folic acid (FOLVITE) 1 MG tablet Take 1 mg by mouth daily. 03/13/22   [provider]  formoterol (PERFOROMIST) 20 MCG/2ML nebulizer solution USE 1 VIAL VIA NEBULIZER EVERY 12 HOURS    [provider]  ipratropium-albuterol  (DUONEB) 0.5-2.5 (3) MG/3ML SOLN Take 3 mLs by nebulization every 6 (six) hours as needed (for shortness of breath).     [provider]  lisinopril  (PRINIVIL ,ZESTRIL ) 10 MG tablet Take 10 mg by mouth at bedtime.    [provider]  predniSONE  (DELTASONE ) 10 MG tablet Take PO 4 tabs daily x 2 days,3 tabs daily x 2 days,2 tabs daily x 2 days 02/21/23   Lesa Rape, MD  predniSONE  (DELTASONE ) 5 MG tablet Take 5-10 mg by mouth See admin instructions. Take 5 mg by mouth on daily and then take 10 mg by mouth once daily the next day. Alternating doses. 02/10/23   [provider]      Allergies    Cefdinir and Hydrocodone -acetaminophen     Review of Systems   Review of Systems  Respiratory:  Positive for shortness of breath.     Physical Exam Updated Vital Signs BP (!) 142/90   Pulse 86   Temp 98.3 F (36.8 C) (Oral)   Resp (!) 22   Ht 5\' 4"  (1.626 m)   Wt 49 kg   SpO2 98%   BMI 18.54 kg/m  Physical Exam Vitals and nursing note reviewed.  HENT:  Head: Normocephalic and atraumatic.  Eyes:     Pupils: Pupils are equal, round, and reactive to light.  Cardiovascular:     Rate and Rhythm: Normal rate and regular rhythm.  Pulmonary:     Effort: Tachypnea and respiratory distress present.  Abdominal:     Palpations: Abdomen is soft.     Tenderness: There is no abdominal tenderness.  Skin:    General: Skin is warm and dry.  Neurological:     Mental Status: She is alert.  Psychiatric:        Mood and Affect: Mood normal.     ED Results / Procedures / Treatments   Labs (all labs ordered are listed, but only abnormal results are displayed) Labs Reviewed  BASIC METABOLIC PANEL WITH GFR - Abnormal; Notable for the following components:      Result Value   Chloride 97 (*)    CO2 33 (*)    Glucose, Bld 189  (*)    BUN 24 (*)    All other components within normal limits  PRO BRAIN NATRIURETIC PEPTIDE - Abnormal; Notable for the following components:   Pro Brain Natriuretic Peptide 655.0 (*)    All other components within normal limits  I-STAT VENOUS BLOOD GAS, ED - Abnormal; Notable for the following components:   pO2, Ven 94 (*)    Bicarbonate 35.9 (*)    TCO2 38 (*)    Acid-Base Excess 9.0 (*)    All other components within normal limits  CBC WITH DIFFERENTIAL/PLATELET    EKG EKG Interpretation Date/Time:  Tuesday July 07 2023 19:34:43 EDT Ventricular Rate:  95 PR Interval:  158 QRS Duration:  105 QT Interval:  337 QTC Calculation: 424 R Axis:   74  Text Interpretation: Sinus rhythm Consider right atrial enlargement Probable LVH with secondary repol abnrm Confirmed by Rafael Bun [phone]) on 07/07/2023 10:00:52 PM  Radiology CT Chest W Contrast Result Date: 07/07/2023 CLINICAL DATA:  Abnormal chest x-ray.  COPD exacerbation. EXAM: CT CHEST WITH CONTRAST TECHNIQUE: Multidetector CT imaging of the chest was performed during intravenous contrast administration. RADIATION DOSE REDUCTION: This exam was performed according to the departmental dose-optimization program which includes automated exposure control, adjustment of the mA and/or kV according to patient size and/or use of iterative reconstruction technique. CONTRAST:  75mL OMNIPAQUE  IOHEXOL  300 MG/ML  SOLN COMPARISON:  Chest x-ray today.  CT 03/16/2023. FINDINGS: Cardiovascular: Heart heart is normal size. Three-vessel coronary artery disease. Tortuous thoracic aorta without aneurysm or dissection. Irregular calcified and noncalcified plaque throughout the descending thoracic aorta. Mediastinum/Nodes: No mediastinal, hilar, or axillary adenopathy. Trachea and esophagus are unremarkable. Thyroid unremarkable. Lungs/Pleura: Severe centrilobular emphysema. Large chronic right upper lobe cavity noted with air-fluid level. Appearance is  unchanged since prior study. Bronchiectasis and mucous plugging in the lower lobes bilaterally, right worse than left. Irregular area consolidation again noted in the right lower lobe unchanged. 2 cm cavitary nodule in the inferior right lower lobe on image 116, also stable. No new or enlarging nodules. No effusions. Upper Abdomen: Layering gallstone within the gallbladder. No acute findings. Musculoskeletal: Chest wall soft tissues are unremarkable. No acute bony abnormality. IMPRESSION: Extensive chronic changes throughout the right lung including large cavity in the right upper lobe, chronic airspace opacity in the right lower lobe with additional cavitary area in the right lower lobe, bronchiectasis and mucous plugging throughout the lower lobes bilaterally, right greater than left. Findings are stable since prior study and are most compatible with chronic MAI. No  new or enlarging pulmonary nodule. No acute pulmonary infiltrate. Coronary artery disease. Aortic Atherosclerosis (ICD10-I70.0) and Emphysema (ICD10-J43.9). Electronically Signed   By: Janeece Mechanic M.D.   On: 07/07/2023 21:16   DG Chest Portable 1 View Result Date: 07/07/2023 CLINICAL DATA:  COPD exacerbation gradually worsening EXAM: PORTABLE CHEST 1 VIEW COMPARISON:  Radiograph 06/11/2023 FINDINGS: Stable cardiac silhouette including ectasias of the thoracic aorta. Aortic atherosclerotic calcification. Large right apical cavitation containing fluid and gas is redemonstrated. Emphysema with bullous change in the left upper lobe. Chronic bronchitic change in the lower lobes. Small right pleural effusion or pleural thickening. Nodular consolidation in the right lower lung. No displaced rib fracture. IMPRESSION: 1. New nodular consolidation in the right lower lung. CT is recommended for further evaluation. 2. Similar large right apical cavitation containing fluid and gas. 3. Small right pleural effusion or pleural thickening. 4. Bullous emphysema.  Electronically Signed   By: Rozell Cornet M.D.   On: 07/07/2023 19:53    Procedures .Critical Care  Performed by: Sallyanne Creamer, DO Authorized by: Sallyanne Creamer, DO   Critical care provider statement:    Critical care time (minutes):  40   Critical care time was exclusive of:  Separately billable procedures and treating other patients and teaching time   Critical care was necessary to treat or prevent imminent or life-threatening deterioration of the following conditions:  Respiratory failure   Critical care was time spent personally by me on the following activities:  Development of treatment plan with patient or surrogate, discussions with consultants, evaluation of patient's response to treatment, examination of patient, ordering and review of laboratory studies, ordering and review of radiographic studies, ordering and performing treatments and interventions, pulse oximetry, re-evaluation of patient's condition, review of old charts and obtaining history from patient or surrogate   I assumed direction of critical care for this patient from another provider in my specialty: no       Medications Ordered in ED Medications  ipratropium-albuterol  (DUONEB) 0.5-2.5 (3) MG/3ML nebulizer solution 3 mL (3 mLs Nebulization Given 07/07/23 1943)  methylPREDNISolone  sodium succinate (SOLU-MEDROL ) 125 mg/2 mL injection 125 mg (125 mg Intravenous Given 07/07/23 1945)  ipratropium-albuterol  (DUONEB) 0.5-2.5 (3) MG/3ML nebulizer solution 3 mL (3 mLs Nebulization Given 07/07/23 2018)  LORazepam (ATIVAN) injection 0.5 mg (0.5 mg Intravenous Given 07/07/23 2033)  azithromycin  (ZITHROMAX ) 500 mg in sodium chloride  0.9 % 250 mL IVPB (500 mg Intravenous New Bag/Given 07/07/23 2058)  iohexol  (OMNIPAQUE ) 300 MG/ML solution 75 mL (75 mLs Intravenous Contrast Given 07/07/23 2045)    ED Course/ Medical Decision Making/ A&P Clinical Course as of 07/07/23 2201  Tue Jul 07, 2023  2014 1. New nodular  consolidation in the right lower lung. CT is recommended for further evaluation. 2. Similar large right apical cavitation containing fluid and gas. 3. Small right pleural effusion or pleural thickening.  Will require CT chest with IV contrast for better evaluation [MP]  2020 ABG shows hypercarbia 60.  No acidosis.  Improved aeration on lung auscultation.  Will give another DuoNeb treatment.  Awaiting CT with IV contrast.  Will provide with dose of azithromycin  here for COPD exacerbation [MP]  2200 CT shows stable chronic findings no acute mass or consolidation.  Patient has remained stable on BiPAP.  Oxygenating well without wheezing.  Require admission for COPD exacerbation. [MP]    Clinical Course User Index [MP] Sallyanne Creamer, DO  Medical Decision Making 73 year old female presented to the ED for COPD exacerbation.  Severe hypoxemia on her baseline of 4 L nasal cannula.  We uptitrated to 6 and 8 L which did not improve her symptoms.  Significant improvement on BiPAP.  Lung sounds notable for diminished breath sounds throughout concerning for severe COPD exacerbation.  Will continue with BiPAP given 5 dose of Solu-Medrol  and give her DuoNeb treatment and reevaluate.  Will obtain laboratory workup VBG EKG chest x-ray.  Amount and/or Complexity of Data Reviewed Labs: ordered. Radiology: ordered.  Risk Prescription drug management. Decision regarding hospitalization.           Final Clinical Impression(s) / ED Diagnoses Final diagnoses:  COPD exacerbation (HCC)  Acute hypoxemic respiratory failure [facility])    Rx / DC Orders ED Discharge Orders     None         Sallyanne Creamer, DO 07/07/23 2201

## 2023-07-07 NOTE — ED Notes (Signed)
 [facility] called for transport.

## 2023-07-08 ENCOUNTER — Inpatient Hospital Stay (HOSPITAL_COMMUNITY)

## 2023-07-08 DIAGNOSIS — J9622 Acute and chronic respiratory failure with hypercapnia: Secondary | ICD-10-CM

## 2023-07-08 DIAGNOSIS — J9621 Acute and chronic respiratory failure with hypoxia: Secondary | ICD-10-CM | POA: Diagnosis not present

## 2023-07-08 DIAGNOSIS — R0609 Other forms of dyspnea: Secondary | ICD-10-CM | POA: Diagnosis not present

## 2023-07-08 DIAGNOSIS — J471 Bronchiectasis with (acute) exacerbation: Secondary | ICD-10-CM | POA: Diagnosis not present

## 2023-07-08 DIAGNOSIS — J441 Chronic obstructive pulmonary disease with (acute) exacerbation: Secondary | ICD-10-CM | POA: Diagnosis not present

## 2023-07-08 DIAGNOSIS — A310 Pulmonary mycobacterial infection: Secondary | ICD-10-CM | POA: Insufficient documentation

## 2023-07-08 DIAGNOSIS — J479 Bronchiectasis, uncomplicated: Secondary | ICD-10-CM

## 2023-07-08 LAB — CBC
HCT: 36.3 % (ref 36.0–46.0)
Hemoglobin: 11.2 g/dL — ABNORMAL LOW (ref 12.0–15.0)
MCH: 30.4 pg (ref 26.0–34.0)
MCHC: 30.9 g/dL (ref 30.0–36.0)
MCV: 98.6 fL (ref 80.0–100.0)
Platelets: 198 10*3/uL (ref 150–400)
RBC: 3.68 MIL/uL — ABNORMAL LOW (ref 3.87–5.11)
RDW: 11.9 % (ref 11.5–15.5)
WBC: 4.4 10*3/uL (ref 4.0–10.5)
nRBC: 0 % (ref 0.0–0.2)

## 2023-07-08 LAB — RESPIRATORY PANEL BY PCR

## 2023-07-08 LAB — BASIC METABOLIC PANEL WITH GFR
Anion gap: 10 (ref 5–15)
BUN: 24 mg/dL — ABNORMAL HIGH (ref 8–23)
CO2: 33 mmol/L — ABNORMAL HIGH (ref 22–32)
Calcium: 8.9 mg/dL (ref 8.9–10.3)
Chloride: 95 mmol/L — ABNORMAL LOW (ref 98–111)
Creatinine, Ser: 0.63 mg/dL (ref 0.44–1.00)
GFR, Estimated: >60 mL/min (ref >60)
Glucose, Bld: 198 mg/dL — ABNORMAL HIGH (ref 70–99)
Potassium: 4 mmol/L (ref 3.5–5.1)
Sodium: 138 mmol/L (ref 135–145)

## 2023-07-08 LAB — EXPECTORATED SPUTUM ASSESSMENT W GRAM STAIN, RFLX TO RESP C

## 2023-07-08 LAB — ECHOCARDIOGRAM COMPLETE
AR max vel: 1.61 cm2
AV Area VTI: 1.9 cm2
AV Area mean vel: 1.52 cm2
AV Mean grad: 8 mmHg
AV Peak grad: 12.4 mmHg
Ao pk vel: 1.76 m/s
Area-P 1/2: 2.89 cm2
Height: 64 in
P 1/2 time: 559 ms
S' Lateral: 3.5 cm
Weight: 1728 [oz_av]

## 2023-07-08 LAB — GLUCOSE, CAPILLARY: Glucose-Capillary: 149 mg/dL — ABNORMAL HIGH (ref 70–99)

## 2023-07-08 LAB — SARS CORONAVIRUS 2 BY RT PCR: SARS Coronavirus 2 by RT PCR: NEGATIVE

## 2023-07-08 LAB — MAGNESIUM: Magnesium: 2 mg/dL (ref 1.7–2.4)

## 2023-07-08 LAB — MRSA NEXT GEN BY PCR, NASAL: MRSA by PCR Next Gen: NOT DETECTED

## 2023-07-08 LAB — PHOSPHORUS: Phosphorus: 3 mg/dL (ref 2.5–4.6)

## 2023-07-08 MED ORDER — METHYLPREDNISOLONE SODIUM SUCC 125 MG IJ SOLR
60.0000 mg | Freq: Two times a day (BID) | INTRAMUSCULAR | Status: DC
  Administered 2023-07-08 – 2023-07-10 (×5): 60 mg via INTRAVENOUS
  Filled 2023-07-08 (×5): qty 2

## 2023-07-08 MED ORDER — ATORVASTATIN CALCIUM 40 MG PO TABS
40.0000 mg | ORAL_TABLET | Freq: Every day | ORAL | Status: DC
  Administered 2023-07-08 – 2023-07-09 (×2): 40 mg via ORAL
  Filled 2023-07-08 (×2): qty 1

## 2023-07-08 MED ORDER — SODIUM CHLORIDE 0.9 % IN NEBU
3.0000 mL | INHALATION_SOLUTION | Freq: Four times a day (QID) | RESPIRATORY_TRACT | Status: DC
  Administered 2023-07-08 – 2023-07-10 (×10): 3 mL via RESPIRATORY_TRACT
  Filled 2023-07-08 (×11): qty 3

## 2023-07-08 MED ORDER — ALBUTEROL SULFATE (2.5 MG/3ML) 0.083% IN NEBU
2.5000 mg | INHALATION_SOLUTION | RESPIRATORY_TRACT | Status: DC | PRN
  Administered 2023-07-08 (×2): 2.5 mg via RESPIRATORY_TRACT
  Filled 2023-07-08 (×2): qty 3

## 2023-07-08 MED ORDER — MELATONIN 3 MG PO TABS: 6.0000 mg | ORAL_TABLET | Freq: Every evening | ORAL | Status: DC | PRN

## 2023-07-08 MED ORDER — CARVEDILOL 6.25 MG PO TABS
6.2500 mg | ORAL_TABLET | Freq: Two times a day (BID) | ORAL | Status: DC
  Administered 2023-07-08 – 2023-07-10 (×5): 6.25 mg via ORAL
  Filled 2023-07-08 (×5): qty 1

## 2023-07-08 MED ORDER — ACETAMINOPHEN 500 MG PO TABS
1000.0000 mg | ORAL_TABLET | Freq: Four times a day (QID) | ORAL | Status: DC | PRN
  Administered 2023-07-08 – 2023-07-09 (×6): 1000 mg via ORAL
  Filled 2023-07-08 (×7): qty 2

## 2023-07-08 MED ORDER — METHYLPREDNISOLONE SODIUM SUCC 125 MG IJ SOLR: 60.0000 mg | Freq: Two times a day (BID) | INTRAMUSCULAR | Status: DC

## 2023-07-08 MED ORDER — POLYETHYLENE GLYCOL 3350 17 G PO PACK: 17.0000 g | PACK | Freq: Every day | ORAL | Status: DC | PRN

## 2023-07-08 MED ORDER — IPRATROPIUM-ALBUTEROL 0.5-2.5 (3) MG/3ML IN SOLN
3.0000 mL | Freq: Four times a day (QID) | RESPIRATORY_TRACT | Status: DC
  Administered 2023-07-08 – 2023-07-10 (×8): 3 mL via RESPIRATORY_TRACT
  Filled 2023-07-08 (×10): qty 3

## 2023-07-08 MED ORDER — BUDESONIDE 0.5 MG/2ML IN SUSP
0.5000 mg | Freq: Two times a day (BID) | RESPIRATORY_TRACT | Status: DC
  Administered 2023-07-08 – 2023-07-10 (×5): 0.5 mg via RESPIRATORY_TRACT
  Filled 2023-07-08 (×5): qty 2

## 2023-07-08 MED ORDER — ENOXAPARIN SODIUM 40 MG/0.4ML IJ SOSY
40.0000 mg | PREFILLED_SYRINGE | INTRAMUSCULAR | Status: DC
  Administered 2023-07-08 – 2023-07-09 (×2): 40 mg via SUBCUTANEOUS
  Filled 2023-07-08 (×3): qty 0.4

## 2023-07-08 MED ORDER — SODIUM CHLORIDE 0.9 % IV SOLN
1.0000 g | INTRAVENOUS | Status: DC
  Administered 2023-07-08 – 2023-07-09 (×2): 1 g via INTRAVENOUS
  Filled 2023-07-08 (×3): qty 10

## 2023-07-08 MED ORDER — FOLIC ACID 1 MG PO TABS
1.0000 mg | ORAL_TABLET | Freq: Every day | ORAL | Status: DC
  Administered 2023-07-08 – 2023-07-10 (×3): 1 mg via ORAL
  Filled 2023-07-08 (×3): qty 1

## 2023-07-08 MED ORDER — LISINOPRIL 10 MG PO TABS
10.0000 mg | ORAL_TABLET | Freq: Every day | ORAL | Status: DC
  Administered 2023-07-09: 10 mg via ORAL
  Filled 2023-07-08: qty 1

## 2023-07-08 MED ORDER — SODIUM CHLORIDE 0.9% FLUSH
3.0000 mL | Freq: Two times a day (BID) | INTRAVENOUS | Status: DC
  Administered 2023-07-08 – 2023-07-10 (×6): 3 mL via INTRAVENOUS

## 2023-07-08 MED ORDER — ALPRAZOLAM 0.5 MG PO TABS
0.5000 mg | ORAL_TABLET | Freq: Every evening | ORAL | Status: DC | PRN
  Administered 2023-07-08 – 2023-07-09 (×2): 0.5 mg via ORAL
  Filled 2023-07-08 (×3): qty 1

## 2023-07-08 MED ORDER — ORAL CARE MOUTH RINSE: 15.0000 mL | OROMUCOSAL | Status: DC | PRN

## 2023-07-08 MED ORDER — AZITHROMYCIN 250 MG PO TABS
500.0000 mg | ORAL_TABLET | ORAL | Status: DC
  Administered 2023-07-08: 500 mg via ORAL
  Filled 2023-07-08 (×2): qty 2

## 2023-07-08 MED ORDER — ALBUTEROL SULFATE (2.5 MG/3ML) 0.083% IN NEBU
2.5000 mg | INHALATION_SOLUTION | RESPIRATORY_TRACT | Status: DC | PRN
  Administered 2023-07-08: 2.5 mg via RESPIRATORY_TRACT
  Filled 2023-07-08: qty 3

## 2023-07-08 MED ORDER — ARFORMOTEROL TARTRATE 15 MCG/2ML IN NEBU
15.0000 ug | INHALATION_SOLUTION | Freq: Two times a day (BID) | RESPIRATORY_TRACT | Status: DC
  Administered 2023-07-08 – 2023-07-10 (×5): 15 ug via RESPIRATORY_TRACT
  Filled 2023-07-08 (×5): qty 2

## 2023-07-08 MED ORDER — CLOPIDOGREL BISULFATE 75 MG PO TABS
75.0000 mg | ORAL_TABLET | Freq: Every day | ORAL | Status: DC
  Administered 2023-07-08 – 2023-07-10 (×3): 75 mg via ORAL
  Filled 2023-07-08 (×3): qty 1

## 2023-07-08 NOTE — Evaluation (Signed)
 Occupational Therapy Evaluation Patient Details Name: Kristy Weeks MRN: [id number] DOB: Aug 22, 1950 Today's Date: 07/08/2023   History of Present Illness   Patient is a 73 year old female who presented with 2--3 day history of SOB. Patient was admitted with COPD exacerbation,acute on chronic hypoxic and hypercapnic respiratory failure. PMH; COPD, 4L/min at baseline, MCA infection chronic cavity lesions, bronchiectasis, HTN, esophageal dysmotility.     Clinical Impressions Patient evaluated by Occupational Therapy with no further acute OT needs identified. All education has been completed and the patient has no further questions. Patient appears to be at baseline for seated ADLs at this time.  See below for any follow-up Occupational Therapy or equipment needs. OT is signing off. Thank you for this referral.      If plan is discharge home, recommend the following:   Assistance with cooking/housework;Assist for transportation;Help with stairs or ramp for entrance      Equipment Recommendations   None recommended by OT      Precautions/Restrictions   Precautions Precautions: Fall Precaution/Restrictions Comments: monitor O2 on 4L/min at baseline Restrictions Weight Bearing Restrictions Per Provider Order: No     Mobility Bed Mobility Overal bed mobility: Needs Assistance Bed Mobility: Supine to Sit     Supine to sit: Supervision                          Balance Overall balance assessment: Mild deficits observed, not formally tested                   ADL either performed or assessed with clinical judgement   ADL                   General ADL Comments: patient was noted to have O2 drop to 84% on 4L/min with movement in hallway with RW. patient reported that she was not moving much at home prior level. patient endorsed that she is at baseline for ADls at this time with patient able to complete LB Dressing with figure four positioning in  recliner in room. patient reported she does not need to work on bathing tasks as she does these seated at home at baseline. patient endorsed being at baseline for ADLs at this time. OT to sign off     Vision Patient Visual Report: No change from baseline              Pertinent Vitals/Pain Pain Assessment Pain Assessment: No/denies pain     Extremity/Trunk Assessment Upper Extremity Assessment Upper Extremity Assessment: Overall WFL for tasks assessed   Lower Extremity Assessment Lower Extremity Assessment: Defer to PT evaluation   Cervical / Trunk Assessment Cervical / Trunk Assessment: Normal   Communication     Cognition Arousal: Alert Behavior During Therapy: WFL for tasks assessed/performed Cognition: No apparent impairments             Following commands: Intact                  Home Living Family/patient expects to be discharged to:: Private residence Living Arrangements: Children Available Help at Discharge: Family;Available PRN/intermittently Type of Home: House Home Access: Stairs to enter Entrance Stairs-Number of Steps: 2-3   Home Layout: One level               Home Equipment: Agricultural consultant (2 wheels);Cane - single point;Tub bench          Prior Functioning/Environment Prior Level of Function : Independent/Modified  Independent               ADLs Comments: son helps with IADLs.            OT Goals(Current goals can be found in the care plan section)   Acute Rehab OT Goals OT Goal Formulation: All assessment and education complete, DC therapy   OT Frequency:          AM-PAC OT 6 Clicks Daily Activity     Outcome Measure Help from another person eating meals?: None Help from another person taking care of personal grooming?: None Help from another person toileting, which includes using toliet, bedpan, or urinal?: None Help from another person bathing (including washing, rinsing, drying)?: None Help from another  person to put on and taking off regular upper body clothing?: None Help from another person to put on and taking off regular lower body clothing?: None 6 Click Score: 24   End of Session Equipment Utilized During Treatment: Gait belt;Rolling walker (2 wheels);Oxygen Nurse Communication: Mobility status  Activity Tolerance: Patient tolerated treatment well Patient left: in chair;with call bell/phone within reach;with chair alarm set                   Time:  -1006   Charges:  OT General Charges $OT Visit: 1 Visit OT Evaluation $OT Eval Low Complexity: 1 Low  Raynesha Tiedt OTR/L, MS Acute Rehabilitation Department Office# [phone]   Jame Maze 07/08/2023, 10:16 AM

## 2023-07-08 NOTE — TOC Initial Note (Signed)
 Transition of Care [facility]) - Initial/Assessment Note    Patient Details  Name: Kristy Weeks MRN: [id number] Date of Birth: 1950/09/26  [facility]) CM/SW Contact:    Tessie Fila, RN Phone Number: 07/08/2023, 2:08 PM  Clinical Narrative:                 NCM met with pt at bedside to discuss PT recommendation for HHPT. Pt is in agreement with recommendation. States she has used [facility] in the past and wishes to have services through their agency again. NCM spoke with [facility] with [facility] and she agrees to accept pt for HHPT services. Pt will need HH orders at DC. Pt states she uses 4L O2 at baseline and [employer] supplies her O2 needs. She has her portable O2 tank at bedside. Pt states she lives with her son Kristy Weeks and he is her POC at [phone]. Pt has RW, W/C, and BSC in the home. There are no SDOH risks identified. TOC following for DC needs.  Expected Discharge Plan: Home w Home Health Services Barriers to Discharge: Continued Medical Work up   Patient Goals and CMS Choice Patient states their goals for this hospitalization and ongoing recovery are:: To return home CMS Medicare.gov Compare Post Acute Care list provided to:: Patient Choice offered to / list presented to : Patient [place] ownership interest in [facility].provided to:: Patient    Expected Discharge Plan and Services In-house Referral: NA Discharge Planning Services: CM Consult Post Acute Care Choice: Home Health Living arrangements for the past 2 months: Single Family Home                 DME Arranged: N/A DME Agency: NA       HH Arranged: PT HH Agency: Other - See comment ([facility]) Date HH Agency Contacted: 07/08/23 Time HH Agency Contacted: 1407 Representative spoke with at [facility] Agency: Senaida Dama with [facility]  Prior Living Arrangements/Services Living arrangements for the past 2 months: Single Family Home Lives with:: Adult Children Patient  language and need for interpreter reviewed:: Yes Do you feel safe going back to the place where you live?: Yes      Need for Family Participation in Patient Care: Yes (Comment) Care giver support system in place?: Yes (comment) Current home services: DME (Oxygen, RW, W/C, Bedside Com[employer]) Criminal Activity/Legal Involvement Pertinent to Current Situation/Hospitalization: No - Comment as needed  Activities of Daily Living   ADL Screening (condition at time of admission) Independently performs ADLs?: No Does the patient have a NEW difficulty with bathing/dressing/toileting/self-feeding that is expected to last >3 days?: Yes (Initiates electronic notice to provider for possible OT consult) Does the patient have a NEW difficulty with getting in/out of bed, walking, or climbing stairs that is expected to last >3 days?: Yes (Initiates electronic notice to provider for possible PT consult) Does the patient have a NEW difficulty with communication that is expected to last >3 days?: No Is the patient deaf or have difficulty hearing?: No Does the patient have difficulty seeing, even when wearing glasses/contacts?: Yes Does the patient have difficulty concentrating, remembering, or making decisions?: No  Permission Sought/Granted Permission sought to share information with : Family Supports, Magazine features editor Permission granted to share information with : Yes, Verbal Permission Granted  Share Information with NAME: Sharena, Dibenedetto [facility])  [phone]  Permission granted to share info w AGENCY: [facility]        Emotional Assessment Appearance:: Appears  stated age Attitude/Demeanor/Rapport: Engaged Affect (typically observed): Accepting, Appropriate Orientation: : Oriented to Self, Oriented to Place, Oriented to  Time, Oriented to Situation Alcohol / Substance Use: Not Applicable Psych Involvement: No (comment)  Admission diagnosis:  COPD exacerbation (HCC)  [J44.1] Acute respiratory failure with hypoxia (HCC) [J96.01] Acute hypoxemic respiratory failure (HCC) [J96.01] Patient Active Problem List   Diagnosis Date Noted   Bronchiectasis (HCC) 07/08/2023   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 [place] [address] Phone: [phone] Fax: [phone]     Social Drivers of Health (SDOH) Social History: SDOH Screenings   Food Insecurity: Unknown (07/08/2023)  Housing: Low Risk  (07/08/2023)  Transportation Needs: No Transportation Needs (07/08/2023)  Utilities: Not At Risk (07/08/2023)  Social Connections: Unknown (07/08/2023)  Tobacco Use: Medium Risk (07/07/2023)   SDOH Interventions:     Readmission Risk Interventions    07/08/2023    2:02 PM 02/19/2023    1:48 PM  Readmission Risk Prevention Plan  Post Dischage Appt  Complete  Medication Screening  Complete  Transportation Screening Complete Complete  PCP or Specialist Appt within 5-7 Days Complete   Home Care Screening Complete   Medication Review (RN CM) Complete

## 2023-07-08 NOTE — Hospital Course (Addendum)
 Same day note  Kristy Weeks is a 72 y.o. female with past medical history of of severe COPD and chronic hypoxic respiratory failure on 4L [place], prior MAC infection chronic cavitary lesions, bronchiectasis, CAD, hypertension, GERD, mood d/o, wife transferred from [facility] ED for acute on chronic hypoxic respiratory failure and COPD exacerbation initially requiring BiPAP.   Patient seen and examined at bedside.  Patient was admitted to the hospital for shortness of breath cough increased secretions.  At the time of my evaluation, patient complains of  Physical examination reveals  Laboratory data and imaging was reviewed  Assessment and Plan.  COPD exacerbation, severe, initially requiring BiPAP  Acute on chronic hypoxic and hypercapneic respiratory failure  Hx pulmonary MAC, with cavitary lesions, bronchiectasis and mucous plugging;  Presented with acute shortness of breath dyspnea and difficulty clearing secretions. On home 4L O2 initial O2 sat 76%, placed on BiPAP.subsequently went for left of oxygen.  Continue Solu-Medrol  Rocephin  and Zithromax  saline nebs budesonide  albuterol  incentive spirometry flutter valve.  Would benefit from pulmonary rehab as outpatient.  Respiratory viral panel negative.  Follow sputum cultures.  No leukocytosis   Elevated proBNP without signs of HF  Check 2D echocardiogram   CAD: Contiue home plavix .  Continue Lipitor.  HTN: Continue home coreg , Lisinopril    Esophageal dymotility / GERD: continue home famotidine   Mood d/o: On as needed Xanax .  Debility deconditioning.  Will get PT OT evaluation.    No Charge  Signed,  Lindwood Rhody, MD [facility]

## 2023-07-08 NOTE — H&P (Signed)
 History and Physical    Dima Mini ZOX:[id number] DOB: 06-Dec-1950 DOA: 07/07/2023  PCP: Podraza, Cole Christopher, PA-C   Patient coming from: Transfer from [facility] ED    Chief Complaint:  Chief Complaint  Patient presents with   Shortness of Breath    HPI:  Kristy Weeks is a 73 y.o. female with hx of severe COPD and chronic hypoxic respiratory failure on 4L [place], prior MAC infection chronic cavitary lesions, bronchiectasis, CAD, hypertension, GERD, mood d/o, who is transferred from [facility] ED for acute on chronic hypoxic respiratory failure and COPD exacerbation initially requiring BiPAP; see additional ED course below.   On interview with the patient she is currently on 4L and feeling much better since initial ED arrival. She reports recent worsening in SOB over the past 2-3 days. Attributes to seasonal changes. She has chronic cough which is unchanged but always has difficulty clearing secretions. No fevers, chills, URI symptoms, sick contacts. She has been using her inahlers at home as Rx'd.    Review of Systems:  ROS complete and negative except as marked above   Allergies  Allergen Reactions   Cefdinir Other (See Comments)    Abdominal pain   Hydrocodone -Acetaminophen  Other (See Comments)    nausea    Prior to Admission medications   Medication Sig Start Date End Date Taking? Authorizing Provider  acetaminophen  (TYLENOL ) 500 MG tablet Take 1,000 mg by mouth every 6 (six) hours as needed for moderate pain (pain score 4-6) or mild pain (pain score 1-3).    [provider]  albuterol  (PROVENTIL ) (2.5 MG/3ML) 0.083% nebulizer solution Take 2.5 mg by nebulization every 6 (six) hours as needed for wheezing or shortness of breath.    [provider]  aspirin  EC 81 MG tablet Take 81 mg by mouth daily.    [provider]  atorvastatin  (LIPITOR) 40 MG tablet Take 40 mg by mouth at bedtime.    [provider]  azithromycin  (ZITHROMAX ) 500 MG  tablet Take 1 tablet by mouth on 09/11/2022, then take one tablet by mouth once daily on Monday, Wednesday, Friday. 02/02/23 08/05/23  [provider]  budesonide  (PULMICORT ) 0.5 MG/2ML nebulizer solution USE 1 VIAL VIA NEBULIZER EVERY 12 HOURS    [provider]  carvedilol  (COREG ) 6.25 MG tablet Take 6.25 mg by mouth 2 (two) times daily with a meal.    [provider]  clopidogrel  (PLAVIX ) 75 MG tablet Take 75 mg by mouth daily.    [provider]  folic acid (FOLVITE) 1 MG tablet Take 1 mg by mouth daily. 03/13/22   [provider]  formoterol (PERFOROMIST) 20 MCG/2ML nebulizer solution USE 1 VIAL VIA NEBULIZER EVERY 12 HOURS    [provider]  ipratropium-albuterol  (DUONEB) 0.5-2.5 (3) MG/3ML SOLN Take 3 mLs by nebulization every 6 (six) hours as needed (for shortness of breath).     [provider]  lisinopril  (PRINIVIL ,ZESTRIL ) 10 MG tablet Take 10 mg by mouth at bedtime.    [provider]  predniSONE  (DELTASONE ) 10 MG tablet Take PO 4 tabs daily x 2 days,3 tabs daily x 2 days,2 tabs daily x 2 days 02/21/23   Lesa Rape, MD  predniSONE  (DELTASONE ) 5 MG tablet Take 5-10 mg by mouth See admin instructions. Take 5 mg by mouth on daily and then take 10 mg by mouth once daily the next day. Alternating doses. 02/10/23   [provider]    Past Medical History:  Diagnosis Date   COPD (  chronic obstructive pulmonary disease) (HCC)    wears 2L most of the time   Coronary artery disease    Hypertension     Past Surgical History:  Procedure Laterality Date   CORONARY ANGIOPLASTY WITH STENT PLACEMENT       reports that she quit smoking about 7 years ago. Her smoking use included cigarettes. She started smoking about 47 years ago. She has a 40 pack-year smoking history. She has never used smokeless tobacco. She reports that she does not drink alcohol and does not use drugs.  Family History  Problem Relation Age of Onset    Heart failure Father 77     Physical Exam: Vitals:   07/07/23 2100 07/08/23 0000 07/08/23 0008 07/08/23 0100  BP: (!) 142/90 (!) 191/159 (!) 150/76 133/78  Pulse: 86   87  Resp: (!) 22 (!) 24  18  Temp:  98.6 F (37 C)    TempSrc:  Oral    SpO2: 98% 92%  94%  Weight:      Height:        Gen: Awake, alert, chronically ill appearing.   CV: Regular, normal S1, S2, 1/6 SEM  Resp: Slight increased WOB, tachypneic, on [place], diffuse wheezes and limited air movement.  Abd: Flat, normoactive, nontender MSK: Symmetric, no edema  Skin: No rashes or lesions to exposed skin  Neuro: Alert and interactive  Psych: euthymic, appropriate    Data review:   Labs reviewed, notable for:   VBG 7.38 / 60, bicarb 33  Pro BNP 655  WBC 7  Micro:  Results for orders placed or performed during the hospital encounter of 07/07/23  MRSA Next Gen by PCR, Nasal     Status: None   Collection Time: 07/07/23 11:48 PM   Specimen: Nasal Mucosa; Nasal Swab  Result Value Ref Range Status   MRSA by PCR Next Gen NOT DETECTED NOT DETECTED Final    Comment: (NOTE) The GeneXpert MRSA Assay (FDA approved for NASAL specimens only), is one component of a comprehensive MRSA colonization surveillance program. It is not intended to diagnose MRSA infection nor to guide or monitor treatment for MRSA infections. Test performance is not FDA approved in patients less than 6 years old. Performed at [facility], [address] [place], [place] [address]     Imaging reviewed:  CT Chest W Contrast Result Date: 07/07/2023 CLINICAL DATA:  Abnormal chest x-ray.  COPD exacerbation. EXAM: CT CHEST WITH CONTRAST TECHNIQUE: Multidetector CT imaging of the chest was performed during intravenous contrast administration. RADIATION DOSE REDUCTION: This exam was performed according to the departmental dose-optimization program which includes automated exposure control, adjustment of the mA and/or kV according to  patient size and/or use of iterative reconstruction technique. CONTRAST:  75mL OMNIPAQUE  IOHEXOL  300 MG/ML  SOLN COMPARISON:  Chest x-ray today.  CT 03/16/2023. FINDINGS: Cardiovascular: Heart heart is normal size. Three-vessel coronary artery disease. Tortuous thoracic aorta without aneurysm or dissection. Irregular calcified and noncalcified plaque throughout the descending thoracic aorta. Mediastinum/Nodes: No mediastinal, hilar, or axillary adenopathy. Trachea and esophagus are unremarkable. Thyroid unremarkable. Lungs/Pleura: Severe centrilobular emphysema. Large chronic right upper lobe cavity noted with air-fluid level. Appearance is unchanged since prior study. Bronchiectasis and mucous plugging in the lower lobes bilaterally, right worse than left. Irregular area consolidation again noted in the right lower lobe unchanged. 2 cm cavitary nodule in the inferior right lower lobe on image 116, also stable. No new or enlarging nodules. No effusions. Upper Abdomen: Layering  gallstone within the gallbladder. No acute findings. Musculoskeletal: Chest wall soft tissues are unremarkable. No acute bony abnormality. IMPRESSION: Extensive chronic changes throughout the right lung including large cavity in the right upper lobe, chronic airspace opacity in the right lower lobe with additional cavitary area in the right lower lobe, bronchiectasis and mucous plugging throughout the lower lobes bilaterally, right greater than left. Findings are stable since prior study and are most compatible with chronic MAI. No new or enlarging pulmonary nodule. No acute pulmonary infiltrate. Coronary artery disease. Aortic Atherosclerosis (ICD10-I70.0) and Emphysema (ICD10-J43.9). Electronically Signed   By: Janeece Mechanic M.D.   On: 07/07/2023 21:16   DG Chest Portable 1 View Result Date: 07/07/2023 CLINICAL DATA:  COPD exacerbation gradually worsening EXAM: PORTABLE CHEST 1 VIEW COMPARISON:  Radiograph 06/11/2023 FINDINGS: Stable  cardiac silhouette including ectasias of the thoracic aorta. Aortic atherosclerotic calcification. Large right apical cavitation containing fluid and gas is redemonstrated. Emphysema with bullous change in the left upper lobe. Chronic bronchitic change in the lower lobes. Small right pleural effusion or pleural thickening. Nodular consolidation in the right lower lung. No displaced rib fracture. IMPRESSION: 1. New nodular consolidation in the right lower lung. CT is recommended for further evaluation. 2. Similar large right apical cavitation containing fluid and gas. 3. Small right pleural effusion or pleural thickening. 4. Bullous emphysema. Electronically Signed   By: Rozell Cornet M.D.   On: 07/07/2023 19:53   Reviewed CT chest      ED Course:  Initial sat 75% on 4L O2. Was placed on BiPAP for WOB and hypoxia but ultimately able to be transitioned off back to home 4L o2. Treated with methylprednisolone , azithromycin , nebs.    Assessment/Plan:  73 y.o. female with hx severe COPD and chronic hypoxic respiratory failure on 4L [place], prior MAC infection chronic cavitary lesions, bronchiectasis, CAD, hypertension, Esophageal dysmotility/GERD, mood d/o, who is transferred from [facility] ED for acute on chronic hypoxic respiratory failure and COPD exacerbation initially requiring BiPAP.   COPD exacerbation, severe, initially requiring BiPAP  Acute on chronic hypoxic and hypercapneic respiratory failure  Hx pulmonary MAC, with cavitary lesions, bronchiectasis and mucous plugging; stable.  Acute worsening of SOB, chronic cough with difficulty clearing secretions. On home 4L O2 initial O2 sat 76%, placed on BiPAP, ultimately weaned back to home 4L with normalized sats. Tachypneic in the mid 20s. Exam with wheezing and poor air movement. Likely COPD exacerbation, and mucous plugging related to bronchiectasis causing decompensation.  - Continue methylprednisolone  60 mg IV q 12 hr for now until further  improvement.  - Ceftriaxone  1 g IV daily x 5 days, home azithromycin  500 mg three times per week  - Check COVID, RVP, sputum culture - Start Saline nebs followed by Duoneb q6 to help mobilize secretions, consider saline / hypertonic neb use at home.  - Home Budesonide  and Formoterol equivalent, albuterol  prn, incentive spirometer, flutter valve, encourage out of bed to chair. -Home O2 evaluation prior to discharge -Consider referral to pulmonary rehab, pulm f/u outpatient.   Elevated proBNP without signs of HF  - Recommend for TTE outpatient  Chronic medical problems:  CAD: Contiue home plavix .. ? If taking aspirin . Continue home Atovastatin HTN: Continue home coreg , Lisinopril   Esophageal dymotility / GERD: continue home famotidine  Mood d/o: continue home xanax  prn   Body mass index is 18.54 kg/m. Mild protein calorie malnutrition    DVT prophylaxis:  Lovenox  Code Status:  Full Code Diet:  Diet Orders (From admission, onward)  Start     Ordered   07/08/23 0037  Diet regular Room service appropriate? Yes; Fluid consistency: Thin  Diet effective now       Question Answer Comment  Room service appropriate? Yes   Fluid consistency: Thin      07/08/23 0040           Family Communication:  None   Consults:  None   Admission status:   Inpatient, Step Down Unit  Severity of Illness: The appropriate patient status for this patient is INPATIENT. Inpatient status is judged to be reasonable and necessary in order to provide the required intensity of service to ensure the patient's safety. The patient's presenting symptoms, physical exam findings, and initial radiographic and laboratory data in the context of their chronic comorbidities is felt to place them at high risk for further clinical deterioration. Furthermore, it is not anticipated that the patient will be medically stable for discharge from the hospital within 2 midnights of admission.   * I certify that at the point of  admission it is my clinical judgment that the patient will require inpatient hospital care spanning beyond 2 midnights from the point of admission due to high intensity of service, high risk for further deterioration and high frequency of surveillance required.*   Arnulfo Larch, MD [facility]  How to contact the [facility] Attending or Consulting provider 7A - 7P or covering provider during after hours 7P -7A, for this patient.  Check the care team in [facility] and look for a) attending/consulting [facility] provider listed and b) the [facility] team listed Log into [email or web address] and use [place]'s universal password to access. If you do not have the password, please contact the hospital operator. Locate the [facility] provider you are looking for under [facility] and page to a number that you can be directly reached. If you still have difficulty reaching the provider, please page the [facility] (Director on Call) for the Hospitalists listed on amion for assistance.  07/08/2023, 1:35 AM

## 2023-07-08 NOTE — Progress Notes (Signed)
 Same day note  Kristy Weeks is a 73 y.o. female with past medical history of of severe COPD and chronic hypoxic respiratory failure on 4L [place], prior MAC infection chronic cavitary lesions, bronchiectasis, CAD, hypertension, GERD, mood disorder was transferred from [facility] ED for acute on chronic hypoxic respiratory failure and COPD exacerbation initially requiring BiPAP.   Patient seen and examined at bedside.  Patient was admitted to the hospital for shortness of breath cough increased secretions.  At the time of my evaluation, patient complains of shortness of breath cough but denies pain fever or chills.  Physical examination reveals elderly female, on nasal cannula oxygen, coarse breath sounds noted bilaterally.  Laboratory data and imaging was reviewed  Assessment and Plan.  COPD exacerbation, severe, initially requiring BiPAP  Acute on chronic hypoxic and hypercapneic respiratory failure  Hx pulmonary MAC, with cavitary lesions, bronchiectasis and mucous plugging;  Presented with acute shortness of breath dyspnea and difficulty clearing secretions. On home 4L O2 initial O2 sat 76%, placed on BiPAP.subsequently went for left of oxygen.  Continue Solu-Medrol  Rocephin  and Zithromax  saline nebs budesonide  albuterol  incentive spirometry flutter valve.  Would benefit from pulmonary rehab as outpatient.  Respiratory viral panel negative.  Follow sputum cultures.  No leukocytosis.  Check 2D echocardiogram.   Elevated proBNP without signs of HF  Check 2D echocardiogram.  No peripheral edema.   CAD: Contiue home plavix .  Continue Lipitor.  HTN: Continue home coreg , Lisinopril .  Will continue to monitor blood pressure.  Esophageal dymotility / GERD: continue home famotidine   Mood d/o: On as needed Xanax .  Debility deconditioning.  Will get PT OT evaluation.    No Charge  Signed,  Lindwood Rhody, MD [facility]

## 2023-07-08 NOTE — Evaluation (Signed)
 Physical Therapy Evaluation Patient Details Name: Kristy Weeks MRN: [id number] DOB: [birth year] Today's Date: 07/08/2023  History of Present Illness  Patient is a 73 year old female who presented with 2--3 day history of SOB. Patient was admitted with COPD exacerbation,acute on chronic hypoxic and hypercapnic respiratory failure. PMH; COPD, 4L/min at baseline, MCA infection chronic cavity lesions, bronchiectasis, HTN, esophageal dysmotility.  Clinical Impression      Pt admitted with above diagnosis.  Pt currently with functional limitations due to the deficits listed below (see PT Problem List). Pt in bed when therapist arrived. Pt agreeable to therapy eval. Pt reported 4 L/min PLOF and pt on 4 L/min supplemental O2 throughout session. Pt required min cues, use of hospital bed and S for supine to sit, CGA and cues for sit to stand  from EOB, gait tasks 50 feet with RW, CGA and cues for pursed lip breathing. Pt desaturated to 82% on 4 L/min with exertion and required close to 3 mins to recover seated to 90% with supplemental O2 of 4 L/min. Pt encouraged to sit up in recliner and all needs in place. Pt will benefit from continued therapy services in home setting. Pt will benefit from acute skilled PT to increase their independence and safety with mobility to allow discharge.       If plan is discharge home, recommend the following: A little help with walking and/or transfers;A little help with bathing/dressing/bathroom;Assistance with cooking/housework;Assist for transportation   Can travel by private vehicle        Equipment Recommendations None recommended by PT  Recommendations for Other Services       Functional Status Assessment Patient has had a recent decline in their functional status and demonstrates the ability to make significant improvements in function in a reasonable and predictable amount of time.     Precautions / Restrictions Precautions Precautions:  Fall Precaution/Restrictions Comments: monitor O2 on 4L/min at baseline Restrictions Weight Bearing Restrictions Per Provider Order: No      Mobility  Bed Mobility Overal bed mobility: Needs Assistance Bed Mobility: Supine to Sit     Supine to sit: Supervision     General bed mobility comments: min cues    Transfers Overall transfer level: Needs assistance Equipment used: Rolling walker (2 wheels) Transfers: Sit to/from Stand Sit to Stand: Contact guard assist           General transfer comment: min cues    Ambulation/Gait Ambulation/Gait assistance: Contact guard assist Gait Distance (Feet): 50 Feet Assistive device: Rolling walker (2 wheels) Gait Pattern/deviations: Step-through pattern Gait velocity: decreased     General Gait Details: min cues for safety and RW management as well as pursed lip breathing pt on 4 L/min during ambulation and desaturated to 82% and required seated theraputic rest break and cues for pursed lip breathing with 2:56 to recover to 90% pt indicated feeling slightly SOB and mild dizziness  Stairs            Wheelchair Mobility     Tilt Bed    Modified Rankin (Stroke Patients Only)       Balance Overall balance assessment: Mild deficits observed, not formally tested                                           Pertinent Vitals/Pain Pain Assessment Pain Assessment: No/denies pain    Home Living Family/patient expects  to be discharged to:: Private residence Living Arrangements: Children Available Help at Discharge: Family;Available PRN/intermittently Type of Home: House Home Access: Stairs to enter   Entrance Stairs-Number of Steps: 2-3   Home Layout: One level Home Equipment: Agricultural consultant (2 wheels);Cane - single point;Tub bench      Prior Function Prior Level of Function : Independent/Modified Independent               ADLs Comments: son helps with IADLs.     Extremity/Trunk  Assessment   Upper Extremity Assessment Upper Extremity Assessment: Overall WFL for tasks assessed    Lower Extremity Assessment Lower Extremity Assessment: Generalized weakness    Cervical / Trunk Assessment Cervical / Trunk Assessment: Normal  Communication   Communication Communication: No apparent difficulties    Cognition Arousal: Alert Behavior During Therapy: WFL for tasks assessed/performed                             Following commands: Intact       Cueing       General Comments      Exercises     Assessment/Plan    PT Assessment Patient needs continued PT services  PT Problem List Decreased strength;Decreased activity tolerance;Decreased balance;Decreased mobility;Cardiopulmonary status limiting activity       PT Treatment Interventions DME instruction;Gait training;Functional mobility training;Therapeutic activities;Therapeutic exercise;Balance training;Neuromuscular re-education;Patient/family education    PT Goals (Current goals can be found in the Care Plan section)  Acute Rehab PT Goals Patient Stated Goal: to be less dormit at home PT Goal Formulation: With patient Time For Goal Achievement: 07/22/23 Potential to Achieve Goals: Good    Frequency Min 3X/week     Co-evaluation PT/OT/SLP Co-Evaluation/Treatment: Yes Reason for Co-Treatment: Complexity of the patient's impairments (multi-system involvement);Necessary to address cognition/behavior during functional activity PT goals addressed during session: Mobility/safety with mobility;Balance OT goals addressed during session: ADL's and self-care;Proper use of Adaptive equipment and DME       AM-PAC PT 6 Clicks Mobility  Outcome Measure Help needed turning from your back to your side while in a flat bed without using bedrails?: None Help needed moving from lying on your back to sitting on the side of a flat bed without using bedrails?: A Little Help needed moving to and from  a bed to a chair (including a wheelchair)?: A Little Help needed standing up from a chair using your arms (e.g., wheelchair or bedside chair)?: A Little Help needed to walk in hospital room?: A Little Help needed climbing 3-5 steps with a railing? : A Lot 6 Click Score: 18    End of Session Equipment Utilized During Treatment: Gait belt;Oxygen Activity Tolerance: Treatment limited secondary to medical complications (Comment);Patient limited by fatigue (desaturation) Patient left: in chair;with call bell/phone within reach;with chair alarm set Nurse Communication: Mobility status PT Visit Diagnosis: Unsteadiness on feet (R26.81);Other abnormalities of gait and mobility (R26.89);Muscle weakness (generalized) (M62.81);Difficulty in walking, not elsewhere classified (R26.2)    Time: 8295-6213 PT Time Calculation (min) (ACUTE ONLY): 20 min   Charges:   PT Evaluation $PT Eval Low Complexity: 1 Low   PT General Charges $$ ACUTE PT VISIT: 1 Visit         Cary Clarks, PT Acute Rehab   Annalee Kiang 07/08/2023, 10:54 AM

## 2023-07-09 DIAGNOSIS — J9621 Acute and chronic respiratory failure with hypoxia: Secondary | ICD-10-CM | POA: Diagnosis not present

## 2023-07-09 DIAGNOSIS — J9622 Acute and chronic respiratory failure with hypercapnia: Secondary | ICD-10-CM | POA: Diagnosis not present

## 2023-07-09 LAB — CBC
HCT: 34.7 % — ABNORMAL LOW (ref 36.0–46.0)
Hemoglobin: 10.8 g/dL — ABNORMAL LOW (ref 12.0–15.0)
MCH: 30.7 pg (ref 26.0–34.0)
MCHC: 31.1 g/dL (ref 30.0–36.0)
MCV: 98.6 fL (ref 80.0–100.0)
Platelets: 197 10*3/uL (ref 150–400)
RBC: 3.52 MIL/uL — ABNORMAL LOW (ref 3.87–5.11)
RDW: 11.9 % (ref 11.5–15.5)
WBC: 7.9 10*3/uL (ref 4.0–10.5)
nRBC: 0 % (ref 0.0–0.2)

## 2023-07-09 LAB — BASIC METABOLIC PANEL WITH GFR
Anion gap: 10 (ref 5–15)
BUN: 26 mg/dL — ABNORMAL HIGH (ref 8–23)
CO2: 33 mmol/L — ABNORMAL HIGH (ref 22–32)
Calcium: 8.8 mg/dL — ABNORMAL LOW (ref 8.9–10.3)
Chloride: 95 mmol/L — ABNORMAL LOW (ref 98–111)
Creatinine, Ser: 0.8 mg/dL (ref 0.44–1.00)
GFR, Estimated: >60 mL/min (ref >60)
Glucose, Bld: 174 mg/dL — ABNORMAL HIGH (ref 70–99)
Potassium: 4.8 mmol/L (ref 3.5–5.1)
Sodium: 138 mmol/L (ref 135–145)

## 2023-07-09 LAB — MAGNESIUM: Magnesium: 2.2 mg/dL (ref 1.7–2.4)

## 2023-07-09 LAB — CULTURE, RESPIRATORY W GRAM STAIN: Gram Stain: NONE SEEN

## 2023-07-09 NOTE — Plan of Care (Signed)
  Problem: Education: Goal: Knowledge of General Education information will improve Outcome: Progressing  Outcome: Progressing Problem: Clinical Measurements: Goal: Cardiovascular complication will be avoided Outcome: Progressing   Problem: Activity: Goal: Risk for activity intolerance will decrease Outcome: Progressing   Problem: Coping: Goal: Level of anxiety will decrease Outcome: Progressing   Problem: Elimination: Goal: Will not experience complications related to bowel motility Outcome: Progressing Goal: Will not experience complications related to urinary retention Outcome: Progressing   Problem: Pain Managment: Goal: General experience of comfort will improve and/or be controlled Outcome: Progressing   Problem: Safety: Goal: Ability to remain free from injury will improve Outcome: Progressing   Problem: Skin Integrity: Goal: Risk for impaired skin integrity will decrease Outcome: Progressing

## 2023-07-09 NOTE — Plan of Care (Signed)

## 2023-07-09 NOTE — Progress Notes (Signed)
 PROGRESS NOTE  Kristy Weeks ZOX:[id number] DOB: December 25, 1950 DOA: 07/07/2023 PCP: Podraza, Cole Christopher, PA-C   LOS: 2 days   Brief narrative:    Kristy Weeks is a 73 y.o. female with past medical history of of severe COPD and chronic hypoxic respiratory failure on 4L [place], prior MAC infection chronic cavitary lesions, bronchiectasis, CAD, hypertension, GERD, mood d/o, wife transferred from [facility] ED for acute on chronic hypoxic respiratory failure and COPD exacerbation initially requiring BiPAP.   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 On home 4L O2 initial O2 sat 76%, placed on BiPAP.   Continue Solu-Medrol , Rocephin  and Zithromax , saline, nebs budesonide  albuterol  incentive spirometry flutter valve.  Would benefit from pulmonary rehab as outpatient.  Respiratory viral panel negative.  Follow sputum cultures.  No leukocytosis   Elevated proBNP without signs of HF  Check 2D echocardiogram.   CAD: Contiue home plavix .  Continue Lipitor.  HTN: Continue home coreg , Lisinopril    Esophageal dymotility / GERD: continue home famotidine   Mood disorder on as needed Xanax .  Debility deconditioning.  PT recommends home health PT.    DVT prophylaxis: enoxaparin  (LOVENOX ) injection 40 mg Start: 07/08/23 1000   Disposition: Home likely in 1 to 2 days  Status is: Inpatient Remains inpatient appropriate because: Pending clinical improvement    Code Status:     Code Status:  Full Code  Family Communication: None at bedside  Consultants: None  Procedures: None  Anti-infectives:  Rocephin  and Zithromax   Anti-infectives (From admission, onward)    Start     Dose/Rate Route Frequency Ordered Stop   07/08/23 0900  azithromycin  (ZITHROMAX ) tablet 500 mg        500 mg Oral Once per day on Monday Wednesday Friday 07/08/23 0041     07/08/23 0130  cefTRIAXone  (ROCEPHIN ) 1 g in sodium chloride  0.9 % 100 mL IVPB        1 g 200 mL/hr over 30 Minutes Intravenous Every 24 hours 07/08/23 0040 07/13/23 0129   07/07/23 2045  azithromycin  (ZITHROMAX ) 500 mg in sodium chloride  0.9 % 250 mL IVPB        500 mg 250 mL/hr over 60 Minutes Intravenous  Once 07/07/23 2030 07/07/23 2321        Subjective: Today, patient was seen and examined at bedside.  Patient denies any nausea, vomiting, fever, chills or rigor.  Feels better than yesterday in terms of breathing.  Objective: Vitals:   07/09/23 1000 07/09/23 1050  BP: 135/75   Pulse: 74   Resp: (!) 21   Temp:  (!) 97.5 F (36.4 C)  SpO2: 100%     Intake/Output Summary (Last 24 hours) at 07/09/2023 1226 Last data filed at 07/09/2023 0205 Gross per 24 hour  Intake 220 ml  Output 400 ml  Net -180 ml   Filed Weights   07/07/23 1919  Weight: 49 kg   Body mass index is 18.54 kg/m.   Physical Exam:  GENERAL: Patient is alert awake and oriented. Not in obvious distress. HENT: No scleral pallor or icterus. Pupils equally reactive to light.  Oral mucosa is moist NECK: is supple, no gross swelling noted. CHEST: Clear to auscultation. No crackles or wheezes.  Diminished breath sounds bilaterally. CVS: S1 and S2 heard, no murmur. Regular rate and rhythm.  ABDOMEN: Soft, non-tender, bowel sounds are present. EXTREMITIES: No edema. CNS: Cranial nerves are intact. No focal motor deficits. SKIN: warm and dry without rashes.  Data Review: I have personally reviewed the following laboratory data and  studies,  CBC: Recent Labs  Lab 07/07/23 1942 07/07/23 1956 07/08/23 0307 07/09/23 0308  WBC 7.0  --  4.4 7.9  NEUTROABS 5.6  --   --   --   HGB 12.2 12.9 11.2* 10.8*  HCT 38.8 38.0 36.3 34.7*  MCV 96.0  --  98.6 98.6  PLT 243  --  198 197   Basic Metabolic Panel: Recent Labs  Lab 07/07/23 1942 07/07/23 1956 07/08/23 0307 07/09/23 0308  NA 139 138 138 138  K 4.4 4.2 4.0 4.8  CL 97*  --  95* 95*  CO2 33*  --  33* 33*  GLUCOSE 189*  --  198* 174*  BUN 24*  --  24* 26*  CREATININE 0.73  --  0.63 0.80  CALCIUM  9.7  --  8.9 8.8*  MG  --   --  2.0 2.2  PHOS  --   --  3.0  --    Liver Function Tests: No results for input(s): AST, ALT, ALKPHOS, BILITOT, PROT, ALBUMIN in the last 168 hours. No results for input(s): LIPASE, AMYLASE in the last 168 hours. No results for input(s): AMMONIA in the last 168 hours. Cardiac Enzymes: No results for input(s): CKTOTAL, CKMB, CKMBINDEX, TROPONINI in the last 168 hours. BNP (last 3 results) No results for input(s): BNP in the last 8760 hours.  ProBNP (last 3 results) Recent Labs    07/07/23 1942  PROBNP 655.0*    CBG: Recent Labs  Lab 07/08/23 0004  GLUCAP 149*   Recent Results (from the past 240 hours)  MRSA Next Gen by PCR, Nasal     Status: None   Collection Time: 07/07/23 11:48 PM   Specimen: Nasal Mucosa; Nasal Swab  Result Value Ref Range Status   MRSA by PCR Next Gen NOT DETECTED NOT DETECTED Final    Comment: (NOTE) The GeneXpert MRSA Assay (FDA approved for NASAL specimens only), is one component of a comprehensive MRSA colonization surveillance program. It is not intended to diagnose MRSA infection nor to guide or monitor treatment for MRSA infections. Test performance is not FDA approved in patients less than 30 years old. Performed at [facility], [address] [place], [place] [address]   Respiratory (~20 pathogens) panel by PCR     Status: None    Collection Time: 07/08/23 12:38 AM   Specimen: Nasopharyngeal Swab; Respiratory  Result Value Ref Range Status   Adenovirus NOT DETECTED NOT DETECTED Final   Coronavirus 229E NOT DETECTED NOT DETECTED Final    Comment: (NOTE) The Coronavirus on the Respiratory Panel, DOES NOT test for the novel  Coronavirus (2019 nCoV)    Coronavirus HKU1 NOT DETECTED NOT DETECTED Final   Coronavirus NL63 NOT DETECTED NOT DETECTED Final   Coronavirus OC43 NOT DETECTED NOT DETECTED Final   Metapneumovirus NOT DETECTED NOT DETECTED Final   Rhinovirus / Enterovirus NOT DETECTED NOT DETECTED Final   Influenza A NOT DETECTED NOT DETECTED Final   Influenza B NOT DETECTED NOT DETECTED Final   Parainfluenza Virus 1 NOT DETECTED NOT  DETECTED Final   Parainfluenza Virus 2 NOT DETECTED NOT DETECTED Final   Parainfluenza Virus 3 NOT DETECTED NOT DETECTED Final   Parainfluenza Virus 4 NOT DETECTED NOT DETECTED Final   Respiratory Syncytial Virus NOT DETECTED NOT DETECTED Final   Bordetella pertussis NOT DETECTED NOT DETECTED Final   Bordetella Parapertussis NOT DETECTED NOT DETECTED Final   Chlamydophila pneumoniae NOT DETECTED NOT DETECTED Final   Mycoplasma pneumoniae NOT DETECTED NOT DETECTED Final    Comment: Performed at [facility], [address] [place], [place] [address]  SARS Coronavirus 2 by RT PCR (hospital order, performed in [facility] hospital lab) *cepheid single result test* Anterior Nasal Swab     Status: None   Collection Time: 07/08/23 12:39 AM   Specimen: Anterior Nasal Swab  Result Value Ref Range Status   SARS Coronavirus 2 by RT PCR NEGATIVE NEGATIVE Final    Comment: (NOTE) SARS-CoV-2 target nucleic acids are NOT DETECTED.  The SARS-CoV-2 RNA is generally detectable in upper and lower respiratory specimens during the acute phase of infection. The lowest concentration of SARS-CoV-2 viral copies this assay can detect is 250 copies / mL. A negative result does not preclude  SARS-CoV-2 infection and should not be used as the sole basis for treatment or other patient management decisions.  A negative result may occur with improper specimen collection / handling, submission of specimen other than nasopharyngeal swab, presence of viral mutation(s) within the areas targeted by this assay, and inadequate number of viral copies (<250 copies / mL). A negative result must be combined with clinical observations, patient history, and epidemiological information.  Fact Sheet for Patients:   RoadLapTop.co.za  Fact Sheet for Healthcare Providers: http://kim-miller.com/  This test is not yet approved or  cleared by the [place]  FDA and has been authorized for detection and/or diagnosis of SARS-CoV-2 by FDA under an Emergency Use Authorization (EUA).  This EUA will remain in effect (meaning this test can be used) for the duration of the COVID-19 declaration under Section 564(b)(1) of the Act, 21 U.S.C. section 360bbb-3(b)(1), unless the authorization is terminated or revoked sooner.  Performed at [facility], [address], [place], [place] [address]   Expectorated Sputum Assessment w Gram Stain, Rflx to Resp Cult     Status: None   Collection Time: 07/08/23  2:01 AM   Specimen: Expectorated Sputum  Result Value Ref Range Status   Specimen Description EXPECTORATED SPUTUM  Final   Special Requests NONE  Final   Sputum evaluation   Final    THIS SPECIMEN IS ACCEPTABLE FOR SPUTUM CULTURE Performed at [facility], [address] [place], [place] [address]    Report Status 07/08/2023 FINAL  Final  Culture, Respiratory w Gram Stain     Status: None (Preliminary result)   Collection Time: 07/08/23  2:01 AM  Result Value Ref Range Status   Specimen Description   Final    EXPECTORATED SPUTUM Performed at [facility], [address] [place], [place]  [address]    Special Requests   Final    NONE Reflexed from [phone] Performed at [facility], [address] [place], [place] [address]    Gram Stain NO WBC SEEN NO ORGANISMS SEEN   Final   Culture   Final    CULTURE REINCUBATED FOR BETTER GROWTH Performed at [facility], [address] [place], [place] [address]    Report Status PENDING  Incomplete     Studies: ECHOCARDIOGRAM COMPLETE Result Date: 07/08/2023    ECHOCARDIOGRAM REPORT   Patient Name:   Kristy Weeks Date of Exam: 07/08/2023 Medical Rec #:  [id number]        Height:       64.0 in Accession #:    [id number]       Weight:       108.0 lb Date of Birth:  05/07/1950       BSA:          1.506 m Patient Age:    72 years         BP:           85/59 mmHg Patient Gender: F                HR:           85 bpm. Exam Location:  Inpatient Procedure: 2D Echo, Cardiac Doppler and Color Doppler (Both Spectral and Color            Flow Doppler were utilized during procedure). Indications:    Dyspnea R06.00  History:        Patient has prior history of Echocardiogram examinations, most                 recent 09/11/2021. CAD, COPD, Signs/Symptoms:Dyspnea; Risk                 Factors:Hypertension. WFB: technically challenging due to COPD.                 LVEF 55-60%, basal inferior aneurysm. RV function                 mild-moderately reduced. Mild-moderate AR, mild TR, RVSP 43                 mmHg.  Sonographer:    Astrid Blamer Referring Phys: [id number] [facility] Roni Friberg IMPRESSIONS  1. Left ventricular ejection fraction, by estimation, is 50 to 55%. The left ventricle has low normal function. The left ventricle demonstrates regional wall motion abnormalities (see scoring diagram/findings for description). Left ventricular diastolic  parameters are consistent with Grade II diastolic dysfunction (pseudonormalization). There is akinesis of the left ventricular, basal inferior wall.  2. Right ventricular systolic function is normal.  The right ventricular size is normal. There is mildly elevated pulmonary artery systolic pressure.  3. Right atrial size was mildly dilated.  4. The mitral valve is grossly normal. Trivial mitral valve regurgitation. No evidence of mitral stenosis.  5. The aortic valve was not well visualized. There is moderate calcification of the aortic valve. Aortic valve regurgitation is mild. Aortic valve sclerosis/calcification is present, without any evidence of aortic stenosis.  6. The inferior vena cava is dilated in size with >50% respiratory variability, suggesting right atrial pressure of 8 mmHg. Comparison(s): Prior images unable to be directly viewed, comparison made by report only. No significant change from prior study. Conclusion(s)/Recommendation(s): Challenging images due to lung disease. No significant change noted when compared to [facility] report from 2023. FINDINGS  Left Ventricle: Left ventricular ejection fraction, by estimation, is 50 to 55%. The left ventricle has low normal function. The left ventricle demonstrates regional wall motion abnormalities. The left ventricular internal cavity size was normal in size. There is no left ventricular hypertrophy. Left ventricular diastolic parameters are consistent with Grade II diastolic dysfunction (pseudonormalization). Right Ventricle: The right ventricular size is normal. No increase in right ventricular wall thickness. Right ventricular systolic function  is normal. There is mildly elevated pulmonary artery systolic pressure. The tricuspid regurgitant velocity is 2.88  m/s, and with an assumed right atrial pressure of 8 mmHg, the estimated right ventricular systolic pressure is 41.2 mmHg. Left Atrium: Left atrial size was normal in size. Right Atrium: Right atrial size was mildly dilated. Pericardium: There is no evidence of pericardial effusion. Mitral Valve: The mitral valve is grossly normal. Trivial mitral valve regurgitation. No evidence of mitral valve  stenosis. Tricuspid Valve: The tricuspid valve is grossly normal. Tricuspid valve regurgitation is mild . No evidence of tricuspid stenosis. Aortic Valve: The aortic valve was not well visualized. There is moderate calcification of the aortic valve. Aortic valve regurgitation is mild. Aortic regurgitation PHT measures 559 msec. Aortic valve sclerosis/calcification is present, without any evidence of aortic stenosis. Aortic valve mean gradient measures 8.0 mmHg. Aortic valve peak gradient measures 12.4 mmHg. Aortic valve area, by VTI measures 1.90 cm. Pulmonic Valve: The pulmonic valve was not well visualized. Pulmonic valve regurgitation is not visualized. No evidence of pulmonic stenosis. Aorta: The aortic root was not well visualized, the ascending aorta was not well visualized and the aortic arch was not well visualized. Venous: The inferior vena cava is dilated in size with greater than 50% respiratory variability, suggesting right atrial pressure of 8 mmHg. IAS/Shunts: The atrial septum is grossly normal.  LEFT VENTRICLE PLAX 2D LVIDd:         4.30 cm   Diastology LVIDs:         3.50 cm   LV e' medial:    4.68 cm/s LV PW:         0.90 cm   LV E/e' medial:  18.9 LV IVS:        1.00 cm   LV e' lateral:   8.27 cm/s LVOT diam:     1.70 cm   LV E/e' lateral: 10.7 LV SV:         62 LV SV Index:   41 LVOT Area:     2.27 cm  RIGHT VENTRICLE RV S prime:     15.90 cm/s TAPSE (M-mode): 2.2 cm LEFT ATRIUM             Index        RIGHT ATRIUM           Index LA Vol (A2C):   27.0 ml 17.93 ml/m  RA Area:     16.30 cm LA Vol (A4C):   16.8 ml 11.16 ml/m  RA Volume:   45.40 ml  30.15 ml/m LA Biplane Vol: 21.2 ml 14.08 ml/m  AORTIC VALVE AV Area (Vmax):    1.61 cm AV Area (Vmean):   1.52 cm AV Area (VTI):     1.90 cm AV Vmax:           176.00 cm/s AV Vmean:          138.000 cm/s AV VTI:            0.329 m AV Peak Grad:      12.4 mmHg AV Mean Grad:      8.0 mmHg LVOT Vmax:         125.00 cm/s LVOT Vmean:        92.700  cm/s LVOT VTI:          0.275 m LVOT/AV VTI ratio: 0.84 AI PHT:            559 msec MITRAL VALVE  TRICUSPID VALVE MV Area (PHT): 2.89 cm     TR Peak grad:   33.2 mmHg MV E velocity: 88.30 cm/s   TR Vmax:        288.00 cm/s MV A velocity: 104.00 cm/s MV E/A ratio:  0.85         SHUNTS                             Systemic VTI:  0.28 m                             Systemic Diam: 1.70 cm Sheryle Donning MD Electronically signed by Sheryle Donning MD Signature Date/Time: 07/08/2023/8:10:20 PM    Final    CT Chest W Contrast Result Date: 07/07/2023 CLINICAL DATA:  Abnormal chest x-ray.  COPD exacerbation. EXAM: CT CHEST WITH CONTRAST TECHNIQUE: Multidetector CT imaging of the chest was performed during intravenous contrast administration. RADIATION DOSE REDUCTION: This exam was performed according to the departmental dose-optimization program which includes automated exposure control, adjustment of the mA and/or kV according to patient size and/or use of iterative reconstruction technique. CONTRAST:  75mL OMNIPAQUE  IOHEXOL  300 MG/ML  SOLN COMPARISON:  Chest x-ray today.  CT 03/16/2023. FINDINGS: Cardiovascular: Heart heart is normal size. Three-vessel coronary artery disease. Tortuous thoracic aorta without aneurysm or dissection. Irregular calcified and noncalcified plaque throughout the descending thoracic aorta. Mediastinum/Nodes: No mediastinal, hilar, or axillary adenopathy. Trachea and esophagus are unremarkable. Thyroid unremarkable. Lungs/Pleura: Severe centrilobular emphysema. Large chronic right upper lobe cavity noted with air-fluid level. Appearance is unchanged since prior study. Bronchiectasis and mucous plugging in the lower lobes bilaterally, right worse than left. Irregular area consolidation again noted in the right lower lobe unchanged. 2 cm cavitary nodule in the inferior right lower lobe on image 116, also stable. No new or enlarging nodules. No effusions. Upper Abdomen:  Layering gallstone within the gallbladder. No acute findings. Musculoskeletal: Chest wall soft tissues are unremarkable. No acute bony abnormality. IMPRESSION: Extensive chronic changes throughout the right lung including large cavity in the right upper lobe, chronic airspace opacity in the right lower lobe with additional cavitary area in the right lower lobe, bronchiectasis and mucous plugging throughout the lower lobes bilaterally, right greater than left. Findings are stable since prior study and are most compatible with chronic MAI. No new or enlarging pulmonary nodule. No acute pulmonary infiltrate. Coronary artery disease. Aortic Atherosclerosis (ICD10-I70.0) and Emphysema (ICD10-J43.9). Electronically Signed   By: Janeece Mechanic M.D.   On: 07/07/2023 21:16   DG Chest Portable 1 View Result Date: 07/07/2023 CLINICAL DATA:  COPD exacerbation gradually worsening EXAM: PORTABLE CHEST 1 VIEW COMPARISON:  Radiograph 06/11/2023 FINDINGS: Stable cardiac silhouette including ectasias of the thoracic aorta. Aortic atherosclerotic calcification. Large right apical cavitation containing fluid and gas is redemonstrated. Emphysema with bullous change in the left upper lobe. Chronic bronchitic change in the lower lobes. Small right pleural effusion or pleural thickening. Nodular consolidation in the right lower lung. No displaced rib fracture. IMPRESSION: 1. New nodular consolidation in the right lower lung. CT is recommended for further evaluation. 2. Similar large right apical cavitation containing fluid and gas. 3. Small right pleural effusion or pleural thickening. 4. Bullous emphysema. Electronically Signed   By: Rozell Cornet M.D.   On: 07/07/2023 19:53      Rosena Conradi, MD  [facility] 07/09/2023  If 7PM-7AM, please contact night-coverage

## 2023-07-10 DIAGNOSIS — J9621 Acute and chronic respiratory failure with hypoxia: Secondary | ICD-10-CM | POA: Diagnosis not present

## 2023-07-10 DIAGNOSIS — J9622 Acute and chronic respiratory failure with hypercapnia: Secondary | ICD-10-CM | POA: Diagnosis not present

## 2023-07-10 MED ORDER — PREDNISONE 5 MG PO TABS: 5.0000 mg | ORAL_TABLET | ORAL | Status: DC

## 2023-07-10 MED ORDER — AMOXICILLIN-POT CLAVULANATE 875-125 MG PO TABS: 1.0000 | ORAL_TABLET | Freq: Two times a day (BID) | ORAL | 0 refills | Status: DC

## 2023-07-10 MED ORDER — PREDNISONE 10 MG PO TABS: ORAL_TABLET | ORAL | 0 refills | Status: DC

## 2023-07-10 NOTE — TOC Transition Note (Signed)
 Transition of Care [facility]) - Discharge Note   Patient Details  Name: Kristy Weeks MRN: [id number] Date of Birth: December 30, 1950  [facility] CM/SW Contact:  Marty Sleet, LCSW Phone Number: 07/10/2023, 9:02 AM   Clinical Narrative:    Pt to return home with son. Pt will be receiving HHPT services through [facility]. HH orders are in place. Pt has portable oxygen concentrator with her for use during transport home. No further TOC needs identified. TOC signing off.    Final next level of care: Home w Home Health Services Barriers to Discharge: Barriers Resolved   Patient Goals and CMS Choice Patient states their goals for this hospitalization and ongoing recovery are:: To return home CMS Medicare.gov Compare Post Acute Care list provided to:: Patient Choice offered to / list presented to : Patient [place] ownership interest in [facility].provided to:: Patient    Discharge Placement                       Discharge Plan and Services Additional resources added to the After Visit Summary for   In-house Referral: NA Discharge Planning Services: CM Consult Post Acute Care Choice: Home Health          DME Arranged: N/A DME Agency: NA       HH Arranged: PT HH Agency: Other - See comment ([facility]) Date HH Agency Contacted: 07/08/23 Time HH Agency Contacted: 1407 Representative spoke with at [facility] Agency: Senaida Dama with [facility]  Social Drivers of Health (SDOH) Interventions SDOH Screenings   Food Insecurity: Unknown (07/08/2023)  Housing: Low Risk  (07/08/2023)  Transportation Needs: No Transportation Needs (07/08/2023)  Utilities: Not At Risk (07/08/2023)  Social Connections: Unknown (07/08/2023)  Tobacco Use: Medium Risk (07/07/2023)     Readmission Risk Interventions    07/08/2023    2:02 PM 02/19/2023    1:48 PM  Readmission Risk Prevention Plan  Post Dischage Appt  Complete  Medication Screening  Complete  Transportation  Screening Complete Complete  PCP or Specialist Appt within 5-7 Days Complete   Home Care Screening Complete   Medication Review (RN CM) Complete

## 2023-07-10 NOTE — Discharge Summary (Signed)
 Physician Discharge Summary  Brooke Steinhilber MVH:[id number] DOB: April 08, 1950 DOA: 07/07/2023  PCP: Podraza, Cole Christopher, PA-C  Admit date: 07/07/2023 Discharge date: 07/10/2023  Admitted From: Home  Discharge disposition: Home with home health  Recommendations for Outpatient Follow-Up:   Follow up with your primary care provider in one week.  Check CBC, BMP, magnesium  in the next visit Follow-up with your pulmonary physician in 3 weeks as has been scheduled.   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  Diet recommendation:   Regular.  Wound care: None.  Code status: Full.   History of Present Illness:   Kristy Weeks is a 73 y.o. female with past medical history of of severe COPD and chronic hypoxic respiratory failure on 4L [place], prior MAC infection chronic cavitary lesions, bronchiectasis, CAD, hypertension, GERD, mood d/o, wife transferred from [facility] ED for acute on chronic hypoxic respiratory failure and COPD exacerbation initially requiring BiPAP.   Hospital Course:   Following conditions were addressed during hospitalization as listed below,   COPD exacerbation, severe, initially requiring BiPAP  Acute on chronic hypoxic and hypercapneic respiratory failure  Hx pulmonary MAC, with cavitary lesions, bronchiectasis and mucous plugging;  Presented with acute shortness of breath, dyspnea and difficulty clearing secretions. On home 4L O2 initial O2 sat 76%, initially was placed on  BiPAP.   Received Solu-Medrol , Rocephin  and Zithromax , saline, nebs budesonide  albuterol  incentive spirometry flutter valve.  Follows up with pulmonary with [facility] and has an appointment in 3 weeks.  Encouraged to  keep that appointment.  Advised about the oxygen uses during ambulation.  Discussed strategies to improve breathing.  Respiratory viral panel negative.  Follow sputum cultures.  No leukocytosis will consider prednisone  taper on discharge.  Has prednisone  at home at baseline.  Will continue course of Augmentin and discharge   Elevated proBNP without signs of HF  2D echocardiogram with LV ejection fraction of 50 to 55% with grade 2 diastolic dysfunction.  No signs of heart failure at this time.   CAD: Contiue home plavix .  Continue Lipitor.   HTN: Continue home coreg , Lisinopril     Esophageal dymotility / GERD: continue home famotidine    Mood disorder on as needed Xanax .   Debility deconditioning.  PT recommends home health PT.  Disposition.  At this time, patient is stable for disposition home with outpatient PCP and pulmonary follow-up.  Medical Consultants:   None.  Procedures:    BiPAP   Subjective:   Today, patient was seen and examined at bedside.  Feels better.  Has less wheezing or cough.  Discussed about the use of oxygen while ambulating.  Discharge Exam:   Vitals:   07/10/23 0749 07/10/23 0752  BP:    Pulse:    Resp:    Temp:    SpO2: 99% 99%   Vitals:   07/10/23 0151 07/10/23 0436 07/10/23 0749 07/10/23 0752  BP: (!) 153/91 121/63    Pulse: 76 (!) 57    Resp: 16 15    Temp: 98.3 F (36.8 C) 98 F (36.7 C)    TempSrc:      SpO2: 95% 100% 99% 99%  Weight:      Height:        General: Alert awake, not in obvious distress,  mildly anxious, on nasal cannula oxygen at baseline HENT: pupils equally reacting to light,  No scleral pallor or icterus noted. Oral mucosa is moist.  Chest:   Diminished breath sounds bilaterally.  No wheezing. CVS: S1 &S2 heard. No murmur.  Regular rate and rhythm. Abdomen: Soft, nontender, nondistended.  Bowel sounds are heard.   Extremities: No cyanosis, clubbing or edema.  Peripheral pulses are palpable. Psych: Alert, awake  and oriented, normal mood CNS:  No cranial nerve deficits.  Power equal in all extremities.   Skin: Warm and dry.  No rashes noted.  The results of significant diagnostics from this hospitalization (including imaging, microbiology, ancillary and laboratory) are listed below for reference.     Diagnostic Studies:   ECHOCARDIOGRAM COMPLETE Result Date: 07/08/2023    ECHOCARDIOGRAM REPORT   Patient Name:   Kristy Weeks Date of Exam: 07/08/2023 Medical Rec #:  [id number]        Height:       64.0 in Accession #:    [id number]       Weight:       108.0 lb Date of Birth:  01-19-1951       BSA:          1.506 m Patient Age:    72 years         BP:           85/59 mmHg Patient Gender: F                HR:           85 bpm. Exam Location:  Inpatient Procedure: 2D Echo, Cardiac Doppler and Color Doppler (Both Spectral and Color            Flow Doppler were utilized during procedure). Indications:    Dyspnea R06.00  History:        Patient has prior history of Echocardiogram examinations, most                 recent 09/11/2021. CAD, COPD, Signs/Symptoms:Dyspnea; Risk                 Factors:Hypertension. WFB: technically challenging due to COPD.                 LVEF 55-60%, basal inferior aneurysm. RV function                 mild-moderately reduced. Mild-moderate AR, mild TR, RVSP 43                 mmHg.  Sonographer:    Astrid Blamer Referring Phys: [id number] [facility] Irem Stoneham IMPRESSIONS  1. Left ventricular ejection fraction, by estimation, is 50 to 55%. The left ventricle has low normal function. The left ventricle demonstrates regional wall motion abnormalities (see scoring diagram/findings for description). Left ventricular diastolic  parameters are consistent with Grade II diastolic dysfunction (pseudonormalization). There is akinesis of the left ventricular, basal inferior wall.  2. Right ventricular systolic function is normal. The right ventricular size is normal. There is mildly elevated pulmonary artery  systolic pressure.  3. Right atrial size was mildly dilated.  4. The mitral valve is grossly normal. Trivial mitral valve regurgitation. No evidence of mitral stenosis.  5. The aortic valve was not well visualized. There is moderate calcification of the aortic valve. Aortic valve regurgitation is mild. Aortic valve sclerosis/calcification is present, without any evidence of aortic stenosis.  6. The inferior vena cava is dilated in size with >50% respiratory  variability, suggesting right atrial pressure of 8 mmHg. Comparison(s): Prior images unable to be directly viewed, comparison made by report only. No significant change from prior study. Conclusion(s)/Recommendation(s): Challenging images due to lung disease. No significant change noted when compared to [facility] report from 2023. FINDINGS  Left Ventricle: Left ventricular ejection fraction, by estimation, is 50 to 55%. The left ventricle has low normal function. The left ventricle demonstrates regional wall motion abnormalities. The left ventricular internal cavity size was normal in size. There is no left ventricular hypertrophy. Left ventricular diastolic parameters are consistent with Grade II diastolic dysfunction (pseudonormalization). Right Ventricle: The right ventricular size is normal. No increase in right ventricular wall thickness. Right ventricular systolic function is normal. There is mildly elevated pulmonary artery systolic pressure. The tricuspid regurgitant velocity is 2.88  m/s, and with an assumed right atrial pressure of 8 mmHg, the estimated right ventricular systolic pressure is 41.2 mmHg. Left Atrium: Left atrial size was normal in size. Right Atrium: Right atrial size was mildly dilated. Pericardium: There is no evidence of pericardial effusion. Mitral Valve: The mitral valve is grossly normal. Trivial mitral valve regurgitation. No evidence of mitral valve stenosis. Tricuspid Valve: The tricuspid valve is grossly normal. Tricuspid valve  regurgitation is mild . No evidence of tricuspid stenosis. Aortic Valve: The aortic valve was not well visualized. There is moderate calcification of the aortic valve. Aortic valve regurgitation is mild. Aortic regurgitation PHT measures 559 msec. Aortic valve sclerosis/calcification is present, without any evidence of aortic stenosis. Aortic valve mean gradient measures 8.0 mmHg. Aortic valve peak gradient measures 12.4 mmHg. Aortic valve area, by VTI measures 1.90 cm. Pulmonic Valve: The pulmonic valve was not well visualized. Pulmonic valve regurgitation is not visualized. No evidence of pulmonic stenosis. Aorta: The aortic root was not well visualized, the ascending aorta was not well visualized and the aortic arch was not well visualized. Venous: The inferior vena cava is dilated in size with greater than 50% respiratory variability, suggesting right atrial pressure of 8 mmHg. IAS/Shunts: The atrial septum is grossly normal.  LEFT VENTRICLE PLAX 2D LVIDd:         4.30 cm   Diastology LVIDs:         3.50 cm   LV e' medial:    4.68 cm/s LV PW:         0.90 cm   LV E/e' medial:  18.9 LV IVS:        1.00 cm   LV e' lateral:   8.27 cm/s LVOT diam:     1.70 cm   LV E/e' lateral: 10.7 LV SV:         62 LV SV Index:   41 LVOT Area:     2.27 cm  RIGHT VENTRICLE RV S prime:     15.90 cm/s TAPSE (M-mode): 2.2 cm LEFT ATRIUM             Index        RIGHT ATRIUM           Index LA Vol (A2C):   27.0 ml 17.93 ml/m  RA Area:     16.30 cm LA Vol (A4C):   16.8 ml 11.16 ml/m  RA Volume:   45.40 ml  30.15 ml/m LA Biplane Vol: 21.2 ml 14.08 ml/m  AORTIC VALVE AV Area (Vmax):    1.61 cm AV Area (Vmean):   1.52 cm AV Area (VTI):     1.90 cm AV Vmax:  176.00 cm/s AV Vmean:          138.000 cm/s AV VTI:            0.329 m AV Peak Grad:      12.4 mmHg AV Mean Grad:      8.0 mmHg LVOT Vmax:         125.00 cm/s LVOT Vmean:        92.700 cm/s LVOT VTI:          0.275 m LVOT/AV VTI ratio: 0.84 AI PHT:            559 msec  MITRAL VALVE                TRICUSPID VALVE MV Area (PHT): 2.89 cm     TR Peak grad:   33.2 mmHg MV E velocity: 88.30 cm/s   TR Vmax:        288.00 cm/s MV A velocity: 104.00 cm/s MV E/A ratio:  0.85         SHUNTS                             Systemic VTI:  0.28 m                             Systemic Diam: 1.70 cm Sheryle Donning MD Electronically signed by Sheryle Donning MD Signature Date/Time: 07/08/2023/8:10:20 PM    Final    CT Chest W Contrast Result Date: 07/07/2023 CLINICAL DATA:  Abnormal chest x-ray.  COPD exacerbation. EXAM: CT CHEST WITH CONTRAST TECHNIQUE: Multidetector CT imaging of the chest was performed during intravenous contrast administration. RADIATION DOSE REDUCTION: This exam was performed according to the departmental dose-optimization program which includes automated exposure control, adjustment of the mA and/or kV according to patient size and/or use of iterative reconstruction technique. CONTRAST:  75mL OMNIPAQUE  IOHEXOL  300 MG/ML  SOLN COMPARISON:  Chest x-ray today.  CT 03/16/2023. FINDINGS: Cardiovascular: Heart heart is normal size. Three-vessel coronary artery disease. Tortuous thoracic aorta without aneurysm or dissection. Irregular calcified and noncalcified plaque throughout the descending thoracic aorta. Mediastinum/Nodes: No mediastinal, hilar, or axillary adenopathy. Trachea and esophagus are unremarkable. Thyroid unremarkable. Lungs/Pleura: Severe centrilobular emphysema. Large chronic right upper lobe cavity noted with air-fluid level. Appearance is unchanged since prior study. Bronchiectasis and mucous plugging in the lower lobes bilaterally, right worse than left. Irregular area consolidation again noted in the right lower lobe unchanged. 2 cm cavitary nodule in the inferior right lower lobe on image 116, also stable. No new or enlarging nodules. No effusions. Upper Abdomen: Layering gallstone within the gallbladder. No acute findings. Musculoskeletal: Chest  wall soft tissues are unremarkable. No acute bony abnormality. IMPRESSION: Extensive chronic changes throughout the right lung including large cavity in the right upper lobe, chronic airspace opacity in the right lower lobe with additional cavitary area in the right lower lobe, bronchiectasis and mucous plugging throughout the lower lobes bilaterally, right greater than left. Findings are stable since prior study and are most compatible with chronic MAI. No new or enlarging pulmonary nodule. No acute pulmonary infiltrate. Coronary artery disease. Aortic Atherosclerosis (ICD10-I70.0) and Emphysema (ICD10-J43.9). Electronically Signed   By: Janeece Mechanic M.D.   On: 07/07/2023 21:16   DG Chest Portable 1 View Result Date: 07/07/2023 CLINICAL DATA:  COPD exacerbation gradually worsening EXAM: PORTABLE CHEST 1 VIEW COMPARISON:  Radiograph 06/11/2023 FINDINGS: Stable cardiac silhouette  including ectasias of the thoracic aorta. Aortic atherosclerotic calcification. Large right apical cavitation containing fluid and gas is redemonstrated. Emphysema with bullous change in the left upper lobe. Chronic bronchitic change in the lower lobes. Small right pleural effusion or pleural thickening. Nodular consolidation in the right lower lung. No displaced rib fracture. IMPRESSION: 1. New nodular consolidation in the right lower lung. CT is recommended for further evaluation. 2. Similar large right apical cavitation containing fluid and gas. 3. Small right pleural effusion or pleural thickening. 4. Bullous emphysema. Electronically Signed   By: Rozell Cornet M.D.   On: 07/07/2023 19:53     Labs:   Basic Metabolic Panel: Recent Labs  Lab 07/07/23 1942 07/07/23 1956 07/08/23 0307 07/09/23 0308  NA 139 138 138 138  K 4.4 4.2 4.0 4.8  CL 97*  --  95* 95*  CO2 33*  --  33* 33*  GLUCOSE 189*  --  198* 174*  BUN 24*  --  24* 26*  CREATININE 0.73  --  0.63 0.80  CALCIUM  9.7  --  8.9 8.8*  MG  --   --  2.0 2.2  PHOS   --   --  3.0  --    GFR Estimated Creatinine Clearance: 47.7 mL/min (by C-G formula based on SCr of 0.8 mg/dL). Liver Function Tests: No results for input(s): AST, ALT, ALKPHOS, BILITOT, PROT, ALBUMIN in the last 168 hours. No results for input(s): LIPASE, AMYLASE in the last 168 hours. No results for input(s): AMMONIA in the last 168 hours. Coagulation profile No results for input(s): INR, PROTIME in the last 168 hours.  CBC: Recent Labs  Lab 07/07/23 1942 07/07/23 1956 07/08/23 0307 07/09/23 0308  WBC 7.0  --  4.4 7.9  NEUTROABS 5.6  --   --   --   HGB 12.2 12.9 11.2* 10.8*  HCT 38.8 38.0 36.3 34.7*  MCV 96.0  --  98.6 98.6  PLT 243  --  198 197   Cardiac Enzymes: No results for input(s): CKTOTAL, CKMB, CKMBINDEX, TROPONINI in the last 168 hours. BNP: Invalid input(s): POCBNP CBG: Recent Labs  Lab 07/08/23 0004  GLUCAP 149*   D-Dimer No results for input(s): DDIMER in the last 72 hours. Hgb A1c No results for input(s): HGBA1C in the last 72 hours. Lipid Profile No results for input(s): CHOL, HDL, LDLCALC, TRIG, CHOLHDL, LDLDIRECT in the last 72 hours. Thyroid function studies No results for input(s): TSH, T4TOTAL, T3FREE, THYROIDAB in the last 72 hours.  Invalid input(s): FREET3 Anemia work up No results for input(s): VITAMINB12, FOLATE, FERRITIN, TIBC, IRON, RETICCTPCT in the last 72 hours. Microbiology Recent Results (from the past 240 hours)  MRSA Next Gen by PCR, Nasal     Status: None   Collection Time: 07/07/23 11:48 PM   Specimen: Nasal Mucosa; Nasal Swab  Result Value Ref Range Status   MRSA by PCR Next Gen NOT DETECTED NOT DETECTED Final    Comment: (NOTE) The GeneXpert MRSA Assay (FDA approved for NASAL specimens only), is one component of a comprehensive MRSA colonization surveillance program. It is not intended to diagnose MRSA infection nor to guide or monitor treatment for  MRSA infections. Test performance is not FDA approved in patients less than 63 years old. Performed at [facility], [address], [place], [place] [address]   Respiratory (~20 pathogens) panel by PCR     Status: None   Collection Time: 07/08/23 12:38 AM   Specimen: Nasopharyngeal Swab; Respiratory  Result Value Ref Range Status   Adenovirus NOT DETECTED NOT DETECTED Final   Coronavirus 229E NOT DETECTED NOT DETECTED Final    Comment: (NOTE) The Coronavirus on the Respiratory Panel, DOES NOT test for the novel  Coronavirus (2019 nCoV)    Coronavirus HKU1 NOT DETECTED NOT DETECTED Final   Coronavirus NL63 NOT DETECTED NOT DETECTED Final   Coronavirus OC43 NOT DETECTED NOT DETECTED Final   Metapneumovirus NOT DETECTED NOT DETECTED Final   Rhinovirus / Enterovirus NOT DETECTED NOT DETECTED Final   Influenza A NOT DETECTED NOT DETECTED Final   Influenza B NOT DETECTED NOT DETECTED Final   Parainfluenza Virus 1 NOT DETECTED NOT DETECTED Final   Parainfluenza Virus 2 NOT DETECTED NOT DETECTED Final   Parainfluenza Virus 3 NOT DETECTED NOT DETECTED Final   Parainfluenza Virus 4 NOT DETECTED NOT DETECTED Final   Respiratory Syncytial Virus NOT DETECTED NOT DETECTED Final   Bordetella pertussis NOT DETECTED NOT DETECTED Final   Bordetella Parapertussis NOT DETECTED NOT DETECTED Final   Chlamydophila pneumoniae NOT DETECTED NOT DETECTED Final   Mycoplasma pneumoniae NOT DETECTED NOT DETECTED Final    Comment: Performed at [facility], [address] [place], [place] [address]  SARS Coronavirus 2 by RT PCR (hospital order, performed in [facility] hospital lab) *cepheid single result test* Anterior Nasal Swab     Status: None   Collection Time: 07/08/23 12:39 AM   Specimen: Anterior Nasal Swab  Result Value Ref Range Status   SARS Coronavirus 2 by RT PCR NEGATIVE NEGATIVE Final    Comment: (NOTE) SARS-CoV-2 target nucleic acids are NOT DETECTED.  The  SARS-CoV-2 RNA is generally detectable in upper and lower respiratory specimens during the acute phase of infection. The lowest concentration of SARS-CoV-2 viral copies this assay can detect is 250 copies / mL. A negative result does not preclude SARS-CoV-2 infection and should not be used as the sole basis for treatment or other patient management decisions.  A negative result may occur with improper specimen collection / handling, submission of specimen other than nasopharyngeal swab, presence of viral mutation(s) within the areas targeted by this assay, and inadequate number of viral copies (<250 copies / mL). A negative result must be combined with clinical observations, patient history, and epidemiological information.  Fact Sheet for Patients:   RoadLapTop.co.za  Fact Sheet for Healthcare Providers: http://kim-miller.com/  This test is not yet approved or  cleared by the [place]  FDA and has been authorized for detection and/or diagnosis of SARS-CoV-2 by FDA under an Emergency Use Authorization (EUA).  This EUA will remain in effect (meaning this test can be used) for the duration of the COVID-19 declaration under Section 564(b)(1) of the Act, 21 U.S.C. section 360bbb-3(b)(1), unless the authorization is terminated or revoked sooner.  Performed at [facility], [address] [place], [place] [address]   Expectorated Sputum Assessment w Gram Stain, Rflx to Resp Cult     Status: None   Collection Time: 07/08/23  2:01 AM   Specimen: Expectorated Sputum  Result Value Ref Range Status   Specimen Description EXPECTORATED SPUTUM  Final   Special Requests NONE  Final   Sputum evaluation   Final    THIS SPECIMEN IS ACCEPTABLE FOR SPUTUM CULTURE Performed at [facility], [address], [place], [place] [address]    Report Status 07/08/2023 FINAL  Final  Culture, Respiratory w Gram Stain      Status: None (Preliminary result)  Collection Time: 07/08/23  2:01 AM  Result Value Ref Range Status   Specimen Description   Final    EXPECTORATED SPUTUM Performed at [facility], [address], [place], [place] [address]    Special Requests   Final    NONE Reflexed from [id number] Performed at [facility], [address], [place], [place] [address]    Gram Stain NO WBC SEEN NO ORGANISMS SEEN   Final   Culture   Final    CULTURE REINCUBATED FOR BETTER GROWTH Performed at [facility], [address], [place], [place] [address]    Report Status PENDING  Incomplete     Discharge Instructions:   Discharge Instructions     Call MD for:  difficulty breathing, headache or visual disturbances   Complete by: As directed    Diet general   Complete by: As directed    Discharge instructions   Complete by: As directed    Follow-up with your primary care provider in 1 week.  Check blood work at that time.  Seek medical attention for worsening symptoms.  Complete the course of antibiotic. Follow up with your pulmonary physician in 3 weeks which has been scheduled. Complete prednisone  taper and then resume your home prednisone . Avoid overexertion, increase oxygen while walking or doing activity.   Increase activity slowly   Complete by: As directed       Allergies as of 07/10/2023       Reactions   Cefdinir Other (See Comments)   Abdominal pain   Hydrocodone -acetaminophen  Other (See Comments)   nausea        Medication List     TAKE these medications    acetaminophen  500 MG tablet Commonly known as: TYLENOL  Take 1,000 mg by mouth every 6 (six) hours as needed for moderate pain (pain score 4-6) or mild pain (pain score 1-3).   albuterol  (2.5 MG/3ML) 0.083% nebulizer solution Commonly known as: PROVENTIL  Take 2.5 mg by nebulization every 6 (six) hours as needed for wheezing or shortness of breath.   albuterol  108 (90 Base)  MCG/ACT inhaler Commonly known as: VENTOLIN  HFA Inhale 2 puffs into the lungs every 4 (four) hours as needed for wheezing or shortness of breath.   ALPRAZolam  0.5 MG tablet Commonly known as: XANAX  Take 0.25-0.5 mg by mouth at bedtime.   amoxicillin-clavulanate 875-125 MG tablet Commonly known as: AUGMENTIN Take 1 tablet by mouth 2 (two) times daily.   aspirin  EC 81 MG tablet Take 81 mg by mouth daily.   atorvastatin  40 MG tablet Commonly known as: LIPITOR Take 40 mg by mouth at bedtime.   azithromycin  500 MG tablet Commonly known as: ZITHROMAX  Take 500 mg by mouth every Monday, Wednesday, and Friday.   benzonatate 100 MG capsule Commonly known as: TESSALON Take 100 mg by mouth 3 (three) times daily as needed for cough.   budesonide  0.5 MG/2ML nebulizer solution Commonly known as: PULMICORT  USE 1 VIAL VIA NEBULIZER EVERY 12 HOURS   carvedilol  6.25 MG tablet Commonly known as: COREG  Take 6.25 mg by mouth 2 (two) times daily with a meal.   clopidogrel  75 MG tablet Commonly known as: PLAVIX  Take 75 mg by mouth daily.   famotidine 20 MG tablet Commonly known as: PEPCID Take 20 mg by mouth 2 (two) times daily as needed for heartburn.   folic acid  1 MG tablet Commonly known as: FOLVITE  Take 1 mg by mouth daily.   formoterol 20 MCG/2ML nebulizer solution  Commonly known as: PERFOROMIST Take 20 mcg by nebulization 2 (two) times daily.   ipratropium-albuterol  0.5-2.5 (3) MG/3ML Soln Commonly known as: DUONEB Take 3 mLs by nebulization every 6 (six) hours as needed (for shortness of breath).   lisinopril  10 MG tablet Commonly known as: ZESTRIL  Take 10 mg by mouth at bedtime.   nitroGLYCERIN 0.4 MG SL tablet Commonly known as: NITROSTAT Place 0.4 mg under the tongue every 5 (five) minutes as needed for chest pain.   predniSONE  10 MG tablet Commonly known as: DELTASONE  Take 4 tablets (40 mg) daily for 2 days, then, Take 3 tablets (30 mg) daily for 2 days, then,  Take 2 tablets (20 mg) daily for 2 days, then, Take 1 tablets (10 mg) daily for 1 days, then stop What changed: You were already taking a medication with the same name, and this prescription was added. Make sure you understand how and when to take each.   predniSONE  5 MG tablet Commonly known as: DELTASONE  Take 1-2 tablets (5-10 mg total) by mouth See admin instructions. Take 5 mg by mouth on daily and then take 10 mg by mouth once daily the next day. Alternating doses. Start taking on: July 18, 2023 What changed: These instructions start on July 18, 2023. If you are unsure what to do until then, ask your doctor or other care provider.        Follow-up Information     Arch Beans, PA-C Follow up in 1 week(s).   Specialty: Physician Assistant Contact information: [address] [place] [address] [phone]                  Time coordinating discharge: 39 minutes  Signed:  Monchel Pollitt  [facility] 07/10/2023, 8:19 AM

## 2023-07-10 NOTE — Plan of Care (Signed)

## 2023-07-21 ENCOUNTER — Telehealth (HOSPITAL_BASED_OUTPATIENT_CLINIC_OR_DEPARTMENT_OTHER): Payer: Self-pay

## 2023-09-14 NOTE — Progress Notes (Signed)
 Internal Medicine at [facility]   ASSESSMENT/PLAN:  1. COPD exacerbation (CMD) (Primary): -Acute on chronic; mild exacerbation x 1-2 weeks. Afebrile. -Continue oral steroid course + chronic azithromycin  course per pulmonology. Continue supplemental oxygen and nebulizers per pulmonology. -Increase guaifenesin  12 hr to 1200 mg BID x 7 days.  -Depomedrol 80 mg IM x 1 in the office. -Start doxycycline  100 mg BID x 10 days. -Notify the office of any persistence/worsening; next step is CXR. - methylPREDNISolone  acetate (DEPO-Medrol ) injection 80 mg - doxycycline  (VIBRA -TABS) 100 mg tablet; Take 1 tablet (100 mg total) by mouth 2 (two) times a day for 10 days. Take with 8 oz water. Do not lie down for at least 30 minutes after.  Dispense: 20 tablet; Refill: 0  Plan: Patient expresses understanding of their current medications and use.  If a new prescription was given today, then I discussed potential side effects, drug interactions, instructions for taking the medication, and the consequences of not taking it. Patient verbalized an understanding of these instructions. Patient is able to verbalize understanding of the care plan discussed today. Patient's medical and personal goals were discussed today. Barriers to current goals:  None Follow up as discussed in prev sched f/u Call sooner if needed.  Chief Complaint  Patient presents with  . Cough    SUBJECTIVE:  Kristy Weeks is a 73 y.o. female that presents to clinic today regarding the following issues: productive cough  Kristy Weeks presents for an acute visit to discuss a productive cough for 1-2 weeks. Her medical history is notable for severe COPD and chronic hypoxic respiratory for which she is under care of pulmonology. She has had recurrent hospital admissions d/t lung infections; last in 07/2023. For her COPD, she continues on a nebulizer regimen and supplemental oxygen per pulmonology. She is on chronic abx (azithromycin  500 mg 3x/week) and  chronic steroids (ie alternating dosages of prednisone  5 mg and 10 mg).   She has had increased productive cough for 1-2 weeks. She has felt that she has needed more supplemental oxygen requirement at home. She is taking mucinex  12 hr 600 mg BID at this time. Her CXR from 08/03/23 notes:  FINDINGS: Normal-sized heart. Tortuous and partially calcified thoracic aorta. Stable changes of COPD and right apical cavity. The previously demonstrated increased density in the left mid lower lung zone has resolved. The lungs remain hyperexpanded with extensive left upper lobe bullous changes and right basilar pleural and parenchymal scarring. Mild scoliosis.   IMPRESSION: 1. No acute abnormality. 2. Resolution of the previously demonstrated left mid and lower lung zone pneumonia or bronchitic changes. 3. Stable changes of COPD, right apical cavity and chronic scarring.  Chart review notes recent treatment has included:  -solumedrol 40 mg IV x 1 (08/03/23). -rocephin  2 g q24 hr IV (early 07/2023). -remdesivir 100 mg daily (early 07/2023). -augmentin  (06/2023) -levaquin  500 mg (03/2023)  She does not have any antibiotic allergies.  HISTORY: I have reviewed the patients problem list, current medications, allergies, and social history and updated them as needed.  Medical History[1] Family History[2]  Surgical History[3]  Current Medications[4]  Ms. Turkington  reports that she quit smoking about 7 years ago. Her smoking use included cigarettes. She has been exposed to tobacco smoke. She has never used smokeless tobacco.  ROS: The patient denies any fevers, chills, night sweats, headache, blurred vision, sore throat, coughing, shortness of breath, sputum production, chest pain, abdominal pain, nausea/vomiting/diarrhea, blood in the stool, dysuria, blood in urine, or leg swelling.  Review of Systems - All other systems reviewed are negative except as noted above.  OBJECTIVE:  Ms. Blayney  height is  1.626 m (5' 4) and weight is 49.1 kg (108 lb 4.8 oz). Her oral temperature is 98.9 F (37.2 C). Her blood pressure is 89/53 (abnormal) and her pulse is 76. Her oxygen saturation is 95%.   PHYSICAL:  General Appearance: White female w BMI 18.6. Alert and appears in no acute distress. Sitting comfortably in chair w/ family member present. Respiratory: Supplemental oxygen by nasal cannula. Diminished air movement and diffuse rhonchus breath sounds. No accessory muscle use, increased work of breathing or respiratory distress.  Cardiovascular: Regular rate and rhythm. Skin: Warm, dry. Psychiatric: Pleasant. Mood and affect normal. Not anxious or tearful. Neurological: Alert. Seated in a wheelchair.  Rosalva Catchings, PA-C Internal Medicine at [facility] 09/14/2023 1:58 PM       [1] Past Medical History: Diagnosis Date  . Anemia   . Consolidation lung   . COPD (chronic obstructive pulmonary disease)    (CMD)   . Coronary artery disease   . History of prediabetes 02/01/2023  . Hyperlipidemia   . Hypertension    controlled with medications  . Impaired fasting blood sugar 07/14/2016   12/2017: -Chronic; mild prediabetes with a1c 5.7%.  . Lung consolidation 09/03/2018   Added automatically from request for surgery [phone]  . Microscopic hematuria 06/30/2019  . Mild renal insufficiency 07/20/2018   06/2018: Mild with GFR 71-80  12/2018: Stable mild renal decline with GFR 75-84. Associated with microalbuminuria. She takes ACEI  . Myocardial infarction    (CMD)   . Oxygen dependent    2L/min  . Pneumonia of right upper lobe due to infectious organism 07/20/2018  . Prediabetes 08/08/2021  [2] Family History Problem Relation Name Age of Onset  . Alzheimer's disease Mother    . Depression Mother    . Macular degeneration Mother    . Arthritis Father    . Heart disease Father    . Heart disease Brother    . Breast cancer Neg Hx    . Cancer Neg Hx    . Diabetes Neg Hx    . Glaucoma Neg Hx     . Retinal detachment Neg Hx    . Stroke Neg Hx    . Thyroid disease Neg Hx    [3] Past Surgical History: Procedure Laterality Date  . BRONCHOSCOPY Right 09/15/2018   Procedure: BRONCHOSCOPY;  Surgeon: Leo Pinion, MD;  Location: [facility] ENDO OR;  Service: Pulmonary;  Laterality: Right;  . CATARACT EXTRACTION W/  INTRAOCULAR LENS IMPLANT Right 02/25/2021   Procedure: PHACOEMULSIFICATION PC / IOL LEVEL 1;  Surgeon: Modesto Tonia Obey, MD;  Location: [facility] OUTPATIENT OR;  Service: Ophthalmology;  Laterality: Right;  Carvedilol , ERM, small pupil, h/o MI, hypoxemia, COPD-severe-on O2, CKD, narrow angles, possible iris expansion, Malyugin ring, Omidria, extra Viscoat  . COLPOSCOPY     Procedure: COLPOSCOPY  . CORONARY ANGIOPLASTY WITH STENT PLACEMENT  2006   Procedure: CORONARY ANGIOPLASTY WITH STENT PLACEMENT  [4] Current Outpatient Medications  Medication Sig Dispense Refill  . ALPRAZolam  (XANAX ) 0.5 mg tablet TAKE 1/2 TO 1 TABLET(0.25 TO 0.5 MG) BY MOUTH DAILY AS NEEDED FOR ANXIETY (Patient taking differently: Take 0.5-1 tablets by mouth daily as needed.) 30 tablet 2  . aspirin  81 mg EC tablet Take 81 mg by mouth daily. 30 tablet 11  . atorvastatin  (LIPITOR) 40 mg tablet TAKE 1 TABLET(40 MG) BY MOUTH DAILY (Patient taking differently:  Take 40 mg by mouth every evening.) 90 tablet 3  . budesonide  (PULMICORT ) 0.5 mg/2 mL nebulizer solution Use 1 vial in nebulizer every 12 hours. (Patient taking differently: Take 0.5 mg by nebulization in the morning and 0.5 mg before bedtime. Use 1 vial in nebulizer every 12 hours.) 120 mL 11  . carvediloL  (COREG ) 6.25 mg tablet Take 1 tablet (6.25 mg total) by mouth in the morning and 1 tablet (6.25 mg total) in the evening. Take with meals. (Patient taking differently: Take 3.125 mg by mouth in the morning and 3.125 mg in the evening. Take with meals.) 180 tablet 3  . clopidogreL  (PLAVIX ) 75 mg tablet Take 1 tablet (75 mg total) by mouth daily.  (Patient taking differently: Take 75 mg by mouth every morning.) 90 tablet 3  . famotidine (PEPCID) 20 mg tablet Take 1 tablet (20 mg total) by mouth 2 (two) times a day as needed for heartburn. 180 tablet 3  . folic acid  (FOLVITE ) 1 mg tablet TAKE 1 TABLET(1 MG) BY MOUTH DAILY 90 tablet 1  . formoterol  (PERFOROMIST ) 20 mcg/2 mL nebu nebulizer solution 1 vial every 12 hours (Patient taking differently: Take 20 mcg by nebulization in the morning and 20 mcg before bedtime. 1 vial every 12 hours.) 120 mL 3  . lisinopriL  (PRINIVIL ) 10 mg tablet Take 1 tablet (10 mg total) by mouth daily. (Patient taking differently: Take 5 mg by mouth daily.) 90 tablet 3  . nitroglycerin  (NITROSTAT ) 0.4 mg SL tablet Place 1 tablet (0.4 mg total) under the tongue every 5 (five) minutes as needed for chest pain. 25 tablet 1  . predniSONE  (DELTASONE ) 5 mg tablet Take 1 tablet by mouth one day then 2 tablets together the next day then back to 1 tablet daily, alternate for 1 month (Patient taking differently: Take 10 mg by mouth daily. Take 1 tablet by mouth one day then 2 tablets together the next day then back to 1 tablet daily, alternate for 1 month) 45 tablet 2  . predniSONE  (DELTASONE ) 5 mg tablet Take 5 mg by mouth daily. Take 1 tablet by mouth one day then 2 tablets together the next day then back to 1 tablet daily, alternate for 1 month    . revefenacin  (Yupelri ) 175 mcg/3 mL nebulizer solution Take 3 mL (175 mcg total) by nebulization daily. 90 mL 3  . azithromycin  (ZITHROMAX ) 500 mg tablet Take 1 tablet (500 mg total) by mouth 3 (three) times a week.    . doxycycline  (VIBRA -TABS) 100 mg tablet Take 1 tablet (100 mg total) by mouth 2 (two) times a day for 10 days. Take with 8 oz water. Do not lie down for at least 30 minutes after. 20 tablet 0   No current facility-administered medications for this visit.

## 2023-10-01 NOTE — Progress Notes (Signed)
 Subjective:   Patient ID: Kristy Weeks is a 73 y.o. female.  HPI  73 year old ex-smoker with severe COPD and chronic respiratory failure who presents for evaluation of persistent cough productive of purulent sputum.  Patient was placed on course of doxycycline , also received Depo-Medrol  injection, by her primary care provider a few weeks ago for suspected COPD exacerbation.  Patient feels her condition improved initially, reports cough has continued, no recent fever, chills, chest pain, or hemoptysis.  Patient has history of MAI infection of lungs, completed multi-drug therapy.  She has responded well to Yupelri , also takes budesonide  and formoterol  daily via nebulizer.  Patient remains on continuous oxygen therapy up to 4 L/min with exertion.  Review of Systems   A complete ROS was performed with pertinent positives/negatives noted in the HPI. The remainder of the ROS is negative.  Objective:   Physical Exam   Constitutional: She is oriented to person, place, and time. She appears well-developed and well-nourished.  Head: Normocephalic and atraumatic.  Eyes: Conjunctivae are normal. Pupils are equal, round, and reactive to light.  Neck: Normal range of motion.  Cardiovascular: Normal rate and regular rhythm. Exam reveals no gallop and no friction rub.  No murmur heard. Pulmonary/Chest: Effort normal and breath sounds diminished with bilateral rhonchi and soft expiratory wheezing. Abdominal: Soft.  Musculoskeletal: Normal range of motion.  Neurological: She is alert and oriented to person, place, and time.  Skin: Skin is warm and dry.  Psychiatric: She has a normal mood and affect. Her behavior is normal. Judgment and thought content normal.   Assessment/Plan:   1.  Productive cough 2.  COPD, severe 3.  Chronic respiratory failure 4.  H/O Mycobacterium avium complex infection  Patient will submit sputum specimen for culture and AFB testing.  She received prescription for  alternate course of prednisone  to take as directed.  She will also start additional doxycycline  course, has been informed potential side effects of medication.  We will modify her plan of care based on review of pending sputum culture results.  She will continue medication and oxygen therapy for COPD as directed.  She will follow-up in 1-2 months or visit sooner if needed.

## 2023-10-09 NOTE — Progress Notes (Signed)
 Done

## 2023-10-13 ENCOUNTER — Other Ambulatory Visit: Payer: Self-pay

## 2023-10-13 ENCOUNTER — Emergency Department (HOSPITAL_BASED_OUTPATIENT_CLINIC_OR_DEPARTMENT_OTHER)

## 2023-10-13 ENCOUNTER — Encounter (HOSPITAL_BASED_OUTPATIENT_CLINIC_OR_DEPARTMENT_OTHER): Payer: Self-pay | Admitting: Emergency Medicine

## 2023-10-13 ENCOUNTER — Inpatient Hospital Stay (HOSPITAL_BASED_OUTPATIENT_CLINIC_OR_DEPARTMENT_OTHER)
Admission: EM | Admit: 2023-10-13 | Discharge: 2023-10-21 | DRG: 176 | Disposition: A | Discharge status: Home / Self Care | Attending: Internal Medicine | Admitting: Internal Medicine

## 2023-10-13 DIAGNOSIS — G8929 Other chronic pain: Secondary | ICD-10-CM | POA: Diagnosis present

## 2023-10-13 DIAGNOSIS — E785 Hyperlipidemia, unspecified: Secondary | ICD-10-CM | POA: Diagnosis present

## 2023-10-13 DIAGNOSIS — Z7901 Long term (current) use of anticoagulants: Secondary | ICD-10-CM

## 2023-10-13 DIAGNOSIS — J440 Chronic obstructive pulmonary disease with acute lower respiratory infection: Secondary | ICD-10-CM | POA: Diagnosis present

## 2023-10-13 DIAGNOSIS — Z7902 Long term (current) use of antithrombotics/antiplatelets: Secondary | ICD-10-CM

## 2023-10-13 DIAGNOSIS — J449 Chronic obstructive pulmonary disease, unspecified: Secondary | ICD-10-CM | POA: Diagnosis present

## 2023-10-13 DIAGNOSIS — R110 Nausea: Secondary | ICD-10-CM | POA: Diagnosis present

## 2023-10-13 DIAGNOSIS — Z8616 Personal history of COVID-19: Secondary | ICD-10-CM

## 2023-10-13 DIAGNOSIS — D689 Coagulation defect, unspecified: Secondary | ICD-10-CM | POA: Diagnosis present

## 2023-10-13 DIAGNOSIS — Z792 Long term (current) use of antibiotics: Secondary | ICD-10-CM

## 2023-10-13 DIAGNOSIS — R042 Hemoptysis: Secondary | ICD-10-CM | POA: Diagnosis present

## 2023-10-13 DIAGNOSIS — I2693 Single subsegmental pulmonary embolism without acute cor pulmonale: Principal | ICD-10-CM | POA: Diagnosis present

## 2023-10-13 DIAGNOSIS — Z7951 Long term (current) use of inhaled steroids: Secondary | ICD-10-CM

## 2023-10-13 DIAGNOSIS — Z79899 Other long term (current) drug therapy: Secondary | ICD-10-CM

## 2023-10-13 DIAGNOSIS — I2510 Atherosclerotic heart disease of native coronary artery without angina pectoris: Secondary | ICD-10-CM | POA: Diagnosis present

## 2023-10-13 DIAGNOSIS — J479 Bronchiectasis, uncomplicated: Secondary | ICD-10-CM

## 2023-10-13 DIAGNOSIS — I10 Essential (primary) hypertension: Secondary | ICD-10-CM | POA: Diagnosis present

## 2023-10-13 DIAGNOSIS — Z7952 Long term (current) use of systemic steroids: Secondary | ICD-10-CM

## 2023-10-13 DIAGNOSIS — R911 Solitary pulmonary nodule: Secondary | ICD-10-CM | POA: Diagnosis present

## 2023-10-13 DIAGNOSIS — J9612 Chronic respiratory failure with hypercapnia: Secondary | ICD-10-CM | POA: Diagnosis present

## 2023-10-13 DIAGNOSIS — Z955 Presence of coronary angioplasty implant and graft: Secondary | ICD-10-CM

## 2023-10-13 DIAGNOSIS — Z8249 Family history of ischemic heart disease and other diseases of the circulatory system: Secondary | ICD-10-CM

## 2023-10-13 DIAGNOSIS — J439 Emphysema, unspecified: Secondary | ICD-10-CM | POA: Diagnosis present

## 2023-10-13 DIAGNOSIS — Z9981 Dependence on supplemental oxygen: Secondary | ICD-10-CM

## 2023-10-13 DIAGNOSIS — I2489 Other forms of acute ischemic heart disease: Secondary | ICD-10-CM | POA: Diagnosis present

## 2023-10-13 DIAGNOSIS — J441 Chronic obstructive pulmonary disease with (acute) exacerbation: Secondary | ICD-10-CM | POA: Diagnosis present

## 2023-10-13 DIAGNOSIS — E871 Hypo-osmolality and hyponatremia: Secondary | ICD-10-CM | POA: Diagnosis present

## 2023-10-13 DIAGNOSIS — M549 Dorsalgia, unspecified: Secondary | ICD-10-CM | POA: Diagnosis present

## 2023-10-13 DIAGNOSIS — A310 Pulmonary mycobacterial infection: Secondary | ICD-10-CM | POA: Diagnosis present

## 2023-10-13 DIAGNOSIS — I2699 Other pulmonary embolism without acute cor pulmonale: Principal | ICD-10-CM | POA: Diagnosis present

## 2023-10-13 DIAGNOSIS — Z604 Social exclusion and rejection: Secondary | ICD-10-CM | POA: Diagnosis present

## 2023-10-13 DIAGNOSIS — J9611 Chronic respiratory failure with hypoxia: Secondary | ICD-10-CM | POA: Diagnosis present

## 2023-10-13 DIAGNOSIS — Z7982 Long term (current) use of aspirin: Secondary | ICD-10-CM

## 2023-10-13 DIAGNOSIS — D6489 Other specified anemias: Secondary | ICD-10-CM | POA: Diagnosis present

## 2023-10-13 DIAGNOSIS — J984 Other disorders of lung: Secondary | ICD-10-CM

## 2023-10-13 DIAGNOSIS — Z888 Allergy status to other drugs, medicaments and biological substances status: Secondary | ICD-10-CM

## 2023-10-13 DIAGNOSIS — Z87891 Personal history of nicotine dependence: Secondary | ICD-10-CM

## 2023-10-13 LAB — BASIC METABOLIC PANEL WITH GFR
Anion gap: 9 (ref 5–15)
BUN: 20 mg/dL (ref 8–23)
CO2: 33 mmol/L — ABNORMAL HIGH (ref 22–32)
Calcium: 9.8 mg/dL (ref 8.9–10.3)
Chloride: 98 mmol/L (ref 98–111)
Creatinine, Ser: 0.64 mg/dL (ref 0.44–1.00)
GFR, Estimated: >60 mL/min (ref >60)
Glucose, Bld: 143 mg/dL — ABNORMAL HIGH (ref 70–99)
Potassium: 4.8 mmol/L (ref 3.5–5.1)
Sodium: 140 mmol/L (ref 135–145)

## 2023-10-13 LAB — CBC
HCT: 38.3 % (ref 36.0–46.0)
Hemoglobin: 12 g/dL (ref 12.0–15.0)
MCH: 30.7 pg (ref 26.0–34.0)
MCHC: 31.3 g/dL (ref 30.0–36.0)
MCV: 98 fL (ref 80.0–100.0)
Platelets: 230 K/uL (ref 150–400)
RBC: 3.91 MIL/uL (ref 3.87–5.11)
RDW: 12.6 % (ref 11.5–15.5)
WBC: 9.1 K/uL (ref 4.0–10.5)
nRBC: 0 % (ref 0.0–0.2)

## 2023-10-13 MED ORDER — HEPARIN (PORCINE) 25000 UT/250ML-% IV SOLN
850.0000 [IU]/h | INTRAVENOUS | Status: DC
  Administered 2023-10-13: 800 [IU]/h via INTRAVENOUS
  Administered 2023-10-15: 850 [IU]/h via INTRAVENOUS
  Administered 2023-10-16: 950 [IU]/h via INTRAVENOUS
  Filled 2023-10-13 (×3): qty 250

## 2023-10-13 MED ORDER — ALPRAZOLAM 0.5 MG PO TABS
0.5000 mg | ORAL_TABLET | Freq: Once | ORAL | Status: AC
  Administered 2023-10-13: 0.5 mg via ORAL
  Filled 2023-10-13: qty 1

## 2023-10-13 MED ORDER — IOHEXOL 350 MG/ML SOLN
70.0000 mL | Freq: Once | INTRAVENOUS | Status: AC | PRN
  Administered 2023-10-13: 70 mL via INTRAVENOUS

## 2023-10-13 MED ORDER — HEPARIN BOLUS VIA INFUSION
3000.0000 [IU] | Freq: Once | INTRAVENOUS | Status: AC
Start: 2023-10-13 — End: 2023-10-13
  Administered 2023-10-13: 3000 [IU] via INTRAVENOUS

## 2023-10-13 NOTE — ED Provider Notes (Signed)
 [place] EMERGENCY DEPARTMENT AT [facility] Provider Note   CSN: [id number] Arrival date & time: 10/13/23  1919     Patient presents with: Hemoptysis   Kristy Weeks is a 73 y.o. female with a history of COPD on 2 to 4 L nasal cannula at home presenting to the ER with shortness of breath.  Patient reports that she has been short of breath for a long time, and she has chronic dyspnea on exertion, sees pulmonology.  She has been on 2 courses of prednisone  and 2 courses of doxycycline  over the past month.  She was concerned today because she was coughing hard and noted she was coughing up blood with her sputum.  No history of DVT or PE.  She is on Plavix .  {Add pertinent medical, surgical, social history, OB history to HPI:32947} HPI     Prior to Admission medications   Medication Sig Start Date End Date Taking? Authorizing Provider  acetaminophen  (TYLENOL ) 500 MG tablet Take 1,000 mg by mouth every 6 (six) hours as needed for moderate pain (pain score 4-6) or mild pain (pain score 1-3).    [provider]  albuterol  (PROVENTIL ) (2.5 MG/3ML) 0.083% nebulizer solution Take 2.5 mg by nebulization every 6 (six) hours as needed for wheezing or shortness of breath.    [provider]  albuterol  (VENTOLIN  HFA) 108 (90 Base) MCG/ACT inhaler Inhale 2 puffs into the lungs every 4 (four) hours as needed for wheezing or shortness of breath. 04/12/23   [provider]  ALPRAZolam  (XANAX ) 0.5 MG tablet Take 0.25-0.5 mg by mouth at bedtime. 07/07/23   [provider]  amoxicillin -clavulanate (AUGMENTIN ) 875-125 MG tablet Take 1 tablet by mouth 2 (two) times daily. 07/10/23   Pokhrel, Laxman, MD  aspirin  EC 81 MG tablet Take 81 mg by mouth daily.    [provider]  atorvastatin  (LIPITOR) 40 MG tablet Take 40 mg by mouth at bedtime.    [provider]  benzonatate (TESSALON) 100 MG capsule Take 100 mg by mouth 3 (three) times daily as  needed for cough. 06/13/23   [provider]  budesonide  (PULMICORT ) 0.5 MG/2ML nebulizer solution USE 1 VIAL VIA NEBULIZER EVERY 12 HOURS    [provider]  carvedilol  (COREG ) 6.25 MG tablet Take 6.25 mg by mouth 2 (two) times daily with a meal.    [provider]  clopidogrel  (PLAVIX ) 75 MG tablet Take 75 mg by mouth daily.    [provider]  famotidine (PEPCID) 20 MG tablet Take 20 mg by mouth 2 (two) times daily as needed for heartburn. 06/01/23   [provider]  folic acid  (FOLVITE ) 1 MG tablet Take 1 mg by mouth daily. 03/13/22   [provider]  formoterol  (PERFOROMIST ) 20 MCG/2ML nebulizer solution Take 20 mcg by nebulization 2 (two) times daily.    [provider]  ipratropium-albuterol  (DUONEB) 0.5-2.5 (3) MG/3ML SOLN Take 3 mLs by nebulization every 6 (six) hours as needed (for shortness of breath).     [provider]  lisinopril  (PRINIVIL ,ZESTRIL ) 10 MG tablet Take 10 mg by mouth at bedtime.    [provider]  nitroGLYCERIN  (NITROSTAT ) 0.4 MG SL tablet Place 0.4 mg under the tongue every 5 (five) minutes as needed for chest pain. 04/23/23   [provider]  predniSONE  (DELTASONE ) 10 MG tablet Take 4 tablets (40 mg) daily for 2 days, then, Take 3 tablets (30 mg) daily for 2 days, then, Take 2 tablets (  20 mg) daily for 2 days, then, Take 1 tablets (10 mg) daily for 1 days, then stop 07/10/23   Pokhrel, Laxman, MD  predniSONE  (DELTASONE ) 5 MG tablet Take 1-2 tablets (5-10 mg total) by mouth See admin instructions. Take 5 mg by mouth on daily and then take 10 mg by mouth once daily the next day. Alternating doses. 07/18/23   Pokhrel, Laxman, MD    Allergies: Cefdinir and Hydrocodone -acetaminophen     Review of Systems  Updated Vital Signs BP (!) 182/102   Pulse 90   Temp 98.3 F (36.8 C) (Oral)   Resp (!) 23   Ht 5' 4 (1.626 m)   Wt 49 kg   SpO2 95%   BMI 18.54 kg/m   Physical  Exam Constitutional:      General: She is not in acute distress.    Comments: Thin, frail  HENT:     Head: Normocephalic and atraumatic.  Eyes:     Conjunctiva/sclera: Conjunctivae normal.     Pupils: Pupils are equal, round, and reactive to light.  Cardiovascular:     Rate and Rhythm: Normal rate and regular rhythm.  Pulmonary:     Effort: Pulmonary effort is normal. No respiratory distress.     Comments: 4 L nasal cannula Distant rhonchorous breath sounds Abdominal:     General: There is no distension.     Tenderness: There is no abdominal tenderness.  Skin:    General: Skin is warm and dry.  Neurological:     General: No focal deficit present.     Mental Status: She is alert. Mental status is at baseline.  Psychiatric:        Mood and Affect: Mood normal.        Behavior: Behavior normal.     (all labs ordered are listed, but only abnormal results are displayed) Labs Reviewed  CBC  BASIC METABOLIC PANEL WITH GFR    EKG: None  Radiology: No results found.  {Document cardiac monitor, telemetry assessment procedure when appropriate:32947} Procedures   Medications Ordered in the ED - No data to display    {Click here for ABCD2, HEART and other calculators REFRESH Note before signing:1}                              Medical Decision Making Amount and/or Complexity of Data Reviewed Labs: ordered. Radiology: ordered.   Patient is here with hemoptysis.  Differential would include bronchitis versus pulmonary mass or malignancy versus pulmonary embolism versus infection versus other  I reviewed external records and she has a history of atypical infections of the lungs in the past.  She has been treated now with 2 courses of doxycycline  but was treated for MAC in the past as well.  I reviewed her labs and workup today, notable for ***  Her EKG my interpretation does not show an acute ischemic findings.  She is stable, with no increased oxygen requirement.  I do  not see indication for further antibiotic treatment or steroids at this time.  She is at her baseline work of breathing according to both the patient and her son at the bedside.  {Document critical care time when appropriate  Document review of labs and clinical decision tools ie CHADS2VASC2, etc  Document your independent review of radiology images and any outside records  Document your discussion with family members, caretakers and with consultants  Document social determinants of health affecting pt's care  Document your decision making why or why not admission, treatments were needed:32947:::1}   Final diagnoses:  None    ED Discharge Orders     None

## 2023-10-13 NOTE — ED Notes (Signed)
 Pt placed in a gown with a 5 lead, bp cuff and pusle ox

## 2023-10-13 NOTE — ED Triage Notes (Signed)
 Pt c/o hemoptysis x 3 hours. Denies recent illness, increased O2 demands.  Family reports persistent congested cough since July. Has trailed prednisone  and doxycycline  x 2 with no improvement.    Also reports lower back pain, intermittent, x 3 days.   Had Covid in July.

## 2023-10-13 NOTE — ED Notes (Signed)
 Patient transported to CT

## 2023-10-13 NOTE — Plan of Care (Signed)
 [facility] to [facility] or [facility]  73 year old female past medical history of COPD, hyperlipidemia, chronic hypoxic respiratory failure 4 L oxygen at baseline, smoking cigarette, CAD, and essential hypertension presented to emergency department with complaining of shortness of breath, chronic dyspnea on exertion. Patient recently seen by pulmonology completed 2 courses of prednisone  and doxycycline  past month. Patient is concerned today because she was having very hard cough and noticed coughing up blood in her sputum.  At presentation to ED patient found hypertensive-blood pressure improved.  Hemodynamically stable.  Afebrile. CBC unremarkable.  BMP unremarkable except elevated bicarb level 33-chronic.  CTA chest showing  1. Interval development of a right lower lobe segmental and subsegmental pulmonary embolus. No associated right heart strain or pulmonary infarction. 2.Redemonstration of large cavitary lesion of the right upper lobe that is continuous with a chronic right lower lobe consolidation and surrounding satellite nodular airspace opacities in the right lower lobe and right middle lobes. No significant change. Finding may be infectious or malignant in etiology. 3. Emphysema (ICD10-J43.9)-severe. 4. Aortic Atherosclerosis (ICD10-I70.0) -severe including four-vessel coronary artery calcification. 5. 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 6. 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 Aneurysmal partially visualized infrarenal abdominal aorta. Consider outpatient CTA abdomen pelvis for further evaluation. 7. 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 8. Atrophic right kidney.  ED physician reported that patient has sputum mixed with blood but while in the ED patient did not have any recurrent episodes neither no history or evidence of hemoptysis.  In the ED patient has been started on  heparin  drip.  Patient also following outpatient pulmonology with [facility]   Hospitalist has been consulted for further evaluation management of acute development of PE.     Monie Shere, MD [facility] 10/13/2023, 10:54 PM

## 2023-10-13 NOTE — Progress Notes (Signed)
 PHARMACY - ANTICOAGULATION CONSULT NOTE  Pharmacy Consult for Heparin  Indication: pulmonary embolus  Allergies  Allergen Reactions   Cefdinir Other (See Comments)    Abdominal pain   Hydrocodone -Acetaminophen  Other (See Comments)    nausea    Patient Measurements: Height: 5' 4 (162.6 cm) Weight: 49 kg (108 lb) IBW/kg (Calculated) : 54.7 HEPARIN  DW (KG): 49  Vital Signs: Temp: 98.3 F (36.8 C) (09/16 1933) Temp Source: Oral (09/16 1933) BP: 182/102 (09/16 1933) Pulse Rate: 90 (09/16 1933)  Labs: Recent Labs    10/13/23 1958  HGB 12.0  HCT 38.3  PLT 230  CREATININE 0.64    Estimated Creatinine Clearance: 47.7 mL/min (by C-G formula based on SCr of 0.64 mg/dL).   Medical History: Past Medical History:  Diagnosis Date   COPD (chronic obstructive pulmonary disease) (HCC)    wears 2L most of the time   Coronary artery disease    Hypertension     Assessment: 54 yoF presenting with SOB and hemoptysis. Pt has no previous history of DVT or PE. CTA on 9/16 reveals PE with no associated right heart strain. Pt with history of CAD and on Plavix . Pt has no history of oral anticoagulant use.  Hgb and PLT WNL  Goal of Therapy:  Heparin  level 0.3-0.7 units/ml Monitor platelets by anticoagulation protocol: Yes   Plan:  Give 3000 units bolus x 1 Start heparin  infusion at 800 units/hr Check heparin  level in 8 hours and daily while on heparin  Continue to monitor H&H and platelets  Prentice DOROTHA Favors, PharmD PGY1 Health-System Pharmacy Administration and Leadership Resident [facility]  10/13/2023 10:37 PM

## 2023-10-14 ENCOUNTER — Encounter (HOSPITAL_COMMUNITY): Payer: Self-pay | Admitting: Internal Medicine

## 2023-10-14 ENCOUNTER — Inpatient Hospital Stay (HOSPITAL_COMMUNITY)

## 2023-10-14 DIAGNOSIS — Z7951 Long term (current) use of inhaled steroids: Secondary | ICD-10-CM | POA: Diagnosis not present

## 2023-10-14 DIAGNOSIS — I2699 Other pulmonary embolism without acute cor pulmonale: Secondary | ICD-10-CM | POA: Diagnosis present

## 2023-10-14 DIAGNOSIS — Z8616 Personal history of COVID-19: Secondary | ICD-10-CM | POA: Diagnosis not present

## 2023-10-14 DIAGNOSIS — J432 Centrilobular emphysema: Secondary | ICD-10-CM | POA: Diagnosis not present

## 2023-10-14 DIAGNOSIS — Z955 Presence of coronary angioplasty implant and graft: Secondary | ICD-10-CM | POA: Diagnosis not present

## 2023-10-14 DIAGNOSIS — J439 Emphysema, unspecified: Secondary | ICD-10-CM | POA: Diagnosis present

## 2023-10-14 DIAGNOSIS — I10 Essential (primary) hypertension: Secondary | ICD-10-CM

## 2023-10-14 DIAGNOSIS — I159 Secondary hypertension, unspecified: Secondary | ICD-10-CM | POA: Diagnosis not present

## 2023-10-14 DIAGNOSIS — Z7901 Long term (current) use of anticoagulants: Secondary | ICD-10-CM | POA: Diagnosis not present

## 2023-10-14 DIAGNOSIS — J441 Chronic obstructive pulmonary disease with (acute) exacerbation: Secondary | ICD-10-CM | POA: Diagnosis present

## 2023-10-14 DIAGNOSIS — J9611 Chronic respiratory failure with hypoxia: Secondary | ICD-10-CM | POA: Diagnosis present

## 2023-10-14 DIAGNOSIS — R042 Hemoptysis: Secondary | ICD-10-CM

## 2023-10-14 DIAGNOSIS — I2510 Atherosclerotic heart disease of native coronary artery without angina pectoris: Secondary | ICD-10-CM | POA: Diagnosis present

## 2023-10-14 DIAGNOSIS — Z87891 Personal history of nicotine dependence: Secondary | ICD-10-CM | POA: Diagnosis not present

## 2023-10-14 DIAGNOSIS — J9612 Chronic respiratory failure with hypercapnia: Secondary | ICD-10-CM | POA: Diagnosis present

## 2023-10-14 DIAGNOSIS — Z888 Allergy status to other drugs, medicaments and biological substances status: Secondary | ICD-10-CM | POA: Diagnosis not present

## 2023-10-14 DIAGNOSIS — J440 Chronic obstructive pulmonary disease with acute lower respiratory infection: Secondary | ICD-10-CM | POA: Diagnosis present

## 2023-10-14 DIAGNOSIS — E871 Hypo-osmolality and hyponatremia: Secondary | ICD-10-CM | POA: Diagnosis present

## 2023-10-14 DIAGNOSIS — J984 Other disorders of lung: Secondary | ICD-10-CM | POA: Diagnosis not present

## 2023-10-14 DIAGNOSIS — A310 Pulmonary mycobacterial infection: Secondary | ICD-10-CM | POA: Diagnosis present

## 2023-10-14 DIAGNOSIS — Z7982 Long term (current) use of aspirin: Secondary | ICD-10-CM | POA: Diagnosis not present

## 2023-10-14 DIAGNOSIS — Z9981 Dependence on supplemental oxygen: Secondary | ICD-10-CM | POA: Diagnosis not present

## 2023-10-14 DIAGNOSIS — D689 Coagulation defect, unspecified: Secondary | ICD-10-CM | POA: Diagnosis present

## 2023-10-14 DIAGNOSIS — I2489 Other forms of acute ischemic heart disease: Secondary | ICD-10-CM | POA: Diagnosis present

## 2023-10-14 DIAGNOSIS — I2609 Other pulmonary embolism with acute cor pulmonale: Secondary | ICD-10-CM | POA: Diagnosis not present

## 2023-10-14 DIAGNOSIS — I2693 Single subsegmental pulmonary embolism without acute cor pulmonale: Secondary | ICD-10-CM | POA: Diagnosis present

## 2023-10-14 DIAGNOSIS — R609 Edema, unspecified: Secondary | ICD-10-CM

## 2023-10-14 DIAGNOSIS — Z79899 Other long term (current) drug therapy: Secondary | ICD-10-CM | POA: Diagnosis not present

## 2023-10-14 DIAGNOSIS — E785 Hyperlipidemia, unspecified: Secondary | ICD-10-CM | POA: Diagnosis present

## 2023-10-14 DIAGNOSIS — Z8249 Family history of ischemic heart disease and other diseases of the circulatory system: Secondary | ICD-10-CM | POA: Diagnosis not present

## 2023-10-14 LAB — CBC
HCT: 38.9 % (ref 36.0–46.0)
HCT: 39.6 % (ref 36.0–46.0)
Hemoglobin: 11.3 g/dL — ABNORMAL LOW (ref 12.0–15.0)
Hemoglobin: 11.7 g/dL — ABNORMAL LOW (ref 12.0–15.0)
MCH: 29.4 pg (ref 26.0–34.0)
MCH: 30.4 pg (ref 26.0–34.0)
MCHC: 29 g/dL — ABNORMAL LOW (ref 30.0–36.0)
MCHC: 29.5 g/dL — ABNORMAL LOW (ref 30.0–36.0)
MCV: 101 fL — ABNORMAL HIGH (ref 80.0–100.0)
MCV: 102.9 fL — ABNORMAL HIGH (ref 80.0–100.0)
Platelets: 204 K/uL (ref 150–400)
Platelets: 201 K/uL (ref 150–400)
RBC: 3.85 MIL/uL — ABNORMAL LOW (ref 3.87–5.11)
RBC: 3.85 MIL/uL — ABNORMAL LOW (ref 3.87–5.11)
RDW: 12.7 % (ref 11.5–15.5)
RDW: 12.6 % (ref 11.5–15.5)
WBC: 7.5 K/uL (ref 4.0–10.5)
WBC: 8.5 K/uL (ref 4.0–10.5)
nRBC: 0 % (ref 0.0–0.2)
nRBC: 0 % (ref 0.0–0.2)

## 2023-10-14 LAB — ECHOCARDIOGRAM COMPLETE
Area-P 1/2: 2.71 cm2
Height: 64 in
P 1/2 time: 622 ms
S' Lateral: 3.1 cm
Weight: 1724.88 [oz_av]

## 2023-10-14 LAB — HEPATIC FUNCTION PANEL
ALT: 9 U/L (ref 0–44)
AST: 20 U/L (ref 15–41)
Albumin: 3.9 g/dL (ref 3.5–5.0)
Alkaline Phosphatase: 52 U/L (ref 38–126)
Bilirubin, Direct: 0.1 mg/dL (ref 0.0–0.2)
Indirect Bilirubin: 0.2 mg/dL — ABNORMAL LOW (ref 0.3–0.9)
Total Bilirubin: 0.4 mg/dL (ref 0.0–1.2)
Total Protein: 6.1 g/dL — ABNORMAL LOW (ref 6.5–8.1)

## 2023-10-14 LAB — CBC WITH DIFFERENTIAL/PLATELET

## 2023-10-14 LAB — BASIC METABOLIC PANEL WITH GFR
Anion gap: 11 (ref 5–15)
BUN: 19 mg/dL (ref 8–23)
CO2: 33 mmol/L — ABNORMAL HIGH (ref 22–32)
Calcium: 9.5 mg/dL (ref 8.9–10.3)
Chloride: 97 mmol/L — ABNORMAL LOW (ref 98–111)
Creatinine, Ser: 0.56 mg/dL (ref 0.44–1.00)
GFR, Estimated: >60 mL/min (ref >60)
Glucose, Bld: 125 mg/dL — ABNORMAL HIGH (ref 70–99)
Potassium: 3.9 mmol/L (ref 3.5–5.1)
Sodium: 141 mmol/L (ref 135–145)

## 2023-10-14 LAB — TYPE AND SCREEN
ABO/RH(D): A POS
Antibody Screen: NEGATIVE

## 2023-10-14 LAB — DIFFERENTIAL
Abs Immature Granulocytes: 0.04 K/uL (ref 0.00–0.07)
Basophils Absolute: 0 K/uL (ref 0.0–0.1)
Basophils Relative: 0 %
Eosinophils Absolute: 0.1 K/uL (ref 0.0–0.5)
Eosinophils Relative: 1 %
Immature Granulocytes: 1 %
Lymphocytes Relative: 23 %
Lymphs Abs: 1.7 K/uL (ref 0.7–4.0)
Monocytes Absolute: 0.8 K/uL (ref 0.1–1.0)
Monocytes Relative: 10 %
Neutro Abs: 4.8 K/uL (ref 1.7–7.7)
Neutrophils Relative %: 65 %

## 2023-10-14 LAB — ABO/RH: ABO/RH(D): A POS

## 2023-10-14 LAB — MRSA NEXT GEN BY PCR, NASAL: MRSA by PCR Next Gen: NOT DETECTED

## 2023-10-14 LAB — HEPARIN LEVEL (UNFRACTIONATED)
Heparin Unfractionated: 0.38 [IU]/mL (ref 0.30–0.70)
Heparin Unfractionated: 0.34 [IU]/mL (ref 0.30–0.70)

## 2023-10-14 LAB — TROPONIN T, HIGH SENSITIVITY
Troponin T High Sensitivity: 31 ng/L — ABNORMAL HIGH (ref 0–19)
Troponin T High Sensitivity: 31 ng/L — ABNORMAL HIGH (ref 0–19)

## 2023-10-14 LAB — PRO BRAIN NATRIURETIC PEPTIDE: Pro Brain Natriuretic Peptide: 995 pg/mL — ABNORMAL HIGH (ref <300.0)

## 2023-10-14 MED ORDER — NITROGLYCERIN 0.4 MG SL SUBL: 0.4000 mg | SUBLINGUAL_TABLET | SUBLINGUAL | Status: DC | PRN

## 2023-10-14 MED ORDER — ALBUTEROL SULFATE (2.5 MG/3ML) 0.083% IN NEBU
2.5000 mg | INHALATION_SOLUTION | Freq: Four times a day (QID) | RESPIRATORY_TRACT | Status: DC
Start: 2023-10-14 — End: 2023-10-16
  Administered 2023-10-14 – 2023-10-16 (×9): 2.5 mg via RESPIRATORY_TRACT
  Filled 2023-10-14 (×9): qty 3

## 2023-10-14 MED ORDER — PREDNISONE 10 MG PO TABS
10.0000 mg | ORAL_TABLET | Freq: Every day | ORAL | Status: DC
Start: 2023-10-14 — End: 2023-10-16
  Administered 2023-10-14 – 2023-10-16 (×3): 10 mg via ORAL
  Filled 2023-10-14 (×3): qty 1

## 2023-10-14 MED ORDER — CARVEDILOL 6.25 MG PO TABS
6.2500 mg | ORAL_TABLET | Freq: Two times a day (BID) | ORAL | Status: DC
  Administered 2023-10-14 – 2023-10-21 (×16): 6.25 mg via ORAL
  Filled 2023-10-14 (×16): qty 1

## 2023-10-14 MED ORDER — TRAMADOL HCL 50 MG PO TABS
50.0000 mg | ORAL_TABLET | Freq: Four times a day (QID) | ORAL | Status: DC | PRN
  Administered 2023-10-15: 50 mg via ORAL
  Filled 2023-10-14: qty 1

## 2023-10-14 MED ORDER — FOLIC ACID 1 MG PO TABS
1.0000 mg | ORAL_TABLET | Freq: Every day | ORAL | Status: DC
  Administered 2023-10-14 – 2023-10-21 (×8): 1 mg via ORAL
  Filled 2023-10-14 (×8): qty 1

## 2023-10-14 MED ORDER — ATORVASTATIN CALCIUM 40 MG PO TABS
40.0000 mg | ORAL_TABLET | Freq: Every day | ORAL | Status: DC
  Administered 2023-10-14 – 2023-10-20 (×7): 40 mg via ORAL
  Filled 2023-10-14 (×7): qty 1

## 2023-10-14 MED ORDER — ASPIRIN 81 MG PO TBEC
81.0000 mg | DELAYED_RELEASE_TABLET | Freq: Every day | ORAL | Status: DC
Start: 2023-10-14 — End: 2023-10-14
  Administered 2023-10-14: 81 mg via ORAL
  Filled 2023-10-14: qty 1

## 2023-10-14 MED ORDER — ACETAMINOPHEN 325 MG PO TABS
650.0000 mg | ORAL_TABLET | Freq: Four times a day (QID) | ORAL | Status: DC | PRN
  Administered 2023-10-14 – 2023-10-21 (×13): 650 mg via ORAL
  Filled 2023-10-14 (×12): qty 2

## 2023-10-14 MED ORDER — UMECLIDINIUM BROMIDE 62.5 MCG/ACT IN AEPB
1.0000 | INHALATION_SPRAY | Freq: Every day | RESPIRATORY_TRACT | Status: DC
  Administered 2023-10-14 – 2023-10-19 (×5): 1 via RESPIRATORY_TRACT
  Filled 2023-10-14: qty 7

## 2023-10-14 MED ORDER — HYDRALAZINE HCL 20 MG/ML IJ SOLN
10.0000 mg | Freq: Four times a day (QID) | INTRAMUSCULAR | Status: DC | PRN
  Administered 2023-10-15: 10 mg via INTRAVENOUS
  Filled 2023-10-14 (×2): qty 1

## 2023-10-14 MED ORDER — IPRATROPIUM-ALBUTEROL 0.5-2.5 (3) MG/3ML IN SOLN: 3.0000 mL | RESPIRATORY_TRACT | Status: DC | PRN

## 2023-10-14 MED ORDER — ACETAMINOPHEN 325 MG PO TABS
650.0000 mg | ORAL_TABLET | Freq: Four times a day (QID) | ORAL | Status: DC | PRN
  Administered 2023-10-14 – 2023-10-15 (×2): 650 mg via ORAL
  Filled 2023-10-14 (×4): qty 2

## 2023-10-14 MED ORDER — ARFORMOTEROL TARTRATE 15 MCG/2ML IN NEBU
15.0000 ug | INHALATION_SOLUTION | Freq: Two times a day (BID) | RESPIRATORY_TRACT | Status: DC
  Administered 2023-10-14 – 2023-10-21 (×15): 15 ug via RESPIRATORY_TRACT
  Filled 2023-10-14 (×15): qty 2

## 2023-10-14 MED ORDER — BUDESONIDE 0.5 MG/2ML IN SUSP
0.5000 mg | Freq: Two times a day (BID) | RESPIRATORY_TRACT | Status: DC
Start: 2023-10-14 — End: 2023-10-21
  Administered 2023-10-14 – 2023-10-21 (×15): 0.5 mg via RESPIRATORY_TRACT
  Filled 2023-10-14 (×15): qty 2

## 2023-10-14 MED ORDER — ALPRAZOLAM 0.25 MG PO TABS
0.2500 mg | ORAL_TABLET | Freq: Every day | ORAL | Status: DC
  Administered 2023-10-14 – 2023-10-20 (×7): 0.5 mg via ORAL
  Filled 2023-10-14 (×7): qty 2

## 2023-10-14 MED ORDER — ACETAMINOPHEN 650 MG RE SUPP: 650.0000 mg | Freq: Four times a day (QID) | RECTAL | Status: DC | PRN

## 2023-10-14 MED ORDER — CLOPIDOGREL BISULFATE 75 MG PO TABS
75.0000 mg | ORAL_TABLET | Freq: Every day | ORAL | Status: DC
  Administered 2023-10-14 – 2023-10-15 (×2): 75 mg via ORAL
  Filled 2023-10-14 (×2): qty 1

## 2023-10-14 MED ORDER — GUAIFENESIN ER 600 MG PO TB12
1200.0000 mg | ORAL_TABLET | Freq: Two times a day (BID) | ORAL | Status: DC
  Administered 2023-10-14 – 2023-10-21 (×15): 1200 mg via ORAL
  Filled 2023-10-14 (×15): qty 2

## 2023-10-14 MED ORDER — REVEFENACIN 175 MCG/3ML IN SOLN
175.0000 ug | Freq: Every day | RESPIRATORY_TRACT | Status: DC
  Administered 2023-10-14 – 2023-10-21 (×8): 175 ug via RESPIRATORY_TRACT
  Filled 2023-10-14 (×8): qty 3

## 2023-10-14 MED ORDER — CHLORHEXIDINE GLUCONATE CLOTH 2 % EX PADS
6.0000 | MEDICATED_PAD | Freq: Every day | CUTANEOUS | Status: DC
  Administered 2023-10-14 – 2023-10-19 (×5): 6 via TOPICAL

## 2023-10-14 MED ORDER — LISINOPRIL 10 MG PO TABS: 10.0000 mg | ORAL_TABLET | Freq: Every day | ORAL | Status: DC

## 2023-10-14 MED ORDER — TRAMADOL HCL 50 MG PO TABS
25.0000 mg | ORAL_TABLET | Freq: Four times a day (QID) | ORAL | Status: DC | PRN
  Administered 2023-10-14: 25 mg via ORAL
  Filled 2023-10-14: qty 1

## 2023-10-14 MED ORDER — FORMOTEROL FUMARATE 20 MCG/2ML IN NEBU: 20.0000 ug | INHALATION_SOLUTION | Freq: Two times a day (BID) | RESPIRATORY_TRACT | Status: DC

## 2023-10-14 NOTE — H&P (Addendum)
 History and Physical    Kristy Weeks FMW:[id number] DOB: 26-Feb-1950 DOA: 10/13/2023  Patient coming from: Home.  Chief Complaint: Coughing up blood.  HPI: Kristy Weeks is a 73 y.o. female with history of chronic respiratory failure on 4 L oxygen secondary to COPD with chronic cavitary lung lesion and bronchiectasis, CAD status post PCI last in December 2023, hypertension recently admitted in July 2025 for respiratory failure in the setting of COVID infection has been on prolonged antibiotics recently finished a course of doxycycline  and is on chronic steroid therapy for COPD presents to the ER with complaints of hemoptysis yesterday.  Patient states she has been coughing up blood with increasing shortness of breath over the last few hours before coming to the ER.  Denies any chest pain fever or chills.  ED Course: In the ER patient initially required 6 L oxygen but was soon back to her baseline 4 L.  Labs done showed hemoglobin of 12.  CT angiogram of the chest showed right segmental and subsegmental pulm embolism with no strain pattern also demonstrated large cavitary lesion with chronic consolidation.  Patient was started on heparin  infusion and admitted for further observation.  Review of Systems: As per HPI, rest all negative.   Past Medical History:  Diagnosis Date   COPD (chronic obstructive pulmonary disease) (HCC)    wears 2L most of the time   Coronary artery disease    Hypertension     Past Surgical History:  Procedure Laterality Date   CORONARY ANGIOPLASTY WITH STENT PLACEMENT       reports that she quit smoking about 7 years ago. Her smoking use included cigarettes. She started smoking about 47 years ago. She has a 40 pack-year smoking history. She has never used smokeless tobacco. She reports that she does not drink alcohol and does not use drugs.  Allergies  Allergen Reactions   Cefdinir Other (See Comments)    Abdominal pain   Hydrocodone -Acetaminophen  Other  (See Comments)    nausea    Family History  Problem Relation Age of Onset   Heart failure Father 79    Prior to Admission medications   Medication Sig Start Date End Date Taking? Authorizing Provider  acetaminophen  (TYLENOL ) 500 MG tablet Take 1,000 mg by mouth every 6 (six) hours as needed for moderate pain (pain score 4-6) or mild pain (pain score 1-3).    [provider]  albuterol  (PROVENTIL ) (2.5 MG/3ML) 0.083% nebulizer solution Take 2.5 mg by nebulization every 6 (six) hours as needed for wheezing or shortness of breath.    [provider]  albuterol  (VENTOLIN  HFA) 108 (90 Base) MCG/ACT inhaler Inhale 2 puffs into the lungs every 4 (four) hours as needed for wheezing or shortness of breath. 04/12/23   [provider]  ALPRAZolam  (XANAX ) 0.5 MG tablet Take 0.25-0.5 mg by mouth at bedtime. 07/07/23   [provider]  amoxicillin -clavulanate (AUGMENTIN ) 875-125 MG tablet Take 1 tablet by mouth 2 (two) times daily. 07/10/23   Pokhrel, Vernal, MD  aspirin  EC 81 MG tablet Take 81 mg by mouth daily.    [provider]  atorvastatin  (LIPITOR) 40 MG tablet Take 40 mg by mouth at bedtime.    [provider]  benzonatate (TESSALON) 100 MG capsule Take 100 mg by mouth 3 (three) times daily as needed for cough. 06/13/23   [provider]  budesonide  (PULMICORT ) 0.5 MG/2ML nebulizer solution USE 1 VIAL VIA NEBULIZER EVERY 12 HOURS    [provider]  carvedilol  (COREG ) 6.25 MG tablet Take 6.25 mg by mouth 2 (two) times daily with a meal.    [provider]  clopidogrel  (PLAVIX ) 75 MG tablet Take 75 mg by mouth daily.    [provider]  famotidine (PEPCID) 20 MG tablet Take 20 mg by mouth 2 (two) times daily as needed for heartburn. 06/01/23   [provider]  folic acid  (FOLVITE ) 1 MG tablet Take 1 mg by mouth daily. 03/13/22   [provider]  formoterol  (PERFOROMIST ) 20 MCG/2ML nebulizer solution  Take 20 mcg by nebulization 2 (two) times daily.    [provider]  ipratropium-albuterol  (DUONEB) 0.5-2.5 (3) MG/3ML SOLN Take 3 mLs by nebulization every 6 (six) hours as needed (for shortness of breath).     [provider]  lisinopril  (PRINIVIL ,ZESTRIL ) 10 MG tablet Take 10 mg by mouth at bedtime.    [provider]  nitroGLYCERIN  (NITROSTAT ) 0.4 MG SL tablet Place 0.4 mg under the tongue every 5 (five) minutes as needed for chest pain. 04/23/23   [provider]  predniSONE  (DELTASONE ) 10 MG tablet Take 4 tablets (40 mg) daily for 2 days, then, Take 3 tablets (30 mg) daily for 2 days, then, Take 2 tablets (20 mg) daily for 2 days, then, Take 1 tablets (10 mg) daily for 1 days, then stop 07/10/23   Pokhrel, Laxman, MD  predniSONE  (DELTASONE ) 5 MG tablet Take 1-2 tablets (5-10 mg total) by mouth See admin instructions. Take 5 mg by mouth on daily and then take 10 mg by mouth once daily the next day. Alternating doses. 07/18/23   Sonjia Held, MD    Physical Exam: Constitutional: Moderately built and nourished. Vitals:   10/14/23 0107 10/14/23 0200 10/14/23 0239 10/14/23 0348  BP: (!) 179/95 (!) 183/84 135/66   Pulse:  73 78   Resp:  18 16   Temp:    97.6 F (36.4 C)  TempSrc:    Oral  SpO2:  100% 100%   Weight: 48.9 kg     Height: 5' 4 (1.626 m)      Eyes: Anicteric no pallor. ENMT: No discharge from the ears eyes nose or mouth. Neck: No mass felt.  No neck rigidity. Respiratory: No rhonchi or crepitations. Cardiovascular: S1-S2 heard. Abdomen: Soft nontender bowel sound present. Musculoskeletal: No edema. Skin: No rash. Neurologic: Alert awake oriented time place and person.  Moves all extremities. Psychiatric: Appears normal.  Normal affect.   Labs on Admission: I have personally reviewed following labs and imaging studies  CBC: Recent Labs  Lab 10/13/23 1958  WBC 9.1  HGB 12.0  HCT 38.3  MCV 98.0  PLT 230   Basic Metabolic  Panel: Recent Labs  Lab 10/13/23 1958  NA 140  K 4.8  CL 98  CO2 33*  GLUCOSE 143*  BUN 20  CREATININE 0.64  CALCIUM  9.8   GFR: Estimated Creatinine Clearance: 47.6 mL/min (by C-G formula based on SCr of 0.64 mg/dL). Liver Function Tests: No results for input(s): AST, ALT, ALKPHOS, BILITOT, PROT, ALBUMIN in the last 168 hours. No results for input(s): LIPASE, AMYLASE in the last 168 hours. No results for input(s): AMMONIA in the last 168 hours. Coagulation Profile: No results for input(s): INR, PROTIME in the last 168 hours. Cardiac Enzymes: No results for input(s): CKTOTAL, CKMB, CKMBINDEX, TROPONINI in the last 168 hours. BNP (last 3 results) Recent Labs    07/07/23 1942  PROBNP 655.0*   HbA1C: No results for input(s): HGBA1C  in the last 72 hours. CBG: No results for input(s): GLUCAP in the last 168 hours. Lipid Profile: No results for input(s): CHOL, HDL, LDLCALC, TRIG, CHOLHDL, LDLDIRECT in the last 72 hours. Thyroid Function Tests: No results for input(s): TSH, T4TOTAL, FREET4, T3FREE, THYROIDAB in the last 72 hours. Anemia Panel: No results for input(s): VITAMINB12, FOLATE, FERRITIN, TIBC, IRON, RETICCTPCT in the last 72 hours. Urine analysis:    Component Value Date/Time   COLORURINE YELLOW 02/16/2023 2307   APPEARANCEUR CLEAR 02/16/2023 2307   LABSPEC 1.020 02/16/2023 2307   PHURINE 6.0 02/16/2023 2307   GLUCOSEU NEGATIVE 02/16/2023 2307   HGBUR NEGATIVE 02/16/2023 2307   BILIRUBINUR NEGATIVE 02/16/2023 2307   KETONESUR 15 (A) 02/16/2023 2307   PROTEINUR NEGATIVE 02/16/2023 2307   NITRITE NEGATIVE 02/16/2023 2307   LEUKOCYTESUR NEGATIVE 02/16/2023 2307   Sepsis Labs: @LABRCNTIP (procalcitonin:4,lacticidven:4) ) Recent Results (from the past 240 hours)  MRSA Next Gen by PCR, Nasal     Status: None   Collection Time: 10/14/23  1:02 AM   Specimen: Nasal Mucosa; Nasal Swab  Result Value  Ref Range Status   MRSA by PCR Next Gen NOT DETECTED NOT DETECTED Final    Comment: (NOTE) The GeneXpert MRSA Assay (FDA approved for NASAL specimens only), is one component of a comprehensive MRSA colonization surveillance program. It is not intended to diagnose MRSA infection nor to guide or monitor treatment for MRSA infections. Test performance is not FDA approved in patients less than 54 years old. Performed at [facility], [address], [place], [place] [address]      Radiological Exams on Admission: CT Angio Chest PE W and/or Wo Contrast Result Date: 10/13/2023 CLINICAL DATA:  Pulmonary embolism (PE) suspected, high prob hemopytosis c/o hemoptysis x 3 hours. Increased O2 demands. Family reports persistent congested cough since July EXAM: CT ANGIOGRAPHY CHEST WITH CONTRAST TECHNIQUE: Multidetector CT imaging of the chest was performed using the standard protocol during bolus administration of intravenous contrast. Multiplanar CT image reconstructions and MIPs were obtained to evaluate the vascular anatomy. RADIATION DOSE REDUCTION: This exam was performed according to the departmental dose-optimization program which includes automated exposure control, adjustment of the mA and/or kV according to patient size and/or use of iterative reconstruction technique. CONTRAST:  70mL OMNIPAQUE  IOHEXOL  350 MG/ML SOLN COMPARISON:  CT chest 03/16/2023, CT angio chest 01/19/2023 FINDINGS: Cardiovascular: Satisfactory opacification of the pulmonary arteries to the segmental level. Interval development of a right lower lobe segmental and subsegmental pulmonary embolus (5:94, 7:93). Abrupt cut off of a right upper lobe pulmonary artery again noted due to large cavitary mass. The left and right main pulmonary arteries are enlarged in size. Normal heart size. No significant pericardial effusion. The ascending thoracic aorta is normal in caliber. The descending thoracic aorta is enlarged in  caliber measuring up to 3.7 cm-stable. Severe atherosclerotic plaque of the thoracic aorta. Four-vessel coronary artery calcifications. Mediastinum/Nodes: No enlarged mediastinal, hilar, or axillary lymph nodes. Thyroid gland, trachea, and esophagus demonstrate no significant findings. Lungs/Pleura: Right lung volume loss. Severe emphysematous changes. Redemonstration of a large cavitary mass at the right apex measuring approximately 10 x 6 cm with interval slight decrease in internal layering fluid. Right upper lobe bronchiectasis. Similar-appearing right lower lobe consolidation that extends superiorly in use with the right upper lobe cavitary mass. Redemonstration of patchy nodular like airspace opacities within the right lower lobe and right middle lobe. No pleural effusion. No pneumothorax. 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 Aneurysmal partially visualized infrarenal abdominal aorta. Atrophic right kidney. 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 Musculoskeletal: No chest wall abnormality. No suspicious lytic or blastic osseous lesions. No acute displaced fracture. Multilevel degenerative changes of the spine. Review of the MIP images confirms the above findings. IMPRESSION: 1. Interval development of a right lower lobe segmental and subsegmental pulmonary embolus. No associated right heart strain or pulmonary infarction. 2. Redemonstration of large cavitary right upper lobe lesion that is continuous with a chronic right lower lobe consolidation and surrounding satellite nodular airspace opacities in the right lower lobe and right middle lobes. No significant change. Finding may be infectious or malignant in etiology. 3. Emphysema (ICD10-J43.9)-severe. 4. Aortic Atherosclerosis (ICD10-I70.0) -severe including four-vessel coronary artery calcification. 5. 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 6. 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 Aneurysmal partially visualized infrarenal abdominal aorta. Consider outpatient CTA abdomen pelvis for further evaluation. 7. 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 8. Atrophic right kidney. Electronically Signed   By: Morgane  Naveau M.D.   On: 10/13/2023 22:14    EKG: Independently reviewed.  Normal sinus rhythm with nonspecific changes.  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  Closely monitor for any worsening of hemoptysis.  If patient remains stable then may transition to oral anticoagulants.  Check 2D echo troponins BNP. Hemoptysis with known history of large cavitary lung lesion and bronchiectasis and MAC.  Closely monitor for any worsening.  Will consult pulmonologist if there is further episodes of hemoptysis. CAD status post PCI last in December 2023.  Takes aspirin  Plavix  statins and beta-blockers. COPD on 4 L oxygen not wheezing.  Continue home nebulizers.  On chronic prednisone  therapy.  Patient was recently admitted in July 2025 for COPD exacerbation in the setting of COVID infection.  Was on antibiotics for until recently. Hypertension on Coreg  and lisinopril .  Since patient has pulm embolism with hemoptysis will need close monitoring further workup and more than 2 midnight stay.   DVT prophylaxis: Heparin  infusion. Code Status: Full code confirmed with patient. Family Communication: Discussed with patient. Disposition Plan: Monitored bed. Consults called: None. Admission status: Inpatient.

## 2023-10-14 NOTE — Progress Notes (Signed)
 PHARMACY - ANTICOAGULATION CONSULT NOTE  Pharmacy Consult for Heparin  Indication: pulmonary embolus  Allergies  Allergen Reactions   Cefdinir Other (See Comments)    Abdominal pain   Hydrocodone -Acetaminophen  Other (See Comments)    nausea    Patient Measurements: Height: 5' 4 (162.6 cm) Weight: 48.9 kg (107 lb 12.9 oz) IBW/kg (Calculated) : 54.7 HEPARIN  DW (KG): 48.9  Vital Signs: Temp: 97.5 F (36.4 C) (09/17 0650) Temp Source: Oral (09/17 0650) BP: 131/70 (09/17 0650) Pulse Rate: 72 (09/17 0650)  Labs: Recent Labs    10/13/23 1958 10/14/23 0325 10/14/23 0356 10/14/23 0632  HGB 12.0 11.3* DUP PER JAMES PA @0434  TW  --   HCT 38.3 38.9 DUP PER JAMES PA @0434  TW  --   PLT 230 204 DUP PER JAMES PA @0434  TW  --   HEPARINUNFRC  --   --   --  0.38  CREATININE 0.64 0.56  --   --     Estimated Creatinine Clearance: 47.6 mL/min (by C-G formula based on SCr of 0.56 mg/dL).   Medical History: Past Medical History:  Diagnosis Date   COPD (chronic obstructive pulmonary disease) (HCC)    wears 2L most of the time   Coronary artery disease    Hypertension     Medications:  Infusions:   heparin  800 Units/hr (10/14/23 9341)    Assessment: 38 yoF presenting with SOB and hemoptysis. Pt has no previous history of DVT or PE. CTA on 9/16 reveals PE with no associated right heart strain. Pt with history of CAD and on Plavix . Pt has no history of oral anticoagulant use.   Today, 10/14/2023:  Heparin  level 0.38, therapeutic on heparin  800 units/hr CBC: Hgb decreased to 11.3 (baseline 10-11), PLT WNL No bleeding or complications reported by RN.  No reported hemoptysis overnight.   Goal of Therapy:  Heparin  level 0.3-0.7 units/ml Monitor platelets by anticoagulation protocol: Yes   Plan:  Continue heparin  IV infusion at 800 units/hr Confirmatory heparin  level in 8 hours Daily heparin  level and CBC Follow up plans for long-term anticoagulation.     Wanda Hasting  PharmD, BCPS [facility] main pharmacy [phone] 10/14/2023 7:05 AM

## 2023-10-14 NOTE — Progress Notes (Signed)
 PHARMACY - ANTICOAGULATION CONSULT NOTE  Pharmacy Consult for Heparin  Indication: pulmonary embolus  Allergies  Allergen Reactions   Cefdinir Other (See Comments)    Abdominal pain   Hydrocodone -Acetaminophen  Nausea Only    Patient Measurements: Height: 5' 4 (162.6 cm) Weight: 48.9 kg (107 lb 12.9 oz) IBW/kg (Calculated) : 54.7 HEPARIN  DW (KG): 48.9  Vital Signs: Temp: 97.7 F (36.5 C) (09/17 1200) Temp Source: Oral (09/17 1200) BP: 165/77 (09/17 1400) Pulse Rate: 85 (09/17 1400)  Labs: Recent Labs    10/13/23 1958 10/14/23 0325 10/14/23 0356 10/14/23 0632 10/14/23 1502  HGB 12.0 11.3* DUP PER JAMES PA @0434  TW 11.7*  --   HCT 38.3 38.9 DUP PER JAMES PA @0434  TW 39.6  --   PLT 230 204 DUP PER JAMES PA @0434  TW 201  --   HEPARINUNFRC  --   --   --  0.38 0.34  CREATININE 0.64 0.56  --   --   --     Estimated Creatinine Clearance: 47.6 mL/min (by C-G formula based on SCr of 0.56 mg/dL).   Medical History: Past Medical History:  Diagnosis Date   COPD (chronic obstructive pulmonary disease) (HCC)    wears 2L most of the time   Coronary artery disease    Hypertension    Assessment: 16 yoF presenting with SOB and hemoptysis. Pt has no previous history of DVT or PE. CTA on 9/16 reveals PE with no associated right heart strain. Pt with history of CAD and on Plavix . Pt has no history of oral anticoagulant use.   Today, 10/14/2023:  Heparin  level remains therapeutic, although approaching borderline low, on 800 units/hr CBC: Hgb decreased to 11.3 (baseline 10-11), PLT WNL No bleeding or complications reported by RN.  No reported hemoptysis overnight  Goal of Therapy:  Heparin  level 0.3-0.7 units/ml Monitor platelets by anticoagulation protocol: Yes   Plan:  Slightly increase heparin  IV infusion to 850 units/hr Check confirmatory heparin  level with AM labs Daily heparin  level and CBC Follow up plans for long-term anticoagulation  Bard Jeans, PharmD,  BCPS [phone] 10/14/2023, 5:12 PM

## 2023-10-14 NOTE — Consult Note (Addendum)
 NAME:  Kristy Weeks, MRN:  [id number], DOB:  02-10-1950, LOS: 0 ADMISSION DATE:  10/13/2023, CONSULTATION DATE:  10/14/2023 REFERRING MD:  Dr. 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 CT PE protocol demonstrated right lower lobe segmental and subsegmental pulmonary emboli without associated right heart strain. CT chest showed extensive chronic changes throughout right lung including right upper lobe cavity, bronchiectasis, and mucous plugging, findings stable from prior study and consistent with chronic MAI. PCCM was consulted for further management of PE and hemoptysis.   She began coughing up blood yesterday afternoon. She describes the blood as bright red and saturating through multiple tissues, she said it may be about 1/3 of a cup. She has not had blood in her sputum before. She also has had intermittent chest pain and hotness around her back that comes and goes. It does not seem associated with taking a deep breath. She has a chronic cough which has not changed. She denies infectious symptoms since her recent COVID infection. She takes ASA and Plavix  at home and had PCI with stenting most recently in 12/2021.   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 She did have some more blood in her sputum this morning. Her breathing is at her baseline and she otherwise feels well.   Objective    Blood pressure 131/70, pulse 72, temperature (!) 97.5 F (36.4 C), temperature source Oral, resp. rate 17, height 5' 4 (1.626 m), weight 48.9 kg, SpO2 100%.        Intake/Output Summary (Last 24 hours) at 10/14/2023 0943 Last data filed at 10/14/2023 9341 Gross per 24 hour  Intake 93.22 ml  Output --  Net 93.22 ml   Filed Weights   10/13/23 1931 10/14/23 0107  Weight: 49 kg 48.9 kg    Examination: General: chronically ill appearing female, no acute distress HENT: sclera anicteric, normocephalic, atraumatic Lungs: diminished throughout, breathing comfortably on baseline 4L nasal cannula Cardiovascular: normal sinus rhythm, normal S1 and S2, no m/r/g  Abdomen: soft, non-tender, non-distended Extremities: warm, dry, palpable pedal pulses  Neuro: alert and oriented x 3, non-focal GU: deferred  Resolved problem list   Assessment and Plan   Right lower lobe segmental and subsegmental PE Most likely provoked in the setting of recent COVID infection. No known cancer or immobility. On home baseline 4L oxygen with SpO2 in the mid-90's. Hemodynamically stable with no evidence of RV strain on CT PE. PESI score 84, low risk.  - Continue heparin  infusion, continue to monitor hemoptysis on heparin  infusion for at least 48 hours before considering transitioning to DOAC. Low threshold to stop heparin  infusion  should patient become hemodynamically unstable or have increasing volume of hemoptysis with associated drop in hemoglobin  - Follow up bilateral lower extremity duplexes - Follow up Echo - Recommend cardiology consult to  discuss necessity of dual anti-platelet therapy with ASA and Plavix   Hemoptysis  Anemia Unclear etiology. Most likely in the setting of chronic MAI infection with RUL cavitation versus PE. Baseline Hgb ~12 and remains close to baseline at 11.7.  - Consider inhaled TXA if worsening - Correction of coagulopathy as indicated - If ongoing hemoptysis or hemodynamically significant consider IR consult for bronchial artery embolization  - Continue to trend CBC  Chronic hypoxic respiratory failure due to COPD Chronic MAI infection with RUL cavitation  On baseline 4L oxygen. CT chest demonstrates stable disease from prior study with no new enlarging pulmonary nodule or acute pulmonary infiltrate. No evidence of COPD exacerbation.  - Continue home prednisone  - Continue home Incruse ellipta , budenoside and arformoterol   - PRN Duo-nebs  CAD s/p PCI (most recent 12/2021) On ASA and Plavix  at home. - Recommend cardiology consult per above  HTN - Per primary   Labs   CBC: Recent Labs  Lab 10/13/23 1958 10/14/23 0325 10/14/23 0356 10/14/23 0632  WBC 9.1 7.5 DUP PER JAMES PA @0434  TW 8.5  NEUTROABS  --  4.8 PENDING  --   HGB 12.0 11.3* DUP PER JAMES PA @0434  TW 11.7*  HCT 38.3 38.9 DUP PER JAMES PA @0434  TW 39.6  MCV 98.0 101.0* DUP PER JAMES PA @0434  TW 102.9*  PLT 230 204 DUP PER JAMES PA @0434  TW 201    Basic Metabolic Panel: Recent Labs  Lab 10/13/23 1958 10/14/23 0325  NA 140 141  K 4.8 3.9  CL 98 97*  CO2 33* 33*  GLUCOSE 143* 125*  BUN 20 19  CREATININE 0.64 0.56  CALCIUM  9.8 9.5   GFR: Estimated Creatinine Clearance: 47.6 mL/min (by C-G formula based on SCr of 0.56 mg/dL). Recent Labs  Lab 10/13/23 1958 10/14/23 0325 10/14/23 0356 10/14/23 0632  WBC 9.1 7.5 DUP PER JAMES PA @0434  TW 8.5    Liver Function Tests: Recent Labs  Lab 10/14/23 0447  AST 20  ALT 9  ALKPHOS 52  BILITOT 0.4  PROT 6.1*  ALBUMIN 3.9   No results for input(s): LIPASE, AMYLASE  in the last 168 hours. No results for input(s): AMMONIA in the last 168 hours.  ABG    Component Value Date/Time   HCO3 35.9 (H) 07/07/2023 1956   TCO2 38 (H) 07/07/2023 1956   O2SAT 97 07/07/2023 1956     Coagulation Profile: No results for input(s): INR, PROTIME in the last 168 hours.  Cardiac Enzymes: No results for input(s): CKTOTAL, CKMB, CKMBINDEX, TROPONINI in the last 168 hours.  HbA1C: Hgb A1c MFr Bld  Date/Time Value Ref Range Status  01/26/2018 11:21 AM 5.4 4.8 - 5.6 % Final    Comment:    (NOTE) Pre diabetes:          5.7%-6.4% Diabetes:              >6.4% Glycemic control for   <7.0% adults with diabetes     CBG: No results for input(s): GLUCAP in the last 168 hours.  Review of Systems:   Review of Systems  Constitutional:  Negative for chills and fever.  HENT:  Negative for congestion and sore throat.   Respiratory:  Positive for cough, hemoptysis, sputum production and shortness of breath.   Cardiovascular:  Positive  for chest pain. Negative for leg swelling.  Musculoskeletal:  Positive for back pain.  Neurological:  Negative for dizziness.   Past Medical History:  She,  has a past medical history of COPD (chronic obstructive pulmonary disease) (HCC), Coronary artery disease, and Hypertension.   Surgical History:   Past Surgical History:  Procedure Laterality Date   CORONARY ANGIOPLASTY WITH STENT PLACEMENT       Social History:   reports that she quit smoking about 7 years ago. Her smoking use included cigarettes. She started smoking about 47 years ago. She has a 40 pack-year smoking history. She has never used smokeless tobacco. She reports that she does not drink alcohol and does not use drugs.   Family History:  Her family history includes Heart failure (age of onset: 61) in her father.   Allergies Allergies  Allergen Reactions   Cefdinir Other (See Comments)    Abdominal pain   Hydrocodone -Acetaminophen  Other (See  Comments)    nausea     Home Medications  Prior to Admission medications   Medication Sig Start Date End Date Taking? Authorizing Provider  acetaminophen  (TYLENOL ) 500 MG tablet Take 1,000 mg by mouth every 6 (six) hours as needed for moderate pain (pain score 4-6) or mild pain (pain score 1-3).    [provider]  albuterol  (PROVENTIL ) (2.5 MG/3ML) 0.083% nebulizer solution Take 2.5 mg by nebulization every 6 (six) hours as needed for wheezing or shortness of breath.    [provider]  albuterol  (VENTOLIN  HFA) 108 (90 Base) MCG/ACT inhaler Inhale 2 puffs into the lungs every 4 (four) hours as needed for wheezing or shortness of breath. 04/12/23   [provider]  ALPRAZolam  (XANAX ) 0.5 MG tablet Take 0.25-0.5 mg by mouth at bedtime. 07/07/23   [provider]  amoxicillin -clavulanate (AUGMENTIN ) 875-125 MG tablet Take 1 tablet by mouth 2 (two) times daily. 07/10/23   Pokhrel, Vernal, MD  aspirin  EC 81 MG tablet Take 81 mg by mouth daily.    [provider]  atorvastatin  (LIPITOR) 40 MG tablet Take 40 mg by mouth at bedtime.    [provider]  benzonatate (TESSALON) 100 MG capsule Take 100 mg by mouth 3 (three) times daily as needed for cough. 06/13/23   [provider]  budesonide  (PULMICORT ) 0.5 MG/2ML nebulizer solution USE 1 VIAL VIA NEBULIZER EVERY 12 HOURS    [provider]  carvedilol  (COREG ) 6.25 MG tablet Take 6.25 mg by mouth 2 (two) times daily with a meal.    [provider]  clopidogrel  (PLAVIX ) 75 MG tablet Take 75 mg by mouth daily.    [provider]  famotidine (PEPCID) 20 MG tablet Take 20 mg by mouth 2 (two) times daily as needed for heartburn. 06/01/23   [provider]  folic acid  (FOLVITE ) 1 MG tablet Take 1 mg by mouth daily. 03/13/22   [provider]  formoterol  (PERFOROMIST ) 20 MCG/2ML nebulizer solution Take 20 mcg by nebulization 2 (two) times daily.    [provider]  ipratropium-albuterol  (DUONEB) 0.5-2.5 (3) MG/3ML SOLN Take 3 mLs by nebulization every 6 (six) hours as needed (for shortness of breath).     [provider]  lisinopril  (PRINIVIL ,ZESTRIL ) 10 MG tablet Take 10 mg by mouth at bedtime.    [provider]  nitroGLYCERIN  (NITROSTAT ) 0.4 MG SL tablet Place 0.4 mg under the tongue every 5 (five) minutes as needed for chest pain. 04/23/23   [provider]  predniSONE  (  DELTASONE ) 10 MG tablet Take 4 tablets (40 mg) daily for 2 days, then, Take 3 tablets (30 mg) daily for 2 days, then, Take 2 tablets (20 mg) daily for 2 days, then, Take 1 tablets (10 mg) daily for 1 days, then stop 07/10/23   Pokhrel, Laxman, MD  predniSONE  (DELTASONE ) 5 MG tablet Take 1-2 tablets (5-10 mg total) by mouth See admin instructions. Take 5 mg by mouth on daily and then take 10 mg by mouth once daily the next day. Alternating doses. 07/18/23   Sonjia Held, MD         Rexene LOISE Blush, PA-C [facility] 10/14/23 10:25 AM  Please see [email or web address] for pager details.  From 7A-7P if no response, please call [phone] After hours, please call ELink [phone]

## 2023-10-14 NOTE — Progress Notes (Signed)
 Bilateral lower extremity venous duplex has been completed. Preliminary results can be found in CV Proc through chart review.   10/14/23 11:59 AM Cathlyn Collet RVT

## 2023-10-14 NOTE — Plan of Care (Signed)

## 2023-10-14 NOTE — ED Notes (Signed)
 [facility] on site now ready to transfer pt.

## 2023-10-14 NOTE — Progress Notes (Signed)
 Echocardiogram 2D Echocardiogram has been performed.  Tinnie FORBES Gosling RDCS 10/14/2023, 9:30 AM

## 2023-10-14 NOTE — Consult Note (Addendum)
 Cardiology Consultation   Patient ID: Lakyla Biswas MRN: [id number]; DOB: 18-Aug-1950  Admit date: 10/13/2023 Date of Consult: 10/14/2023  PCP:  [name], PA-C   [facility] Providers Cardiologist:  None        Patient Profile: Kristy Weeks is a 73 y.o. female with a history of severe multivessel CAD s/p multiple PCIs (most recently DES to LAD in 12/2021), PVCs, severe COPD with chronic hypoxic respiratory failure on 4L of O2 on chronic antibiotics and steroids, chronic cavitary lung lesion, hypertension, hyperlipidemia, and prior tobacco abuse (quit in 2018) who is being seen 10/14/2023 for evaluation of antiplatelet therapy in setting of newly diagnosed PE at the request of Dr. Madelyne.  History of Present Illness: Ms. Brisbane is a 73 year old female with the above history who is followed by Cardiology and Pulmonology at [facility].  Patient has a long history of CAD dating back to at least 2006.  She underwent remote DES to RCA in 2006.  LHC in 10/2021 showed severe three-vessel CAD with CTO of proximal RCA, CTO of mid LCx, and tandem severe stenosis of mid LAD.  She was not felt to be a candidate for CABG due to her severe COPD.  She subsequently underwent successful PCI with DES to tandem LAD lesions in 12/2021.  She was advised to continue DAPT indefinitely at that time.  She was last seen by Cardiology at [facility] in 03/2023 at which time she was doing well from a cardiac standpoint.  She also has severe COPD and is on 4L of O2 at baseline as well as chronic antibiotics and steroids. She has had multiple hospitalization for COPD exacerbations over the last few months (05/2023, 06/2023, and 07/2023).  Echo during admission in 06/2023 showed LVEF of 50-55% with akinesis of the basal inferior wall and grade 2 diastolic dysfunction, normal RV size and function, mild AI, and mildly elevated PASP.  Last hospitalization at [facility] in  07/2023 was also complicated by pneumonia secondary to COVID-19.  She presented to the [facility] ED on 10/13/2023 for hemoptysis as well as intermittent low back pain. EKG showed normal sinus rhythm, 85 bpm, with inferior Q waves and T wave inversions as well as mild ST depressions in leads V4-V6.  High-sensitivity troponin T minimally elevated and flat at 31 >> 31 consistent with demand ischemia.  Pro BNP 995.  Chest CTA showed a right lower lobe segmental and subsegmental PE with no associated right heart strain as well as a stable large cavitary right upper lobe lesion that is continuous with a chronic right lower lobe consolidation and surrounding satellite nodular airspace opacities in the right lower lobe and right middle lobes. WBC 7.5, Hgb 11.3, Plts 204. Na 141, K 3.9, Glucose 125, BUN 19, Cr 0.56.  She was admitted for an acute PE and started on IV Heparin .  Cardiology was consulted to assist with recommendations regarding DAPT given she will now need anticoagulation for PE.  Patient states she developed sudden onset of hemoptysis yesterday which is why she came to the ED. She has chronic shortness of breath due to her COPD but denies any worsening dyspnea compared to her baseline. She chronically sleeps on about a 30 degree incline but no new or worsening orthopnea. No PND or edema. No chest pain. No palpitations. She reports some occasional lightheadedness if she stands too quickly but no syncope. No other abnormal bleeding - no hematemesis, hematuria, melena, or hematochezia.   Past  Medical History:  Diagnosis Date   COPD (chronic obstructive pulmonary disease) (HCC)    wears 2L most of the time   Coronary artery disease    Hypertension     Past Surgical History:  Procedure Laterality Date   CORONARY ANGIOPLASTY WITH STENT PLACEMENT       Home Medications:  Prior to Admission medications   Medication Sig Start Date End Date Taking? Authorizing Provider  acetaminophen  (TYLENOL ) 500  MG tablet Take 1,000 mg by mouth every 6 (six) hours as needed for moderate pain (pain score 4-6) or mild pain (pain score 1-3).    [provider]  albuterol  (PROVENTIL ) (2.5 MG/3ML) 0.083% nebulizer solution Take 2.5 mg by nebulization every 6 (six) hours as needed for wheezing or shortness of breath.    [provider]  albuterol  (VENTOLIN  HFA) 108 (90 Base) MCG/ACT inhaler Inhale 2 puffs into the lungs every 4 (four) hours as needed for wheezing or shortness of breath. 04/12/23   [provider]  ALPRAZolam  (XANAX ) 0.5 MG tablet Take 0.25-0.5 mg by mouth at bedtime. 07/07/23   [provider]  amoxicillin -clavulanate (AUGMENTIN ) 875-125 MG tablet Take 1 tablet by mouth 2 (two) times daily. 07/10/23   Pokhrel, Laxman, MD  aspirin  EC 81 MG tablet Take 81 mg by mouth daily.    [provider]  atorvastatin  (LIPITOR) 40 MG tablet Take 40 mg by mouth at bedtime.    [provider]  benzonatate (TESSALON) 100 MG capsule Take 100 mg by mouth 3 (three) times daily as needed for cough. 06/13/23   [provider]  budesonide  (PULMICORT ) 0.5 MG/2ML nebulizer solution USE 1 VIAL VIA NEBULIZER EVERY 12 HOURS    [provider]  carvedilol  (COREG ) 6.25 MG tablet Take 6.25 mg by mouth 2 (two) times daily with a meal.    [provider]  clopidogrel  (PLAVIX ) 75 MG tablet Take 75 mg by mouth daily.    [provider]  famotidine (PEPCID) 20 MG tablet Take 20 mg by mouth 2 (two) times daily as needed for heartburn. 06/01/23   [provider]  folic acid  (FOLVITE ) 1 MG tablet Take 1 mg by mouth daily. 03/13/22   [provider]  formoterol  (PERFOROMIST ) 20 MCG/2ML nebulizer solution Take 20 mcg by nebulization 2 (two) times daily.    [provider]  ipratropium-albuterol  (DUONEB) 0.5-2.5 (3) MG/3ML SOLN Take 3 mLs by nebulization every 6 (six) hours as needed (for shortness of breath).     [provider]  lisinopril  (PRINIVIL ,ZESTRIL ) 10 MG tablet Take 10 mg by mouth at bedtime.    [provider]  nitroGLYCERIN  (NITROSTAT ) 0.4 MG SL tablet Place 0.4 mg under the tongue every 5 (five) minutes as needed for chest pain. 04/23/23   [provider]  predniSONE  (DELTASONE ) 10 MG tablet Take 4 tablets (40 mg) daily for 2 days, then, Take 3 tablets (30 mg) daily for 2 days, then, Take 2 tablets (20 mg) daily for 2 days, then, Take 1 tablets (10 mg) daily for 1 days, then stop 07/10/23   Pokhrel, Laxman, MD  predniSONE  (DELTASONE ) 5 MG tablet Take 1-2 tablets (5-10 mg total) by mouth See admin instructions. Take 5 mg by mouth on daily and then take 10 mg by mouth once daily the next day. Alternating doses. 07/18/23   Pokhrel, Laxman, MD    Scheduled Meds:  albuterol   2.5 mg Nebulization Q6H   ALPRAZolam   0.25-0.5 mg Oral QHS  arformoterol   15 mcg Nebulization BID   aspirin  EC  81 mg Oral Daily   atorvastatin   40 mg Oral QHS   budesonide   0.5 mg Nebulization BID   carvedilol   6.25 mg Oral BID WC   Chlorhexidine  Gluconate Cloth  6 each Topical Daily   clopidogrel   75 mg Oral Daily   folic acid   1 mg Oral Daily   guaiFENesin   1,200 mg Oral BID   predniSONE   10 mg Oral Q breakfast   umeclidinium bromide   1 puff Inhalation Daily   Continuous Infusions:  heparin  800 Units/hr (10/14/23 0658)   PRN Meds: acetaminophen  **OR** acetaminophen , acetaminophen , hydrALAZINE , ipratropium-albuterol , nitroGLYCERIN   Allergies:    Allergies  Allergen Reactions   Cefdinir Other (See Comments)    Abdominal pain   Hydrocodone -Acetaminophen  Other (See Comments)    nausea    Social History:   Social History   Socioeconomic History   Marital status: Single    Spouse name: Not on file   Number of children: Not on file   Years of education: Not on file   Highest education level: Not on file  Occupational History   Occupation: retired  Tobacco Use   Smoking status: Former     Current packs/day: 0.00    Average packs/day: 1 pack/day for 40.0 years (40.0 ttl pk-yrs)    Types: Cigarettes    Start date: 75    Quit date: 2018    Years since quitting: 7.7   Smokeless tobacco: Never  Vaping Use   Vaping status: Never Used  Substance and Sexual Activity   Alcohol use: No   Drug use: No   Sexual activity: Not Currently  Other Topics Concern   Not on file  Social History Narrative   Not on file   Social Drivers of Health   Financial Resource Strain: Not on file  Food Insecurity: No Food Insecurity (10/14/2023)   Hunger Vital Sign    Worried About Running Out of Food in the Last Year: Never true    Ran Out of Food in the Last Year: Never true  Transportation Needs: No Transportation Needs (10/14/2023)   PRAPARE - Administrator, Civil Service (Medical): No    Lack of Transportation (Non-Medical): No  Physical Activity: Not on file  Stress: Not on file  Social Connections: Socially Isolated (10/14/2023)   Social Connection and Isolation Panel    Frequency of Communication with Friends and Family: More than three times a week    Frequency of Social Gatherings with Friends and Family: More than three times a week    Attends Religious Services: Never    Database administrator or Organizations: No    Attends Banker Meetings: Never    Marital Status: Divorced  Catering manager Violence: Not At Risk (10/14/2023)   Humiliation, Afraid, Rape, and Kick questionnaire    Fear of Current or Ex-Partner: No    Emotionally Abused: No    Physically Abused: No    Sexually Abused: No    Family History:    Family History  Problem Relation Age of Onset   Heart failure Father 29   CAD Father    Heart attack Father 54 - 41   CAD Brother        s/p bypass in his 50s     ROS:  Please see the history of present illness.  All other ROS reviewed and negative.     Physical Exam/Data: Vitals:  10/14/23 0500 10/14/23 0650 10/14/23 0907  10/14/23 0920  BP: (!) 141/74 131/70    Pulse: 69 72    Resp: 14 17    Temp:  (!) 97.5 F (36.4 C)    TempSrc:  Oral    SpO2: 96% 100% 98% 100%  Weight:      Height:        Intake/Output Summary (Last 24 hours) at 10/14/2023 1112 Last data filed at 10/14/2023 0658 Gross per 24 hour  Intake 93.22 ml  Output --  Net 93.22 ml      10/14/2023    1:07 AM 10/13/2023    7:31 PM 07/07/2023    7:19 PM  Last 3 Weights  Weight (lbs) 107 lb 12.9 oz 108 lb 108 lb  Weight (kg) 48.9 kg 48.988 kg 48.988 kg     Body mass index is 18.5 kg/m.  General: 73 y.o. female resting comfortably in no acute distress. On 4L of O2 via nasal cannula. HEENT: Normocephalic and atraumatic. Sclera clear.  Neck: Supple. No JVD. Heart: RRR. No murmurs, gallops, or rubs.  Lungs: No increased work of breathing. Diminished breath sounds bilaterally. Rhonchi noted anteriorly but no wheezes or rales. Clear to ausculation bilaterally. No wheezes, rhonchi, or rales.  Extremities: No lower extremity edema.    Skin: Warm and dry. Neuro: Alert and oriented x3. No focal deficits. Psych: Normal affect. Responds appropriately.   EKG:  The EKG was personally reviewed and demonstrates:  Normal sinus rhythm, 85 bpm, with inferior Q waves and T wave inversions as well as mild ST depressions in leads V4-V6.   Telemetry:  Telemetry was personally reviewed and demonstrates:  Normal sinus rhythm with PACs/ PVCs. Rates in the 70s to 80s.  Relevant CV Studies:  Echocardiogram 07/08/2023: Impressions:  1. Left ventricular ejection fraction, by estimation, is 50 to 55%. The  left ventricle has low normal function. The left ventricle demonstrates  regional wall motion abnormalities (see scoring diagram/findings for  description). Left ventricular diastolic   parameters are consistent with Grade II diastolic dysfunction  (pseudonormalization). There is akinesis of the left ventricular, basal  inferior wall.   2. Right ventricular  systolic function is normal. The right ventricular  size is normal. There is mildly elevated pulmonary artery systolic  pressure.   3. Right atrial size was mildly dilated.   4. The mitral valve is grossly normal. Trivial mitral valve  regurgitation. No evidence of mitral stenosis.   5. The aortic valve was not well visualized. There is moderate  calcification of the aortic valve. Aortic valve regurgitation is mild.  Aortic valve sclerosis/calcification is present, without any evidence of  aortic stenosis.   6. The inferior vena cava is dilated in size with >50% respiratory  variability, suggesting right atrial pressure of 8 mmHg.    Laboratory Data: High Sensitivity Troponin:  No results for input(s): TROPONINIHS in the last 720 hours.   Chemistry Recent Labs  Lab 10/13/23 1958 10/14/23 0325  NA 140 141  K 4.8 3.9  CL 98 97*  CO2 33* 33*  GLUCOSE 143* 125*  BUN 20 19  CREATININE 0.64 0.56  CALCIUM  9.8 9.5  GFRNONAA >60 >60  ANIONGAP 9 11    Recent Labs  Lab 10/14/23 0447  PROT 6.1*  ALBUMIN 3.9  AST 20  ALT 9  ALKPHOS 52  BILITOT 0.4   Lipids No results for input(s): CHOL, TRIG, HDL, LABVLDL, LDLCALC, CHOLHDL in the last 168 hours.  Hematology Recent Labs  Lab 10/14/23 0325 10/14/23 0356 10/14/23 0632  WBC 7.5 DUP PER Konor Noren PA @0434  TW 8.5  RBC 3.85* DUP PER Geffrey Michaelsen PA @0434  TW 3.85*  HGB 11.3* DUP PER Terri Rorrer PA @0434  TW 11.7*  HCT 38.9 DUP PER Azari Hasler PA @0434  TW 39.6  MCV 101.0* DUP PER Andrey Mccaskill PA @0434  TW 102.9*  MCH 29.4 DUP PER Bettymae Yott PA @0434  TW 30.4  MCHC 29.0* DUP PER Nyisha Clippard PA @0434  TW 29.5*  RDW 12.7 DUP PER Zoe Creasman PA @0434  TW 12.6  PLT 204 DUP PER Margeaux Swantek PA @0434  TW 201   Thyroid No results for input(s): TSH, FREET4 in the last 168 hours.  BNP Recent Labs  Lab 10/14/23 0447  PROBNP 995.0*    DDimer No results for input(s): DDIMER in the last 168 hours.  Radiology/Studies:  CT Angio Chest PE W and/or Wo Contrast Result Date:  10/13/2023 CLINICAL DATA:  Pulmonary embolism (PE) suspected, high prob hemopytosis c/o hemoptysis x 3 hours. Increased O2 demands. Family reports persistent congested cough since July EXAM: CT ANGIOGRAPHY CHEST WITH CONTRAST TECHNIQUE: Multidetector CT imaging of the chest was performed using the standard protocol during bolus administration of intravenous contrast. Multiplanar CT image reconstructions and MIPs were obtained to evaluate the vascular anatomy. RADIATION DOSE REDUCTION: This exam was performed according to the departmental dose-optimization program which includes automated exposure control, adjustment of the mA and/or kV according to patient size and/or use of iterative reconstruction technique. CONTRAST:  70mL OMNIPAQUE  IOHEXOL  350 MG/ML SOLN COMPARISON:  CT chest 03/16/2023, CT angio chest 01/19/2023 FINDINGS: Cardiovascular: Satisfactory opacification of the pulmonary arteries to the segmental level. Interval development of a right lower lobe segmental and subsegmental pulmonary embolus (5:94, 7:93). Abrupt cut off of a right upper lobe pulmonary artery again noted due to large cavitary mass. The left and right main pulmonary arteries are enlarged in size. Normal heart size. No significant pericardial effusion. The ascending thoracic aorta is normal in caliber. The descending thoracic aorta is enlarged in caliber measuring up to 3.7 cm-stable. Severe atherosclerotic plaque of the thoracic aorta. Four-vessel coronary artery calcifications. Mediastinum/Nodes: No enlarged mediastinal, hilar, or axillary lymph nodes. Thyroid gland, trachea, and esophagus demonstrate no significant findings. Lungs/Pleura: Right lung volume loss. Severe emphysematous changes. Redemonstration of a large cavitary mass at the right apex measuring approximately 10 x 6 cm with interval slight decrease in internal layering fluid. Right upper lobe bronchiectasis. Similar-appearing right lower lobe consolidation that extends  superiorly in use with the right upper lobe cavitary mass. Redemonstration of patchy nodular like airspace opacities within the right lower lobe and right middle lobe. No pleural effusion. No pneumothorax. 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 Aneurysmal partially visualized infrarenal abdominal aorta. Atrophic right kidney. 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 Musculoskeletal: No chest wall abnormality. No suspicious lytic or blastic osseous lesions. No acute displaced fracture. Multilevel degenerative changes of the spine. Review of the MIP images confirms the above findings. IMPRESSION: 1. Interval development of a right lower lobe segmental and subsegmental pulmonary embolus. No associated right heart strain or pulmonary infarction. 2. Redemonstration of large cavitary right upper lobe lesion that is continuous with a chronic right lower lobe consolidation and surrounding satellite nodular airspace opacities in the right lower lobe and right middle lobes. No significant change. Finding may be infectious or malignant in etiology. 3. Emphysema (ICD10-J43.9)-severe. 4. Aortic Atherosclerosis (ICD10-I70.0) -severe including four-vessel coronary artery calcification. 5. 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 6.  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 Aneurysmal partially visualized infrarenal abdominal aorta. Consider outpatient CTA abdomen pelvis for further evaluation. 7. 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 8. Atrophic right kidney. Electronically Signed   By: Morgane  Naveau M.D.   On: 10/13/2023 22:14     Assessment and Plan:  CAD Patient has a long history of severe CAD with known CTO of of RCA and LCx.  Previously not felt to be a candidate for CABG given severe COPD.  Last PCI was DES to severe tandem lesions of the mid LAD.  Now admitted with a new PE.  EKG  shows no acute ischemic changes compared to prior tracings.  High-sensitivity troponin minimally elevated and flat at 31 >> 31 consistent with demand ischemia. - No chest pain. - Currently on DAPT with Aspirin  and Plavix . However, will need DOAC at discharge for PE.  She presented with hemoptysis in setting of PE and is currently on IV Heparin . She is still having some hemoptysis but hemodynamically stable. Plan is to make sure hemoptysis is stable on IV Heparin  for at least 48 hours prior to switching to DOAC. If she has worsening hemoptysis or becomes hemodynamically unstable, OK to stop both Aspirin  and Plavix . Will discuss long term antiplatelet recommendations while on DOAC (stopping both Aspirin  and Plavix  vs Aspirin  alone given severe CAD assuming hemoptysis resolves) with MD.  - Continue Lipitor 40mg  daily. - Recommend follow-up with primary Cardiologist after discharge. She states she already has a follow-up scheduled in the next couple of weeks.  Hemopytsis Acute PE Patient presented with hemoptysis and was found to have new PE.  - Echo and lower extremity venous dopplers pending. - Currently on IV Heparin .  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 PCCM has recommended continuing IV Heparin  for 48 hours to monitor hemoptysis before switching to DOAC.  Hypertension BP initially markedly elevated and as high as 180s/100s.  - BP has improved and is well controlled this morning. - Continue Coreg  6.25mg  twice daily. - Can restart home Lisinopril  10mg  daily as well if needed.  Otherwise, per primary team: - Severe COPD with chronic hypoxic respiratory failure - Chronic cavitary lesion - Hyperlipidemia  Risk Assessment/Risk Scores: N/A  For questions or updates, please contact [facility] Please consult [email or web address] for contact info under      Signed, Callie E Goodrich, PA-C  10/14/2023 11:12  AM  History and all data above reviewed.  I personally took the history today, performed the physical exam and formulated the substantive portion assessment and plan.  I reviewed all relevant tests and studies.  Patient very pleasant.  She lives her son.  She has 2 other sons.  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  She has got chronic O2 dependent COPD as above.  She gets around in the house but is fairly limited.  She developed some sharp pains in her back and presented with hemoptysis which she has never done before.  She says she gets infections not infrequently and hospitalization at [facility].  She has a history of coronary disease as described but she has not had any recent unstable symptoms.  She does not get chest pressure, neck or arm discomfort.  She does not ever have to take nitroglycerin .  She has had no fevers or chills.  She has had no new  PND or orthopnea.  She has had no weight gain.  She has not had pulmonary emboli.  She has cavitary lung lesion.  Patient examined.  I agree with the findings as above.  The patient exam reveals COR: Regular rate and rhythm, no murmurs,  Lungs: Decreased breath sounds bilaterally, no wheezing,  Abd: Positive bowel sounds normal in frequency pitch, bruits, rebound, guarding, Ext 2+ pulses, no edema.  All available labs, radiology testing, previous records reviewed. Agree with documented assessment and plan.  Acute pulmonary emboli: The patient is currently on heparin  and will need DOAC.  If she does not have resolution of her hemoptysis in the next couple of days she might need IR intervention to consider embolization as this might be related to the cavitary lesion.  If and when her hemoptysis is controlled I would restart Plavix  along with DOAC and not restart aspirin .  More recent data suggests DOAC alone might be reasonable but she is much higher risk apparently having chronic total occlusions of 2 of her vessels and  multiple stents in her LAD.  Hypertension: Blood pressures have been elevated and we will titrate meds.  Gaytha Raybourn  1:32 PM  10/14/2023

## 2023-10-14 NOTE — Progress Notes (Signed)
 PROGRESS NOTE    Kristy Weeks  FMW:[id number] DOB: 1950-02-09 DOA: 10/13/2023 PCP: [name], PA-C   Brief Narrative: 73 year old with past medical history significant for chronic respiratory failure on 4 L of oxygen secondary to COPD, chronic cavitary lung lesion and bronchiectasis, CAD status post PCI December 2023, hypertension, recent admission in July for respiratory failure in the setting of COVID, has been on prolonged antibiotics presents complaining of hemoptysis.  In the ED she required 6 L of oxygen but subsequently was able to transition down to 4 L.  CT show right segmental and subsegmental PE and demonstrated large cavitary lesion with chronic consolidation.     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  -CT angio chest:  Interval development of a right lower lobe segmental and subsegmental pulmonary embolus. Redemonstration of large cavitary right upper lobe lesion that is continuous with a chronic right lower lobe consolidation and surrounding satellite nodular airspace opacities in the right lower lobe and right middle lobes. No significant change. -Monitor hemoptysis on Heparin  gtt.  Pulmonologist consulted due to hemoptysis/  Check ECHO and LE doppler.  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  -Cardiology consulted, does patient needs both aspirin  and plavix ? Now she will be on eliquis and has Hemoptysis.   -Continue lipitor./   Chronic hypoxic  respiratory failure, on 4 L of oxygen at home, COPD -Continue pulmicort , brovana , Incruse, schedule albuterol .  -start Guaifenesin .  -Resume Azithromycin .  -Advised she will need close follow up with Pulmonologist, could benefit from repeat bronchoscopy bx due to recent PE>  -Continue prednisone .  -  Hypertension: -Continue carvedilol /       Estimated body mass index is 18.5 kg/m as calculated from the following:   Height as of this encounter: 5' 4 (1.626 m).   Weight as of this encounter: 48.9 kg.   DVT prophylaxis: heparin  gtt Code Status: full code Family Communication: Crae dicussed with patient.  Disposition Plan:  Status is: Inpatient Remains inpatient appropriate because: management of PE    Consultants:  Cardiology Pulmonologist   Procedures:  ECHO Doppler  Antimicrobials:    Subjective: She continue to have hemoptysis, like 1 tea spoon every 4 hours.  Report dyspnea stable. Continue to have productive cough.   Objective: Vitals:   10/14/23 0348 10/14/23 0400 10/14/23 0500 10/14/23 0650  BP:  129/72 (!) 141/74 131/70  Pulse:  72 69 72  Resp:  19 14 17   Temp: 97.6 F (36.4 C)   (!) 97.5 F (36.4 C)  TempSrc: Oral   Oral  SpO2:  99% 96% 100%  Weight:      Height:        Intake/Output Summary (Last 24 hours) at 10/14/2023 0659 Last data filed at 10/14/2023 0658 Gross per 24 hour  Intake 93.22 ml  Output --  Net 93.22 ml   Filed Weights   10/13/23 1931 10/14/23 0107  Weight: 49 kg 48.9 kg    Examination:  General exam: Appears calm and comfortable  Respiratory system: BL ronchus.  Respiratory effort normal. Cardiovascular system: S1 & S2 heard, RRR. No JVD, murmurs, rubs, gallops or clicks. No pedal edema. Gastrointestinal system: Abdomen is nondistended, soft and nontender. No organomegaly or masses felt. Normal bowel sounds heard. Central nervous system: Alert and oriented. No focal neurological deficits. Extremities: Symmetric 5 x 5  power.    Data Reviewed: I have personally reviewed following labs and imaging studies  CBC: Recent Labs  Lab 10/13/23 1958 10/14/23 0325 10/14/23 0356  WBC 9.1 7.5 DUP PER JAMES PA @0434  TW  NEUTROABS  --  4.8 PENDING  HGB 12.0 11.3* DUP PER JAMES PA @0434  TW  HCT 38.3 38.9 DUP PER JAMES PA @0434  TW  MCV 98.0 101.0* DUP PER JAMES PA @0434  TW  PLT 230 204 DUP PER JAMES PA @0434  TW   Basic Metabolic Panel: Recent Labs  Lab 10/13/23 1958 10/14/23 0325  NA 140 141  K 4.8 3.9  CL 98 97*  CO2 33* 33*  GLUCOSE 143* 125*  BUN 20 19  CREATININE 0.64 0.56  CALCIUM  9.8 9.5   GFR: Estimated Creatinine Clearance: 47.6 mL/min (by C-G formula based on SCr of 0.56 mg/dL). Liver Function Tests: Recent Labs  Lab 10/14/23 0447  AST 20  ALT 9  ALKPHOS 52  BILITOT 0.4  PROT 6.1*  ALBUMIN 3.9   No results for input(s): LIPASE, AMYLASE in the last 168 hours. No results for input(s): AMMONIA in the last 168 hours. Coagulation Profile: No results for input(s): INR, PROTIME in the last 168 hours. Cardiac Enzymes: No results for input(s): CKTOTAL, CKMB, CKMBINDEX, TROPONINI in the last 168 hours. BNP (last 3 results) Recent Labs    07/07/23 1942 10/14/23 0447  PROBNP 655.0* 995.0*   HbA1C: No results for input(s): HGBA1C in the last 72 hours. CBG: No results for input(s): GLUCAP in the last 168 hours. Lipid Profile: No results for input(s): CHOL, HDL, LDLCALC, TRIG, CHOLHDL, LDLDIRECT in the last 72 hours. Thyroid Function Tests: No results for input(s): TSH, T4TOTAL, FREET4, T3FREE, THYROIDAB in the last 72 hours. Anemia Panel: No results for input(s): VITAMINB12, FOLATE, FERRITIN, TIBC, IRON, RETICCTPCT in the last 72 hours. Sepsis Labs: No results for input(s): PROCALCITON, LATICACIDVEN in the last 168 hours.  Recent Results (from the past 240 hours)  MRSA Next Gen by PCR, Nasal     Status: None    Collection Time: 10/14/23  1:02 AM   Specimen: Nasal Mucosa; Nasal Swab  Result Value Ref Range Status   MRSA by PCR Next Gen NOT DETECTED NOT DETECTED Final    Comment: (NOTE) The GeneXpert MRSA Assay (FDA approved for NASAL specimens only), is one component of a comprehensive MRSA colonization surveillance program. It is not intended to diagnose MRSA infection nor to guide or monitor treatment for MRSA infections. Test performance is not FDA approved in patients less than 38 years old. Performed at [facility], [address], [place], [place] [address]          Radiology Studies: CT Angio Chest PE W and/or Wo Contrast Result Date: 10/13/2023 CLINICAL DATA:  Pulmonary embolism (PE) suspected, high prob hemopytosis c/o hemoptysis x 3 hours. Increased O2 demands. Family reports persistent congested cough since July EXAM: CT ANGIOGRAPHY CHEST WITH CONTRAST TECHNIQUE: Multidetector CT imaging of the chest was performed using the standard protocol during bolus administration of intravenous contrast. Multiplanar CT image reconstructions and MIPs were obtained to evaluate the vascular anatomy. RADIATION DOSE REDUCTION: This exam was performed according to the  departmental dose-optimization program which includes automated exposure control, adjustment of the mA and/or kV according to patient size and/or use of iterative reconstruction technique. CONTRAST:  70mL OMNIPAQUE  IOHEXOL  350 MG/ML SOLN COMPARISON:  CT chest 03/16/2023, CT angio chest 01/19/2023 FINDINGS: Cardiovascular: Satisfactory opacification of the pulmonary arteries to the segmental level. Interval development of a right lower lobe segmental and subsegmental pulmonary embolus (5:94, 7:93). Abrupt cut off of a right upper lobe pulmonary artery again noted due to large cavitary mass. The left and right main pulmonary arteries are enlarged in size. Normal heart size. No significant pericardial effusion. The ascending  thoracic aorta is normal in caliber. The descending thoracic aorta is enlarged in caliber measuring up to 3.7 cm-stable. Severe atherosclerotic plaque of the thoracic aorta. Four-vessel coronary artery calcifications. Mediastinum/Nodes: No enlarged mediastinal, hilar, or axillary lymph nodes. Thyroid gland, trachea, and esophagus demonstrate no significant findings. Lungs/Pleura: Right lung volume loss. Severe emphysematous changes. Redemonstration of a large cavitary mass at the right apex measuring approximately 10 x 6 cm with interval slight decrease in internal layering fluid. Right upper lobe bronchiectasis. Similar-appearing right lower lobe consolidation that extends superiorly in use with the right upper lobe cavitary mass. Redemonstration of patchy nodular like airspace opacities within the right lower lobe and right middle lobe. No pleural effusion. No pneumothorax. 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 Aneurysmal partially visualized infrarenal abdominal aorta. Atrophic right kidney. 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 Musculoskeletal: No chest wall abnormality. No suspicious lytic or blastic osseous lesions. No acute displaced fracture. Multilevel degenerative changes of the spine. Review of the MIP images confirms the above findings. IMPRESSION: 1. Interval development of a right lower lobe segmental and subsegmental pulmonary embolus. No associated right heart strain or pulmonary infarction. 2. Redemonstration of large cavitary right upper lobe lesion that is continuous with a chronic right lower lobe consolidation and surrounding satellite nodular airspace opacities in the right lower lobe and right middle lobes. No significant change. Finding may be infectious or malignant in etiology. 3. Emphysema (ICD10-J43.9)-severe. 4. Aortic Atherosclerosis (ICD10-I70.0) -severe including four-vessel coronary artery calcification. 5.  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 6. 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 Aneurysmal partially visualized infrarenal abdominal aorta. Consider outpatient CTA abdomen pelvis for further evaluation. 7. 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 8. Atrophic right kidney. Electronically Signed   By: Morgane  Naveau M.D.   On: 10/13/2023 22:14        Scheduled Meds:  arformoterol   15 mcg Nebulization BID   aspirin  EC  81 mg Oral Daily   atorvastatin   40 mg Oral QHS   budesonide   0.5 mg Nebulization BID   carvedilol   6.25 mg Oral BID WC   Chlorhexidine  Gluconate Cloth  6 each Topical Daily   clopidogrel   75 mg Oral Daily   folic acid   1 mg Oral Daily   predniSONE   10 mg Oral Q breakfast   umeclidinium bromide   1 puff Inhalation Daily   Continuous Infusions:  heparin  800 Units/hr (10/14/23 0658)     LOS: 0 days    Time spent: 35 minutes    Katyana Trolinger A Elaf Clauson, MD [facility]   If 7PM-7AM, please contact night-coverage [email or web address]  10/14/2023, 6:59 AM

## 2023-10-15 DIAGNOSIS — R042 Hemoptysis: Secondary | ICD-10-CM | POA: Diagnosis not present

## 2023-10-15 DIAGNOSIS — J432 Centrilobular emphysema: Secondary | ICD-10-CM | POA: Diagnosis not present

## 2023-10-15 DIAGNOSIS — I2510 Atherosclerotic heart disease of native coronary artery without angina pectoris: Secondary | ICD-10-CM | POA: Diagnosis not present

## 2023-10-15 DIAGNOSIS — J441 Chronic obstructive pulmonary disease with (acute) exacerbation: Secondary | ICD-10-CM | POA: Diagnosis not present

## 2023-10-15 DIAGNOSIS — J984 Other disorders of lung: Secondary | ICD-10-CM | POA: Diagnosis not present

## 2023-10-15 DIAGNOSIS — I10 Essential (primary) hypertension: Secondary | ICD-10-CM | POA: Diagnosis not present

## 2023-10-15 DIAGNOSIS — I2699 Other pulmonary embolism without acute cor pulmonale: Secondary | ICD-10-CM | POA: Diagnosis not present

## 2023-10-15 LAB — BASIC METABOLIC PANEL WITH GFR
Anion gap: 9 (ref 5–15)
BUN: 15 mg/dL (ref 8–23)
CO2: 33 mmol/L — ABNORMAL HIGH (ref 22–32)
Calcium: 9.4 mg/dL (ref 8.9–10.3)
Chloride: 97 mmol/L — ABNORMAL LOW (ref 98–111)
Creatinine, Ser: 0.6 mg/dL (ref 0.44–1.00)
GFR, Estimated: >60 mL/min (ref >60)
Glucose, Bld: 98 mg/dL (ref 70–99)
Potassium: 4 mmol/L (ref 3.5–5.1)
Sodium: 139 mmol/L (ref 135–145)

## 2023-10-15 LAB — CBC
HCT: 37.3 % (ref 36.0–46.0)
Hemoglobin: 11.4 g/dL — ABNORMAL LOW (ref 12.0–15.0)
MCH: 30.7 pg (ref 26.0–34.0)
MCHC: 30.6 g/dL (ref 30.0–36.0)
MCV: 100.5 fL — ABNORMAL HIGH (ref 80.0–100.0)
Platelets: 178 K/uL (ref 150–400)
RBC: 3.71 MIL/uL — ABNORMAL LOW (ref 3.87–5.11)
RDW: 12.6 % (ref 11.5–15.5)
WBC: 8.2 K/uL (ref 4.0–10.5)
nRBC: 0 % (ref 0.0–0.2)

## 2023-10-15 LAB — HEPARIN LEVEL (UNFRACTIONATED)
Heparin Unfractionated: 0.26 [IU]/mL — ABNORMAL LOW (ref 0.30–0.70)
Heparin Unfractionated: 0.38 [IU]/mL (ref 0.30–0.70)
Heparin Unfractionated: 0.46 [IU]/mL (ref 0.30–0.70)

## 2023-10-15 MED ORDER — ONDANSETRON HCL 4 MG/2ML IJ SOLN
4.0000 mg | Freq: Four times a day (QID) | INTRAMUSCULAR | Status: DC | PRN
  Administered 2023-10-15: 4 mg via INTRAVENOUS
  Filled 2023-10-15: qty 2

## 2023-10-15 MED ORDER — HYDRALAZINE HCL 20 MG/ML IJ SOLN
10.0000 mg | Freq: Once | INTRAMUSCULAR | Status: AC
  Administered 2023-10-15: 10 mg via INTRAVENOUS
  Filled 2023-10-15: qty 1

## 2023-10-15 MED ORDER — AZITHROMYCIN 500 MG PO TABS
500.0000 mg | ORAL_TABLET | ORAL | Status: DC
  Administered 2023-10-16 – 2023-10-21 (×3): 500 mg via ORAL
  Filled 2023-10-15 (×2): qty 1
  Filled 2023-10-15: qty 2

## 2023-10-15 MED ORDER — ALPRAZOLAM 0.25 MG PO TABS
0.2500 mg | ORAL_TABLET | Freq: Once | ORAL | Status: AC
  Administered 2023-10-15: 0.25 mg via ORAL
  Filled 2023-10-15: qty 1

## 2023-10-15 MED ORDER — AZITHROMYCIN 250 MG PO TABS: 500.0000 mg | ORAL_TABLET | Freq: Every day | ORAL | Status: DC

## 2023-10-15 MED ORDER — PANTOPRAZOLE SODIUM 40 MG IV SOLR
40.0000 mg | Freq: Two times a day (BID) | INTRAVENOUS | Status: DC
  Administered 2023-10-15 – 2023-10-21 (×13): 40 mg via INTRAVENOUS
  Filled 2023-10-15 (×13): qty 10

## 2023-10-15 MED ORDER — LISINOPRIL 5 MG PO TABS
5.0000 mg | ORAL_TABLET | Freq: Every day | ORAL | Status: DC
  Administered 2023-10-15 – 2023-10-20 (×5): 5 mg via ORAL
  Filled 2023-10-15: qty 2
  Filled 2023-10-15: qty 1
  Filled 2023-10-15: qty 2
  Filled 2023-10-15: qty 1
  Filled 2023-10-15: qty 2
  Filled 2023-10-15 (×2): qty 1

## 2023-10-15 MED ORDER — PROCHLORPERAZINE EDISYLATE 10 MG/2ML IJ SOLN
5.0000 mg | Freq: Four times a day (QID) | INTRAMUSCULAR | Status: DC | PRN
  Administered 2023-10-15: 5 mg via INTRAVENOUS
  Filled 2023-10-15: qty 2

## 2023-10-15 NOTE — Progress Notes (Addendum)
 PROGRESS NOTE    Kristy Weeks  FMW:[id number] DOB: 05/27/1950 DOA: 10/13/2023 PCP: [name], PA-C   Brief Narrative: 74 year old with past medical history significant for chronic respiratory failure on 4 L of oxygen secondary to COPD, chronic cavitary lung lesion (MAC) and bronchiectasis, CAD status post PCI December 2023, hypertension, recent admission in July for respiratory failure in the setting of COVID, has been on prolonged antibiotics presents complaining of hemoptysis.  In the ED she required 6 L of oxygen but subsequently was able to transition down to 4 L.  CT show right segmental and subsegmental PE and demonstrated large cavitary lesion with chronic consolidation.     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  -CT angio chest:  Interval development of a right lower lobe segmental and subsegmental pulmonary embolus. Redemonstration of large cavitary right upper lobe lesion that is continuous with a chronic right lower lobe consolidation and surrounding satellite nodular airspace opacities in the right lower lobe and right middle lobes. No significant change. -Monitor hemoptysis on Heparin  gtt. Plan to continue heparin  gtt for 48 hours -Pulmonologist consulted due to hemoptysis/ Appreciate assistance. If patient hemoptysis worsen may consider IRR consultation for RUL bronchial artery embolization.  -ECHO: Normal RV function and LE doppler: negative.  -Hb has remain stable.   History of cavitary lung lesion: History of Mycobacterium Avium complex  infection -Resume Azithromycin .  -Appreciate pulmonologist assistance.    CAD status post PCI 2023 -Patient still on dual antiplatelet therapy.  -Cardiology consulted, in regards dual antiplatelet therapy tx for CAD, now that patient will required NOAC and  has Hemoptysis.   -Continue lipitor./  -appreciate cardiology recommendations. Plan to hold aspirin . Monitor on plavix , if hemoptysis worsen plan to hold plavix .   Chronic hypoxic respiratory failure, on 4 L of oxygen at home, COPD -Continue pulmicort , brovana , Incruse, schedule albuterol .  -Continue Guaifenesin .  -Resume Azithromycin .  -Advised she will need close follow up with Pulmonologist, could benefit from repeat bronchoscopy bx due to recent PE>  -Continue prednisone .    Hypertension: -Continue carvedilol / resume lisinopril , BP elevated      Estimated body mass index is 18.5 kg/m as calculated from the following:   Height as of this encounter: 5' 4 (1.626 m).   Weight as of this encounter: 48.9 kg.   DVT prophylaxis: heparin  gtt Code Status: full code Family Communication: Care dicussed with patient.  Disposition Plan:  Status is: Inpatient Remains inpatient appropriate because: management of PE    Consultants:  Cardiology Pulmonologist   Procedures:  ECHO Doppler  Antimicrobials:    Subjective: She feels worn out. Denies worsening shortness of breath.  Still coughing.  Reports less hemoptysis  Objective: Vitals:   10/15/23 0200 10/15/23 0229 10/15/23 0300 10/15/23 0400  BP: (!) 169/85 (!) 161/79 (!) 164/84 (!) 185/78  Pulse: 75 78 73 76  Resp: 18 (!) 22 17 (!) 22  Temp:    97.8 F (36.6 C)  TempSrc:    Oral  SpO2: 100% 98% 99% 100%  Weight:      Height:        Intake/Output Summary (  Last 24 hours) at 10/15/2023 0710 Last data filed at 10/15/2023 0154 Gross per 24 hour  Intake 153.79 ml  Output --  Net 153.79 ml   Filed Weights   10/13/23 1931 10/14/23 0107  Weight: 49 kg 48.9  kg    Examination:  General exam: No acute distress Respiratory system: Bilateral rhonchorous Cardiovascular system: S1-S2 regular rhythm and rate Gastrointestinal system: Bowel sounds present, soft nontender nondistended Central nervous system: Alert conversant Extremities: No edema    Data Reviewed: I have personally reviewed following labs and imaging studies  CBC: Recent Labs  Lab 10/13/23 1958 10/14/23 0325 10/14/23 0356 10/14/23 0632 10/15/23 0320  WBC 9.1 7.5 DUP PER JAMES PA @0434  TW 8.5 8.2  NEUTROABS  --  4.8 PENDING  --   --   HGB 12.0 11.3* DUP PER JAMES PA @0434  TW 11.7* 11.4*  HCT 38.3 38.9 DUP PER JAMES PA @0434  TW 39.6 37.3  MCV 98.0 101.0* DUP PER JAMES PA @0434  TW 102.9* 100.5*  PLT 230 204 DUP PER JAMES PA @0434  TW 201 178   Basic Metabolic Panel: Recent Labs  Lab 10/13/23 1958 10/14/23 0325 10/15/23 0320  NA 140 141 139  K 4.8 3.9 4.0  CL 98 97* 97*  CO2 33* 33* 33*  GLUCOSE 143* 125* 98  BUN 20 19 15   CREATININE 0.64 0.56 0.60  CALCIUM  9.8 9.5 9.4   GFR: Estimated Creatinine Clearance: 47.6 mL/min (by C-G formula based on SCr of 0.6 mg/dL). Liver Function Tests: Recent Labs  Lab 10/14/23 0447  AST 20  ALT 9  ALKPHOS 52  BILITOT 0.4  PROT 6.1*  ALBUMIN 3.9   No results for input(s): LIPASE, AMYLASE in the last 168 hours. No results for input(s): AMMONIA in the last 168 hours. Coagulation Profile: No results for input(s): INR, PROTIME in the last 168 hours. Cardiac Enzymes: No results for input(s): CKTOTAL, CKMB, CKMBINDEX, TROPONINI in the last 168 hours. BNP (last 3 results) Recent Labs    07/07/23 1942 10/14/23 0447  PROBNP 655.0* 995.0*   HbA1C: No results for input(s): HGBA1C in the last 72 hours. CBG: No results for input(s): GLUCAP in the last 168 hours. Lipid Profile: No results for input(s): CHOL, HDL, LDLCALC, TRIG, CHOLHDL, LDLDIRECT in the last 72 hours. Thyroid Function  Tests: No results for input(s): TSH, T4TOTAL, FREET4, T3FREE, THYROIDAB in the last 72 hours. Anemia Panel: No results for input(s): VITAMINB12, FOLATE, FERRITIN, TIBC, IRON, RETICCTPCT in the last 72 hours. Sepsis Labs: No results for input(s): PROCALCITON, LATICACIDVEN in the last 168 hours.  Recent Results (from the past 240 hours)  MRSA Next Gen by PCR, Nasal     Status: None   Collection Time: 10/14/23  1:02 AM   Specimen: Nasal Mucosa; Nasal Swab  Result Value Ref Range Status   MRSA by PCR Next Gen NOT DETECTED NOT DETECTED Final    Comment: (NOTE) The GeneXpert MRSA Assay (FDA approved for NASAL specimens only), is one component of a comprehensive MRSA colonization surveillance program. It is not intended to diagnose MRSA infection nor to guide or monitor treatment for MRSA infections. Test performance is not FDA approved in patients less than 39 years old. Performed at [facility], [address], [place], [place] [address]          Radiology Studies: VAS US  LOWER EXTREMITY VENOUS (DVT) Result Date: 10/14/2023  Lower Venous DVT Study Patient Name:  NORMAJEAN NASH  Date of Exam:   10/14/2023 Medical  Rec #: [id number]         Accession #:    [id number] Date of Birth: 1951-01-08        Patient Gender: F Patient Age:   67 years Exam Location:  [facility] Procedure:      VAS US  LOWER EXTREMITY VENOUS (DVT) Referring Phys: OWEN Sollie Vultaggio --------------------------------------------------------------------------------  Indications: Edema, and pulmonary embolism.  Risk Factors: Confirmed PE. Anticoagulation: Heparin . Limitations: Poor ultrasound/tissue interface. Comparison Study: No prior studies. Performing Technologist: Cordella Collet RVT  Examination Guidelines: A complete evaluation includes B-mode imaging, spectral Doppler, color Doppler, and power Doppler as needed of all accessible portions of each vessel. Bilateral  testing is considered an integral part of a complete examination. Limited examinations for reoccurring indications may be performed as noted. The reflux portion of the exam is performed with the patient in reverse Trendelenburg.  +---------+---------------+---------+-----------+----------+--------------+ RIGHT    CompressibilityPhasicitySpontaneityPropertiesThrombus Aging +---------+---------------+---------+-----------+----------+--------------+ CFV      Full           Yes      Yes                                 +---------+---------------+---------+-----------+----------+--------------+ SFJ      Full                                                        +---------+---------------+---------+-----------+----------+--------------+ FV Prox  Full                                                        +---------+---------------+---------+-----------+----------+--------------+ FV Mid   Full                                                        +---------+---------------+---------+-----------+----------+--------------+ FV DistalFull                                                        +---------+---------------+---------+-----------+----------+--------------+ PFV      Full                                                        +---------+---------------+---------+-----------+----------+--------------+ POP      Full           Yes      Yes                                 +---------+---------------+---------+-----------+----------+--------------+ PTV      Full                                                        +---------+---------------+---------+-----------+----------+--------------+  PERO     Full                                                        +---------+---------------+---------+-----------+----------+--------------+   +---------+---------------+---------+-----------+----------+--------------+ LEFT      CompressibilityPhasicitySpontaneityPropertiesThrombus Aging +---------+---------------+---------+-----------+----------+--------------+ CFV      Full           Yes      Yes                                 +---------+---------------+---------+-----------+----------+--------------+ SFJ      Full                                                        +---------+---------------+---------+-----------+----------+--------------+ FV Prox  Full                                                        +---------+---------------+---------+-----------+----------+--------------+ FV Mid   Full                                                        +---------+---------------+---------+-----------+----------+--------------+ FV DistalFull                                                        +---------+---------------+---------+-----------+----------+--------------+ PFV      Full                                                        +---------+---------------+---------+-----------+----------+--------------+ POP      Full           Yes      Yes                                 +---------+---------------+---------+-----------+----------+--------------+ PTV      Full                                                        +---------+---------------+---------+-----------+----------+--------------+ PERO     Full                                                        +---------+---------------+---------+-----------+----------+--------------+  Summary: RIGHT: - There is no evidence of deep vein thrombosis in the lower extremity.  - No cystic structure found in the popliteal fossa.  LEFT: - There is no evidence of deep vein thrombosis in the lower extremity.  - No cystic structure found in the popliteal fossa.  *See table(s) above for measurements and observations. Electronically signed by Fonda Rim on 10/14/2023 at 2:51:33 PM.    Final    ECHOCARDIOGRAM  COMPLETE Result Date: 10/14/2023    ECHOCARDIOGRAM REPORT   Patient Name:   Daffney Stanhope Date of Exam: 10/14/2023 Medical Rec #:  [id number]        Height:       64.0 in Accession #:    [id number]       Weight:       107.8 lb Date of Birth:  1950/09/23       BSA:          1.504 m Patient Age:    72 years         BP:           163/90 mmHg Patient Gender: F                HR:           83 bpm. Exam Location:  Inpatient Procedure: 3D Echo, Color Doppler and Cardiac Doppler (Both Spectral and Color            Flow Doppler were utilized during procedure). Indications:    Pulmonary Embolus I26.09  History:        Patient has prior history of Echocardiogram examinations, most                 recent 07/08/2023.  Sonographer:    Tinnie Gosling RDCS Referring Phys: [phone] Rane Dumm A Rondy Krupinski IMPRESSIONS  1. Mild septal hypokinesis. Left ventricular ejection fraction, by estimation, is 60 to 65%. The left ventricle has normal function. The left ventricle demonstrates regional wall motion abnormalities (see scoring diagram/findings for description). Left ventricular diastolic parameters are consistent with Grade I diastolic dysfunction (impaired relaxation).  2. Right ventricular systolic function is normal. The right ventricular size is normal. There is mildly elevated pulmonary artery systolic pressure.  3. The mitral valve is normal in structure. Trivial mitral valve regurgitation. No evidence of mitral stenosis.  4. The aortic valve is calcified. There is mild calcification of the aortic valve. There is mild thickening of the aortic valve. Aortic valve regurgitation is mild. No aortic stenosis is present. Aortic regurgitation PHT measures 622 msec.  5. The inferior vena cava is normal in size with greater than 50% respiratory variability, suggesting right atrial pressure of 3 mmHg. FINDINGS  Left Ventricle: Mild septal hypokinesis. Left ventricular ejection fraction, by estimation, is 60 to 65%. The left ventricle has normal  function. The left ventricle demonstrates regional wall motion abnormalities. The left ventricular internal cavity size was normal in size. There is no left ventricular hypertrophy. Left ventricular diastolic parameters are consistent with Grade I diastolic dysfunction (impaired relaxation). Indeterminate filling pressures.  LV Wall Scoring: The inferior wall, mid inferoseptal segment, and basal inferoseptal segment are hypokinetic. The entire anterior wall, entire lateral wall, entire anterior septum, and entire apex are normal. Right Ventricle: The right ventricular size is normal. No increase in right ventricular wall thickness. Right ventricular systolic function is normal. There is mildly elevated pulmonary artery systolic pressure. The tricuspid regurgitant velocity is 2.90  m/s, and with an assumed right atrial pressure  of 3 mmHg, the estimated right ventricular systolic pressure is 36.6 mmHg. Left Atrium: Left atrial size was normal in size. Right Atrium: Right atrial size was normal in size. Pericardium: There is no evidence of pericardial effusion. Mitral Valve: The mitral valve is normal in structure. Trivial mitral valve regurgitation. No evidence of mitral valve stenosis. Tricuspid Valve: The tricuspid valve is normal in structure. Tricuspid valve regurgitation is trivial. No evidence of tricuspid stenosis. Aortic Valve: The aortic valve is calcified. There is mild calcification of the aortic valve. There is mild thickening of the aortic valve. Aortic valve regurgitation is mild. Aortic regurgitation PHT measures 622 msec. No aortic stenosis is present. Pulmonic Valve: The pulmonic valve was normal in structure. Pulmonic valve regurgitation is not visualized. No evidence of pulmonic stenosis. Aorta: The aortic root is normal in size and structure. Venous: The inferior vena cava is normal in size with greater than 50% respiratory variability, suggesting right atrial pressure of 3 mmHg. IAS/Shunts: No  atrial level shunt detected by color flow Doppler.  LEFT VENTRICLE PLAX 2D LVIDd:         4.40 cm   Diastology LVIDs:         3.10 cm   LV e' medial:    3.59 cm/s LV PW:         1.00 cm   LV E/e' medial:  13.6 LV IVS:        1.00 cm   LV e' lateral:   4.35 cm/s LVOT diam:     2.10 cm   LV E/e' lateral: 11.2 LV SV:         48 LV SV Index:   32 LVOT Area:     3.46 cm  IVC IVC diam: 1.60 cm LEFT ATRIUM         Index LA diam:    2.90 cm 1.93 cm/m  AORTIC VALVE LVOT Vmax:   103.00 cm/s LVOT Vmean:  65.600 cm/s LVOT VTI:    0.140 m AI PHT:      622 msec  AORTA Ao Root diam: 3.00 cm Ao Asc diam:  3.60 cm MITRAL VALVE               TRICUSPID VALVE MV Area (PHT): 2.71 cm    TR Peak grad:   33.6 mmHg MV Decel Time: 280 msec    TR Vmax:        290.00 cm/s MV E velocity: 48.70 cm/s MV A velocity: 84.60 cm/s  SHUNTS MV E/A ratio:  0.58        Systemic VTI:  0.14 m                            Systemic Diam: 2.10 cm Annabella Scarce MD Electronically signed by Annabella Scarce MD Signature Date/Time: 10/14/2023/12:50:16 PM    Final    CT Angio Chest PE W and/or Wo Contrast Result Date: 10/13/2023 CLINICAL DATA:  Pulmonary embolism (PE) suspected, high prob hemopytosis c/o hemoptysis x 3 hours. Increased O2 demands. Family reports persistent congested cough since July EXAM: CT ANGIOGRAPHY CHEST WITH CONTRAST TECHNIQUE: Multidetector CT imaging of the chest was performed using the standard protocol during bolus administration of intravenous contrast. Multiplanar CT image reconstructions and MIPs were obtained to evaluate the vascular anatomy. RADIATION DOSE REDUCTION: This exam was performed according to the departmental dose-optimization program which includes automated exposure control, adjustment of the mA and/or kV according to patient size and/or  use of iterative reconstruction technique. CONTRAST:  70mL OMNIPAQUE  IOHEXOL  350 MG/ML SOLN COMPARISON:  CT chest 03/16/2023, CT angio chest 01/19/2023 FINDINGS: Cardiovascular:  Satisfactory opacification of the pulmonary arteries to the segmental level. Interval development of a right lower lobe segmental and subsegmental pulmonary embolus (5:94, 7:93). Abrupt cut off of a right upper lobe pulmonary artery again noted due to large cavitary mass. The left and right main pulmonary arteries are enlarged in size. Normal heart size. No significant pericardial effusion. The ascending thoracic aorta is normal in caliber. The descending thoracic aorta is enlarged in caliber measuring up to 3.7 cm-stable. Severe atherosclerotic plaque of the thoracic aorta. Four-vessel coronary artery calcifications. Mediastinum/Nodes: No enlarged mediastinal, hilar, or axillary lymph nodes. Thyroid gland, trachea, and esophagus demonstrate no significant findings. Lungs/Pleura: Right lung volume loss. Severe emphysematous changes. Redemonstration of a large cavitary mass at the right apex measuring approximately 10 x 6 cm with interval slight decrease in internal layering fluid. Right upper lobe bronchiectasis. Similar-appearing right lower lobe consolidation that extends superiorly in use with the right upper lobe cavitary mass. Redemonstration of patchy nodular like airspace opacities within the right lower lobe and right middle lobe. No pleural effusion. No pneumothorax. 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 Aneurysmal partially visualized infrarenal abdominal aorta. Atrophic right kidney. 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 Musculoskeletal: No chest wall abnormality. No suspicious lytic or blastic osseous lesions. No acute displaced fracture. Multilevel degenerative changes of the spine. Review of the MIP images confirms the above findings. IMPRESSION: 1. Interval development of a right lower lobe segmental and subsegmental pulmonary embolus. No associated right heart strain or pulmonary infarction. 2. Redemonstration of large cavitary  right upper lobe lesion that is continuous with a chronic right lower lobe consolidation and surrounding satellite nodular airspace opacities in the right lower lobe and right middle lobes. No significant change. Finding may be infectious or malignant in etiology. 3. Emphysema (ICD10-J43.9)-severe. 4. Aortic Atherosclerosis (ICD10-I70.0) -severe including four-vessel coronary artery calcification. 5. 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 6. 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 Aneurysmal partially visualized infrarenal abdominal aorta. Consider outpatient CTA abdomen pelvis for further evaluation. 7. 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 8. Atrophic right kidney. Electronically Signed   By: Morgane  Naveau M.D.   On: 10/13/2023 22:14        Scheduled Meds:  albuterol   2.5 mg Nebulization Q6H   ALPRAZolam   0.25-0.5 mg Oral QHS   arformoterol   15 mcg Nebulization BID   atorvastatin   40 mg Oral QHS   budesonide   0.5 mg Nebulization BID   carvedilol   6.25 mg Oral BID WC   Chlorhexidine  Gluconate Cloth  6 each Topical Daily   clopidogrel   75 mg Oral Daily   folic acid   1 mg Oral Daily   guaiFENesin   1,200 mg Oral BID   predniSONE   10 mg Oral Q breakfast   revefenacin   175 mcg Nebulization Daily   umeclidinium bromide   1 puff Inhalation Daily   Continuous Infusions:  heparin  950 Units/hr (10/15/23 0424)     LOS: 1 day    Time spent: 35 minutes    Melda Mermelstein A Tsutomu Barfoot, MD [facility]   If 7PM-7AM, please contact night-coverage [email or web address]  10/15/2023, 7:10 AM

## 2023-10-15 NOTE — Progress Notes (Signed)
 PHARMACY - ANTICOAGULATION CONSULT NOTE  Pharmacy Consult for Heparin  Indication: pulmonary embolus  Allergies  Allergen Reactions   Cefdinir Other (See Comments)    Abdominal pain   Hydrocodone -Acetaminophen  Nausea Only    Patient Measurements: Height: 5' 4 (162.6 cm) Weight: 48.9 kg (107 lb 12.9 oz) IBW/kg (Calculated) : 54.7 HEPARIN  DW (KG): 48.9  Vital Signs: Temp: 97.8 F (36.6 C) (09/18 0400) Temp Source: Oral (09/18 0400) BP: 164/84 (09/18 0300) Pulse Rate: 73 (09/18 0300)  Labs: Recent Labs    10/13/23 1958 10/14/23 0325 10/14/23 0356 10/14/23 0632 10/14/23 1502 10/15/23 0320  HGB 12.0 11.3* DUP PER JAMES PA @0434  TW 11.7*  --  11.4*  HCT 38.3 38.9 DUP PER JAMES PA @0434  TW 39.6  --  37.3  PLT 230 204 DUP PER JAMES PA @0434  TW 201  --  178  HEPARINUNFRC  --   --   --  0.38 0.34 0.26*  CREATININE 0.64 0.56  --   --   --  0.60    Estimated Creatinine Clearance: 47.6 mL/min (by C-G formula based on SCr of 0.6 mg/dL).   Medical History: Past Medical History:  Diagnosis Date   COPD (chronic obstructive pulmonary disease) (HCC)    wears 2L most of the time   Coronary artery disease    Hypertension    Assessment: 27 yoF presenting with SOB and hemoptysis. Pt has no previous history of DVT or PE. CTA on 9/16 reveals PE with no associated right heart strain. Pt with history of CAD and on Plavix . Pt has no history of oral anticoagulant use.   Today, 10/15/2023:  HL 0.26 subtherapeutic on 850 units/hr Hgb 11.4, plts 178 No complications of therapy noted  Goal of Therapy:  Heparin  level 0.3-0.7 units/ml Monitor platelets by anticoagulation protocol: Yes   Plan:  Increase heparin  drip to 950 units/hr Heparin  level in 8 hours Daily heparin  level and CBC Follow up plans for long-term anticoagulation  Leeroy Mace RPh 10/15/2023, 4:18 AM

## 2023-10-15 NOTE — Progress Notes (Signed)
 PHARMACY - ANTICOAGULATION CONSULT NOTE  Pharmacy Consult for Heparin  Indication: pulmonary embolus  Allergies  Allergen Reactions   Cefdinir Other (See Comments)    Abdominal pain   Hydrocodone -Acetaminophen  Nausea Only    Patient Measurements: Height: 5' 4 (162.6 cm) Weight: 48.9 kg (107 lb 12.9 oz) IBW/kg (Calculated) : 54.7 HEPARIN  DW (KG): 48.9  Vital Signs: Temp: 97.7 F (36.5 C) (09/18 0800) Temp Source: Oral (09/18 0800) BP: 166/91 (09/18 0800) Pulse Rate: 97 (09/18 0800)  Labs: Recent Labs    10/13/23 1958 10/14/23 0325 10/14/23 0356 10/14/23 0632 10/14/23 1502 10/15/23 0320  HGB 12.0 11.3* DUP PER JAMES PA @0434  TW 11.7*  --  11.4*  HCT 38.3 38.9 DUP PER JAMES PA @0434  TW 39.6  --  37.3  PLT 230 204 DUP PER JAMES PA @0434  TW 201  --  178  HEPARINUNFRC  --   --   --  0.38 0.34 0.26*  CREATININE 0.64 0.56  --   --   --  0.60    Estimated Creatinine Clearance: 47.6 mL/min (by C-G formula based on SCr of 0.6 mg/dL).   Medical History: Past Medical History:  Diagnosis Date   COPD (chronic obstructive pulmonary disease) (HCC)    wears 2L most of the time   Coronary artery disease    Hypertension    Assessment: 43 yoF presenting with SOB and hemoptysis. Pt has no previous history of DVT or PE. CTA on 9/16 reveals PE with no associated right heart strain. Pt with history of CAD and on Plavix . Pt has no history of oral anticoagulant use. 9/17 Dopplers: neg DVT   Today, 10/15/2023:  HL 0.38, therapeutic on heparin  950 units/hr CBC:  Hgb 11.4, plts WNL No bleeding or complications of therapy noted.  Patient reports only very minimal but improving hemoptysis.    Goal of Therapy:  Heparin  level 0.3-0.7 units/ml Monitor platelets by anticoagulation protocol: Yes   Plan:  Continue heparin  IV infusion at 950 units/hr Confirmatory heparin  level in 8 hours Daily heparin  level and CBC Follow up plans for long-term anticoagulation after 48 hours on heparin   if stable and not having worsening hemoptysis.    Wanda Hasting PharmD, BCPS [facility] main pharmacy [phone] 10/15/2023 10:48 AM

## 2023-10-15 NOTE — Progress Notes (Signed)
 PHARMACY - ANTICOAGULATION CONSULT NOTE  Pharmacy Consult for Heparin  Indication: pulmonary embolus  Allergies  Allergen Reactions   Cefdinir Other (See Comments)    Abdominal pain   Hydrocodone -Acetaminophen  Nausea Only    Patient Measurements: Height: 5' 4 (162.6 cm) Weight: 48.9 kg (107 lb 12.9 oz) IBW/kg (Calculated) : 54.7 HEPARIN  DW (KG): 48.9  Vital Signs: Temp: 98.6 F (37 C) (09/18 2006) Temp Source: Oral (09/18 2006) BP: 151/70 (09/18 2100) Pulse Rate: 73 (09/18 2100)  Labs: Recent Labs    10/13/23 1958 10/14/23 0325 10/14/23 0356 10/14/23 0632 10/14/23 1502 10/15/23 0320 10/15/23 1301 10/15/23 2102  HGB 12.0 11.3* DUP PER JAMES PA @0434  TW 11.7*  --  11.4*  --   --   HCT 38.3 38.9 DUP PER JAMES PA @0434  TW 39.6  --  37.3  --   --   PLT 230 204 DUP PER JAMES PA @0434  TW 201  --  178  --   --   HEPARINUNFRC  --   --   --  0.38   < > 0.26* 0.38 0.46  CREATININE 0.64 0.56  --   --   --  0.60  --   --    < > = values in this interval not displayed.    Estimated Creatinine Clearance: 47.6 mL/min (by C-G formula based on SCr of 0.6 mg/dL).   Medical History: Past Medical History:  Diagnosis Date   COPD (chronic obstructive pulmonary disease) (HCC)    wears 2L most of the time   Coronary artery disease    Hypertension    Assessment: 48 yoF presenting with SOB and hemoptysis. Pt has no previous history of DVT or PE. CTA on 9/16 reveals PE with no associated right heart strain. Pt with history of CAD and on Plavix . Pt has no history of oral anticoagulant use. 9/17 Dopplers: neg DVT   Today, 10/15/2023:  HL 0.38, therapeutic on heparin  950 units/hr CBC:  Hgb 11.4, plts WNL No bleeding or complications of therapy noted.  Patient reports only very minimal but improving hemoptysis.    2102 HL 0.46 therapeutic on 950 units/hr  Goal of Therapy:  Heparin  level 0.3-0.7 units/ml Monitor platelets by anticoagulation protocol: Yes   Plan:  Continue heparin   IV infusion at 950 units/hr Daily heparin  level and CBC Follow up plans for long-term anticoagulation after 48 hours on heparin  if stable and not having worsening hemoptysis.    Leeroy Mace RPh 10/15/2023, 10:40 PM

## 2023-10-15 NOTE — Progress Notes (Signed)
   10/15/23 1150  Spiritual Encounters  Type of Visit Initial  Care provided to: Patient  Referral source Other (comment) (Spiritual Consult)  Reason for visit Advance directives  OnCall Visit No   Chaplain responded to a request for advanced directive education. The patient, Kristy Weeks, welcomed me into her space. I went over the documents with her and the process once they are completed. Kristy Weeks's intention is to speak with a family member and complete the documents with that person.   I provided support through a non-anxious presence, listening to the patient's concerns and providing education.   Carley Birmingham [facility]  [phone]

## 2023-10-15 NOTE — Progress Notes (Addendum)
 Rounding Note   Patient Name: Kristy Weeks Date of Encounter: 10/15/2023  [facility] Cardiologist: None   Subjective She continues to have hemoptysis. She thinks it is improving some but not significantly. She also is having a lot of nausea this morning which is new. No vomiting yet. No chest pain. No worsening shortness of breath from baseline.  Scheduled Meds:  albuterol   2.5 mg Nebulization Q6H   ALPRAZolam   0.25-0.5 mg Oral QHS   arformoterol   15 mcg Nebulization BID   atorvastatin   40 mg Oral QHS   budesonide   0.5 mg Nebulization BID   carvedilol   6.25 mg Oral BID WC   Chlorhexidine  Gluconate Cloth  6 each Topical Daily   clopidogrel   75 mg Oral Daily   folic acid   1 mg Oral Daily   guaiFENesin   1,200 mg Oral BID   lisinopril   5 mg Oral Daily   pantoprazole  (PROTONIX ) IV  40 mg Intravenous Q12H   predniSONE   10 mg Oral Q breakfast   revefenacin   175 mcg Nebulization Daily   umeclidinium bromide   1 puff Inhalation Daily   Continuous Infusions:  heparin  950 Units/hr (10/15/23 0424)   PRN Meds: acetaminophen  **OR** acetaminophen , hydrALAZINE , ipratropium-albuterol , nitroGLYCERIN , ondansetron  (ZOFRAN ) IV, traMADol    Vital Signs  Vitals:   10/15/23 0800 10/15/23 0805 10/15/23 0824 10/15/23 1200  BP: (!) 166/91     Pulse: 97     Resp: (!) 21     Temp: 97.7 F (36.5 C)   (!) 97.4 F (36.3 C)  TempSrc: Oral   Oral  SpO2: 93% 95% 98%   Weight:      Height:        Intake/Output Summary (Last 24 hours) at 10/15/2023 1242 Last data filed at 10/15/2023 0845 Gross per 24 hour  Intake 213.79 ml  Output --  Net 213.79 ml      10/14/2023    1:07 AM 10/13/2023    7:31 PM 07/07/2023    7:19 PM  Last 3 Weights  Weight (lbs) 107 lb 12.9 oz 108 lb 108 lb  Weight (kg) 48.9 kg 48.988 kg 48.988 kg      Telemetry Sinus rhythm with multiple PVCs and ventricular couplets.  - Personally Reviewed  ECG  No new ECG tracing today. - Personally  Reviewed  Physical Exam  GEN: No acute distress.   Neck: No JVD. Cardiac: RRR. No murmurs, rubs, or gallops.  Respiratory: Decreased breath sounds bilateral. No wheezes, rhonchi, or rales. MS: No edema. Neuro:  No focal deficits.  Psych: Normal affect. Responds appropriately.  Labs High Sensitivity Troponin:  No results for input(s): TROPONINIHS in the last 720 hours.   Chemistry Recent Labs  Lab 10/13/23 1958 10/14/23 0325 10/14/23 0447 10/15/23 0320  NA 140 141  --  139  K 4.8 3.9  --  4.0  CL 98 97*  --  97*  CO2 33* 33*  --  33*  GLUCOSE 143* 125*  --  98  BUN 20 19  --  15  CREATININE 0.64 0.56  --  0.60  CALCIUM  9.8 9.5  --  9.4  PROT  --   --  6.1*  --   ALBUMIN  --   --  3.9  --   AST  --   --  20  --   ALT  --   --  9  --   ALKPHOS  --   --  52  --   BILITOT  --   --  0.4  --   GFRNONAA >60 >60  --  >60  ANIONGAP 9 11  --  9    Lipids No results for input(s): CHOL, TRIG, HDL, LABVLDL, LDLCALC, CHOLHDL in the last 168 hours.  Hematology Recent Labs  Lab 10/14/23 0356 10/14/23 0632 10/15/23 0320  WBC DUP PER Myer Bohlman PA @0434  TW 8.5 8.2  RBC DUP PER Ronie Fleeger PA @0434  TW 3.85* 3.71*  HGB DUP PER Apollos Tenbrink PA @0434  TW 11.7* 11.4*  HCT DUP PER Randa Riss PA @0434  TW 39.6 37.3  MCV DUP PER Treyon Wymore PA @0434  TW 102.9* 100.5*  MCH DUP PER Aidan Caloca PA @0434  TW 30.4 30.7  MCHC DUP PER Jeidy Hoerner PA @0434  TW 29.5* 30.6  RDW DUP PER Evans Levee PA @0434  TW 12.6 12.6  PLT DUP PER Antoria Lanza PA @0434  TW 201 178   Thyroid No results for input(s): TSH, FREET4 in the last 168 hours.  BNP Recent Labs  Lab 10/14/23 0447  PROBNP 995.0*    DDimer No results for input(s): DDIMER in the last 168 hours.   Radiology  VAS US  LOWER EXTREMITY VENOUS (DVT) Result Date: 10/14/2023  Lower Venous DVT Study Patient Name:  KEENYA MATERA  Date of Exam:   10/14/2023 Medical Rec #: [id number]         Accession #:    [id number] Date of Birth: [birth year]        Patient Gender: F Patient Age:   73  years Exam Location:  [facility] Procedure:      VAS US  LOWER EXTREMITY VENOUS (DVT) Referring Phys: OWEN REGALADO --------------------------------------------------------------------------------  Indications: Edema, and pulmonary embolism.  Risk Factors: Confirmed PE. Anticoagulation: Heparin . Limitations: Poor ultrasound/tissue interface. Comparison Study: No prior studies. Performing Technologist: Cordella Collet RVT  Examination Guidelines: A complete evaluation includes B-mode imaging, spectral Doppler, color Doppler, and power Doppler as needed of all accessible portions of each vessel. Bilateral testing is considered an integral part of a complete examination. Limited examinations for reoccurring indications may be performed as noted. The reflux portion of the exam is performed with the patient in reverse Trendelenburg.  +---------+---------------+---------+-----------+----------+--------------+ RIGHT    CompressibilityPhasicitySpontaneityPropertiesThrombus Aging +---------+---------------+---------+-----------+----------+--------------+ CFV      Full           Yes      Yes                                 +---------+---------------+---------+-----------+----------+--------------+ SFJ      Full                                                        +---------+---------------+---------+-----------+----------+--------------+ FV Prox  Full                                                        +---------+---------------+---------+-----------+----------+--------------+ FV Mid   Full                                                        +---------+---------------+---------+-----------+----------+--------------+  FV DistalFull                                                        +---------+---------------+---------+-----------+----------+--------------+ PFV      Full                                                         +---------+---------------+---------+-----------+----------+--------------+ POP      Full           Yes      Yes                                 +---------+---------------+---------+-----------+----------+--------------+ PTV      Full                                                        +---------+---------------+---------+-----------+----------+--------------+ PERO     Full                                                        +---------+---------------+---------+-----------+----------+--------------+   +---------+---------------+---------+-----------+----------+--------------+ LEFT     CompressibilityPhasicitySpontaneityPropertiesThrombus Aging +---------+---------------+---------+-----------+----------+--------------+ CFV      Full           Yes      Yes                                 +---------+---------------+---------+-----------+----------+--------------+ SFJ      Full                                                        +---------+---------------+---------+-----------+----------+--------------+ FV Prox  Full                                                        +---------+---------------+---------+-----------+----------+--------------+ FV Mid   Full                                                        +---------+---------------+---------+-----------+----------+--------------+ FV DistalFull                                                        +---------+---------------+---------+-----------+----------+--------------+  PFV      Full                                                        +---------+---------------+---------+-----------+----------+--------------+ POP      Full           Yes      Yes                                 +---------+---------------+---------+-----------+----------+--------------+ PTV      Full                                                         +---------+---------------+---------+-----------+----------+--------------+ PERO     Full                                                        +---------+---------------+---------+-----------+----------+--------------+     Summary: RIGHT: - There is no evidence of deep vein thrombosis in the lower extremity.  - No cystic structure found in the popliteal fossa.  LEFT: - There is no evidence of deep vein thrombosis in the lower extremity.  - No cystic structure found in the popliteal fossa.  *See table(s) above for measurements and observations. Electronically signed by Fonda Rim on 10/14/2023 at 2:51:33 PM.    Final    ECHOCARDIOGRAM COMPLETE Result Date: 10/14/2023    ECHOCARDIOGRAM REPORT   Patient Name:   Ayda Peil Date of Exam: 10/14/2023 Medical Rec #:  [id number]        Height:       64.0 in Accession #:    [id number]       Weight:       107.8 lb Date of Birth:  1950/10/03       BSA:          1.504 m Patient Age:    72 years         BP:           163/90 mmHg Patient Gender: F                HR:           83 bpm. Exam Location:  Inpatient Procedure: 3D Echo, Color Doppler and Cardiac Doppler (Both Spectral and Color            Flow Doppler were utilized during procedure). Indications:    Pulmonary Embolus I26.09  History:        Patient has prior history of Echocardiogram examinations, most                 recent 07/08/2023.  Sonographer:    Tinnie Gosling RDCS Referring Phys: [id number] BELKYS A REGALADO IMPRESSIONS  1. Mild septal hypokinesis. Left ventricular ejection fraction, by estimation, is 60 to 65%. The left ventricle has normal function. The left ventricle demonstrates regional wall motion abnormalities (see scoring diagram/findings for description). Left ventricular diastolic parameters are  consistent with Grade I diastolic dysfunction (impaired relaxation).  2. Right ventricular systolic function is normal. The right ventricular size is normal. There is mildly elevated pulmonary artery  systolic pressure.  3. The mitral valve is normal in structure. Trivial mitral valve regurgitation. No evidence of mitral stenosis.  4. The aortic valve is calcified. There is mild calcification of the aortic valve. There is mild thickening of the aortic valve. Aortic valve regurgitation is mild. No aortic stenosis is present. Aortic regurgitation PHT measures 622 msec.  5. The inferior vena cava is normal in size with greater than 50% respiratory variability, suggesting right atrial pressure of 3 mmHg. FINDINGS  Left Ventricle: Mild septal hypokinesis. Left ventricular ejection fraction, by estimation, is 60 to 65%. The left ventricle has normal function. The left ventricle demonstrates regional wall motion abnormalities. The left ventricular internal cavity size was normal in size. There is no left ventricular hypertrophy. Left ventricular diastolic parameters are consistent with Grade I diastolic dysfunction (impaired relaxation). Indeterminate filling pressures.  LV Wall Scoring: The inferior wall, mid inferoseptal segment, and basal inferoseptal segment are hypokinetic. The entire anterior wall, entire lateral wall, entire anterior septum, and entire apex are normal. Right Ventricle: The right ventricular size is normal. No increase in right ventricular wall thickness. Right ventricular systolic function is normal. There is mildly elevated pulmonary artery systolic pressure. The tricuspid regurgitant velocity is 2.90  m/s, and with an assumed right atrial pressure of 3 mmHg, the estimated right ventricular systolic pressure is 36.6 mmHg. Left Atrium: Left atrial size was normal in size. Right Atrium: Right atrial size was normal in size. Pericardium: There is no evidence of pericardial effusion. Mitral Valve: The mitral valve is normal in structure. Trivial mitral valve regurgitation. No evidence of mitral valve stenosis. Tricuspid Valve: The tricuspid valve is normal in structure. Tricuspid valve regurgitation  is trivial. No evidence of tricuspid stenosis. Aortic Valve: The aortic valve is calcified. There is mild calcification of the aortic valve. There is mild thickening of the aortic valve. Aortic valve regurgitation is mild. Aortic regurgitation PHT measures 622 msec. No aortic stenosis is present. Pulmonic Valve: The pulmonic valve was normal in structure. Pulmonic valve regurgitation is not visualized. No evidence of pulmonic stenosis. Aorta: The aortic root is normal in size and structure. Venous: The inferior vena cava is normal in size with greater than 50% respiratory variability, suggesting right atrial pressure of 3 mmHg. IAS/Shunts: No atrial level shunt detected by color flow Doppler.  LEFT VENTRICLE PLAX 2D LVIDd:         4.40 cm   Diastology LVIDs:         3.10 cm   LV e' medial:    3.59 cm/s LV PW:         1.00 cm   LV E/e' medial:  13.6 LV IVS:        1.00 cm   LV e' lateral:   4.35 cm/s LVOT diam:     2.10 cm   LV E/e' lateral: 11.2 LV SV:         48 LV SV Index:   32 LVOT Area:     3.46 cm  IVC IVC diam: 1.60 cm LEFT ATRIUM         Index LA diam:    2.90 cm 1.93 cm/m  AORTIC VALVE LVOT Vmax:   103.00 cm/s LVOT Vmean:  65.600 cm/s LVOT VTI:    0.140 m AI PHT:      622  msec  AORTA Ao Root diam: 3.00 cm Ao Asc diam:  3.60 cm MITRAL VALVE               TRICUSPID VALVE MV Area (PHT): 2.71 cm    TR Peak grad:   33.6 mmHg MV Decel Time: 280 msec    TR Vmax:        290.00 cm/s MV E velocity: 48.70 cm/s MV A velocity: 84.60 cm/s  SHUNTS MV E/A ratio:  0.58        Systemic VTI:  0.14 m                            Systemic Diam: 2.10 cm Annabella Scarce MD Electronically signed by Annabella Scarce MD Signature Date/Time: 10/14/2023/12:50:16 PM    Final    CT Angio Chest PE W and/or Wo Contrast Result Date: 10/13/2023 CLINICAL DATA:  Pulmonary embolism (PE) suspected, high prob hemopytosis c/o hemoptysis x 3 hours. Increased O2 demands. Family reports persistent congested cough since July EXAM: CT ANGIOGRAPHY  CHEST WITH CONTRAST TECHNIQUE: Multidetector CT imaging of the chest was performed using the standard protocol during bolus administration of intravenous contrast. Multiplanar CT image reconstructions and MIPs were obtained to evaluate the vascular anatomy. RADIATION DOSE REDUCTION: This exam was performed according to the departmental dose-optimization program which includes automated exposure control, adjustment of the mA and/or kV according to patient size and/or use of iterative reconstruction technique. CONTRAST:  70mL OMNIPAQUE  IOHEXOL  350 MG/ML SOLN COMPARISON:  CT chest 03/16/2023, CT angio chest 01/19/2023 FINDINGS: Cardiovascular: Satisfactory opacification of the pulmonary arteries to the segmental level. Interval development of a right lower lobe segmental and subsegmental pulmonary embolus (5:94, 7:93). Abrupt cut off of a right upper lobe pulmonary artery again noted due to large cavitary mass. The left and right main pulmonary arteries are enlarged in size. Normal heart size. No significant pericardial effusion. The ascending thoracic aorta is normal in caliber. The descending thoracic aorta is enlarged in caliber measuring up to 3.7 cm-stable. Severe atherosclerotic plaque of the thoracic aorta. Four-vessel coronary artery calcifications. Mediastinum/Nodes: No enlarged mediastinal, hilar, or axillary lymph nodes. Thyroid gland, trachea, and esophagus demonstrate no significant findings. Lungs/Pleura: Right lung volume loss. Severe emphysematous changes. Redemonstration of a large cavitary mass at the right apex measuring approximately 10 x 6 cm with interval slight decrease in internal layering fluid. Right upper lobe bronchiectasis. Similar-appearing right lower lobe consolidation that extends superiorly in use with the right upper lobe cavitary mass. Redemonstration of patchy nodular like airspace opacities within the right lower lobe and right middle lobe. No pleural effusion. No pneumothorax.  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 Aneurysmal partially visualized infrarenal abdominal aorta. Atrophic right kidney. 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 Musculoskeletal: No chest wall abnormality. No suspicious lytic or blastic osseous lesions. No acute displaced fracture. Multilevel degenerative changes of the spine. Review of the MIP images confirms the above findings. IMPRESSION: 1. Interval development of a right lower lobe segmental and subsegmental pulmonary embolus. No associated right heart strain or pulmonary infarction. 2. Redemonstration of large cavitary right upper lobe lesion that is continuous with a chronic right lower lobe consolidation and surrounding satellite nodular airspace opacities in the right lower lobe and right middle lobes. No significant change. Finding may be infectious or malignant in etiology. 3. Emphysema (ICD10-J43.9)-severe. 4. Aortic Atherosclerosis (ICD10-I70.0) -severe including four-vessel coronary artery calcification. 5. 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 6. 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 Aneurysmal partially visualized infrarenal abdominal aorta. Consider outpatient CTA abdomen pelvis for further evaluation. 7. 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 8. Atrophic right kidney. Electronically Signed   By: Morgane  Naveau M.D.   On: 10/13/2023 22:14    Cardiac Studies Echocardiogram 10/14/2023: Impressions: 1. Mild septal hypokinesis. Left ventricular ejection fraction, by  estimation, is 60 to 65%. The left ventricle has normal function. The left  ventricle demonstrates regional wall motion abnormalities (see scoring  diagram/findings for description). Left  ventricular diastolic parameters are consistent with Grade I diastolic  dysfunction (impaired relaxation).   2. Right ventricular systolic function is  normal. The right ventricular  size is normal. There is mildly elevated pulmonary artery systolic  pressure.   3. The mitral valve is normal in structure. Trivial mitral valve  regurgitation. No evidence of mitral stenosis.   4. The aortic valve is calcified. There is mild calcification of the  aortic valve. There is mild thickening of the aortic valve. Aortic valve  regurgitation is mild. No aortic stenosis is present. Aortic regurgitation  PHT measures 622 msec.   5. The inferior vena cava is normal in size with greater than 50%  respiratory variability, suggesting right atrial pressure of 3 mmHg.  _______________  Lower Extremity Venous Dopplers 10/14/2023: Summary: RIGHT:  - There is no evidence of deep vein thrombosis in the lower extremity.  - No cystic structure found in the popliteal fossa.    LEFT:  - There is no evidence of deep vein thrombosis in the lower extremity.  - No cystic structure found in the popliteal fossa.    Patient Profile   73 y.o. female with a history of severe multivessel CAD s/p multiple PCIs (most recently DES to LAD in 12/2021), PVCs, severe COPD with chronic hypoxic respiratory failure on 4L of O2 on chronic antibiotics and steroids, chronic cavitary lung lesion, hypertension, hyperlipidemia, and prior tobacco abuse (quit in 2018) who is being seen 10/14/2023 for evaluation of antiplatelet therapy in setting of newly diagnosed PE at the request of Dr. Madelyne.   Assessment & Plan   CAD Patient has a long history of severe CAD with known CTO of of RCA and LCx.  Previously not felt to be a candidate for CABG given severe COPD.  Last PCI was DES to severe tandem lesions of the mid LAD.  Now admitted with a new PE.  EKG shows no acute ischemic changes compared to prior tracings.  High-sensitivity troponin minimally elevated and flat at 31 >> 31 consistent with demand ischemia. Echo this admission showed LVEF of 60-65% with mild septal hypokinesis, normal RV  function, and mild AI. - No chest pain. - On DAPT with Aspirin  and Plavix  at home. However, Aspirin  has been stopped given need for anticoagulation with newly diagnosed PE. She continues to have hemoptysis with maybe slight improvement although hemoglobin and BP stable. If hemoptysis is improving and hemoglobin and BP stable. Given persistent hemoptysis, suspect we may need to go ahead and stop Plavix  as well. Will review with MD. - Continue Lipitor 40mg  daily. - Recommend follow-up with primary Cardiologist after discharge. She states she already has a follow-up scheduled in the next couple of weeks.   Hemopytsis Acute PE Patient presented with hemoptysis and was found to have new PE. Echo showed normal LV and RV function. Lower extremity dopplers negative for DVT. - She continue  to have hemoptysis. Maybe slight improvement. - Currently on IV Heparin .  - PCCM following. 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 There's no pulmonary infarct on chest imaging which makes hemoptysis from PE less likely. However, the timeline does fit. RUL cavitary lesion from MAI can cause hemoptysis spontaneously. If patient's hemoptysis persists or worsens over time while on full dose anticoagulation, may consider consulting IR for RUL bronchial artery embolization to prevent further hemoptysis.  PCCM has recommended continuing IV Heparin  for 48 hours to monitor hemoptysis before switching to DOAC.   Hypertension Systolic BP mostly in the 160s earlier this morning but most recent systolic BP when I was in the room was in the low 100s. - Continue Coreg  6.25mg  twice daily. - Continue Lisinopril  5mg  daily.   Otherwise, per primary team: - Severe COPD with chronic hypoxic respiratory failure - Chronic cavitary lesion - Hyperlipidemia - Nausea  For questions or updates, please contact [facility] Please consult [email or web address] for contact  info under    Signed, Callie E Goodrich, PA-C  10/15/2023, 12:42 PM    History and all data above reviewed.  I personally took the history today, performed the physical exam and formulated the assessment and plan.  I reviewed all relevant tests and studies. Patient examined.  I agree with the findings as above.  She is breathing perhaps at baseline.  No chest pain.  Still with some hemoptysisThe patient exam reveals COR:RRR  ,  Lungs: decreased breath sounds with scattered rhonchi    ,  Abd: Positive bowel sounds, no rebound no guarding, Ext No edema   .  All available labs, radiology testing, previous records reviewed. Agree with documented assessment and plan. CAD:  On heparin  only.  Resume Plavix  when able.  We will follow as needed.    Lynwood Mithra Spano  7:01 PM  10/15/2023

## 2023-10-15 NOTE — Plan of Care (Signed)

## 2023-10-15 NOTE — Plan of Care (Signed)
 Back to baseline 4L [place], however began having heightened anxiety and persistent nausea this afternoon.  Some improvement with Zofran , Protonix , and Compazine  but states she's still feeling generally unwell.  Vital signs have remained stable.

## 2023-10-15 NOTE — Progress Notes (Addendum)
 NAME:  Kristy Weeks, MRN:  [id number], DOB:  1950-02-28, LOS: 1 ADMISSION DATE:  10/13/2023, CONSULTATION DATE:  10/14/2023 REFERRING MD:  Dr. Madelyne, CHIEF COMPLAINT:  PE, hemoptysis   History of Present Illness:  Ms. 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 CT PE protocol demonstrated right lower lobe segmental and subsegmental pulmonary emboli without associated right heart strain. CT chest showed extensive chronic changes throughout right lung including right upper lobe cavity, bronchiectasis, and mucous plugging, findings stable from prior study and consistent with chronic MAI. PCCM was consulted for further management of PE and hemoptysis.   She began coughing up blood yesterday afternoon. She describes the blood as bright red and saturating through multiple tissues, she said it may be about 1/3 of a cup. She has not had blood in her sputum before. She also has had intermittent chest pain and hotness around her back that comes and goes. It does not seem associated with taking a deep breath. She has a chronic cough which has not changed. She denies infectious symptoms since her recent COVID infection. She takes ASA and Plavix  at home and had PCI with stenting most recently in 12/2021.   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 Patient complains of exacerbation of her chronic back pain.   Objective    Blood pressure (!) 166/91, pulse 97, temperature 97.7 F (36.5 C), temperature source Oral, resp. rate (!) 21, height 5' 4 (1.626 m), weight 48.9 kg, SpO2 98%.    FiO2 (%):  [40 %] 40 %   Intake/Output Summary (Last 24 hours) at 10/15/2023 0851 Last data filed at 10/15/2023 0154 Gross per 24 hour  Intake 153.79 ml  Output --  Net 153.79 ml   Filed Weights   10/13/23 1931 10/14/23 0107  Weight: 49 kg 48.9 kg    Examination: General: chronically ill appearing female, no acute distress HENT: sclera anicteric, normocephalic, atraumatic, moist mucous membranes Lungs: breathing comfortably on nasal cannula, diminished throughout Cardiovascular: regular rate and rhythm, normal S1 and S2, no m/r/g Abdomen: soft, non-tender, non-distended Extremities: warm, dry, no edema Neuro: alert and oriented x 3 GU: deferred  Resolved problem list   Assessment and Plan   Right lower lobe segmental and subsegmental PE Most likely provoked in the setting of recent COVID infection. No known cancer or immobility. Hemodynamically stable with normal RV on Echo. PESI score 84, Bova score 2 indicating low risk. No DVT on BLE duplex. Currently on home 4L nasal cannula with Spo2 93-98%.   - Continue heparin  infusion, continue to monitor hemoptysis on heparin  infusion for at least 48 hours before considering transitioning to DOAC. Low threshold to stop heparin  infusion should patient become hemodynamically  unstable or have increasing volume of hemoptysis or dropping hemoglobin  - Appreciate cardiology input on the necessity of dual anti-platelet therapy with ASA and Plavix   Hemoptysis   Anemia Unclear etiology. Most likely in the setting of chronic MAI infection with RUL cavitation versus PE. Hgb remains stable.  - Consider inhaled TXA if worsening - Correction of coagulopathy as indicated - If ongoing hemoptysis or hemodynamically significant consider IR consult for bronchial artery embolization  - Continue to trend CBC  COPD complicated by chronic respiratory failure Chronic MAI infection with RUL cavitation  On baseline 4L oxygen and up to 5L with exertion. CT chest demonstrates stable disease from prior study with no new enlarging pulmonary nodule or acute pulmonary infiltrate. No evidence of COPD exacerbation.  - Continue home prednisone  - Continue home Incruse ellipta , budenoside and arformoterol   - PRN Duo-nebs  CAD s/p PCI (most recent 12/2021) On ASA and Plavix  at home. - Anti-platelet recommendations per cardiology   HTN - Per primary   Labs   CBC: Recent Labs  Lab 10/13/23 1958 10/14/23 0325 10/14/23 0356 10/14/23 0632 10/15/23 0320  WBC 9.1 7.5 DUP PER JAMES PA @0434  TW 8.5 8.2  NEUTROABS  --  4.8 PENDING  --   --   HGB 12.0 11.3* DUP PER JAMES PA @0434  TW 11.7* 11.4*  HCT 38.3 38.9 DUP PER JAMES PA @0434  TW 39.6 37.3  MCV 98.0 101.0* DUP PER JAMES PA @0434  TW 102.9* 100.5*  PLT 230 204 DUP PER JAMES PA @0434  TW 201 178    Basic Metabolic Panel: Recent Labs  Lab 10/13/23 1958 10/14/23 0325 10/15/23 0320  NA 140 141 139  K 4.8 3.9 4.0  CL 98 97* 97*  CO2 33* 33* 33*  GLUCOSE 143* 125* 98  BUN 20 19 15   CREATININE 0.64 0.56 0.60  CALCIUM  9.8 9.5 9.4   GFR: Estimated Creatinine Clearance: 47.6 mL/min (by C-G formula based on SCr of 0.6 mg/dL). Recent Labs  Lab 10/14/23 0325 10/14/23 0356 10/14/23 0632 10/15/23 0320  WBC 7.5 DUP PER JAMES PA @0434  TW 8.5 8.2    Liver Function Tests: Recent Labs  Lab 10/14/23 0447  AST 20  ALT 9  ALKPHOS 52  BILITOT 0.4  PROT 6.1*  ALBUMIN 3.9   No results for input(s): LIPASE,  AMYLASE in the last 168 hours. No results for input(s): AMMONIA in the last 168 hours.  ABG    Component Value Date/Time   HCO3 35.9 (H) 07/07/2023 1956   TCO2 38 (H) 07/07/2023 1956   O2SAT 97 07/07/2023 1956     Coagulation Profile: No results for input(s): INR, PROTIME in the last 168 hours.  Cardiac Enzymes: No results for input(s): CKTOTAL, CKMB, CKMBINDEX, TROPONINI in the last 168 hours.  HbA1C: Hgb A1c MFr Bld  Date/Time Value Ref Range Status  01/26/2018 11:21 AM 5.4 4.8 - 5.6 % Final    Comment:    (NOTE) Pre diabetes:          5.7%-6.4% Diabetes:              >6.4% Glycemic control for   <7.0% adults with diabetes     CBG: No results for input(s): GLUCAP in the last 168 hours.      Rexene LOISE Blush, PA-C [facility] 10/15/23 8:51 AM  Please see [email or web address] for pager details.  From 7A-7P if no response, please call [phone] After hours, please call ELink [phone]

## 2023-10-16 ENCOUNTER — Inpatient Hospital Stay (HOSPITAL_COMMUNITY)

## 2023-10-16 DIAGNOSIS — J984 Other disorders of lung: Secondary | ICD-10-CM | POA: Diagnosis not present

## 2023-10-16 DIAGNOSIS — R042 Hemoptysis: Secondary | ICD-10-CM | POA: Diagnosis not present

## 2023-10-16 DIAGNOSIS — J441 Chronic obstructive pulmonary disease with (acute) exacerbation: Secondary | ICD-10-CM | POA: Diagnosis not present

## 2023-10-16 DIAGNOSIS — J432 Centrilobular emphysema: Secondary | ICD-10-CM | POA: Diagnosis not present

## 2023-10-16 LAB — CBC
HCT: 38.6 % (ref 36.0–46.0)
Hemoglobin: 12 g/dL (ref 12.0–15.0)
MCH: 30.2 pg (ref 26.0–34.0)
MCHC: 31.1 g/dL (ref 30.0–36.0)
MCV: 97.2 fL (ref 80.0–100.0)
Platelets: 180 K/uL (ref 150–400)
RBC: 3.97 MIL/uL (ref 3.87–5.11)
RDW: 12.7 % (ref 11.5–15.5)
WBC: 8.7 K/uL (ref 4.0–10.5)
nRBC: 0 % (ref 0.0–0.2)

## 2023-10-16 LAB — VITAMIN B12: Vitamin B-12: 548 pg/mL (ref 180–914)

## 2023-10-16 LAB — BASIC METABOLIC PANEL WITH GFR
Anion gap: 10 (ref 5–15)
BUN: 14 mg/dL (ref 8–23)
CO2: 30 mmol/L (ref 22–32)
Calcium: 9.6 mg/dL (ref 8.9–10.3)
Chloride: 90 mmol/L — ABNORMAL LOW (ref 98–111)
Creatinine, Ser: 0.6 mg/dL (ref 0.44–1.00)
GFR, Estimated: >60 mL/min (ref >60)
Glucose, Bld: 125 mg/dL — ABNORMAL HIGH (ref 70–99)
Potassium: 4.6 mmol/L (ref 3.5–5.1)
Sodium: 130 mmol/L — ABNORMAL LOW (ref 135–145)

## 2023-10-16 LAB — BLOOD GAS, VENOUS
Acid-Base Excess: 10.1 mmol/L — ABNORMAL HIGH (ref 0.0–2.0)
Bicarbonate: 38.4 mmol/L — ABNORMAL HIGH (ref 20.0–28.0)
Drawn by: 8894
O2 Saturation: 85.9 %
Patient temperature: 36.8
pCO2, Ven: 67 mmHg — ABNORMAL HIGH (ref 44–60)
pH, Ven: 7.36 (ref 7.25–7.43)
pO2, Ven: 51 mmHg — ABNORMAL HIGH (ref 32–45)

## 2023-10-16 LAB — TROPONIN T, HIGH SENSITIVITY
Troponin T High Sensitivity: 29 ng/L — ABNORMAL HIGH (ref 0–19)
Troponin T High Sensitivity: 27 ng/L — ABNORMAL HIGH (ref 0–19)

## 2023-10-16 LAB — FOLATE: Folate: >20 ng/mL (ref >5.9)

## 2023-10-16 LAB — HEPARIN LEVEL (UNFRACTIONATED): Heparin Unfractionated: 0.49 [IU]/mL (ref 0.30–0.70)

## 2023-10-16 LAB — PRO BRAIN NATRIURETIC PEPTIDE: Pro Brain Natriuretic Peptide: 588 pg/mL — ABNORMAL HIGH (ref <300.0)

## 2023-10-16 MED ORDER — PREDNISONE 20 MG PO TABS
20.0000 mg | ORAL_TABLET | Freq: Every day | ORAL | Status: AC
  Administered 2023-10-20 – 2023-10-21 (×2): 20 mg via ORAL
  Filled 2023-10-16 (×2): qty 1

## 2023-10-16 MED ORDER — SODIUM CHLORIDE 3 % IN NEBU
4.0000 mL | INHALATION_SOLUTION | Freq: Two times a day (BID) | RESPIRATORY_TRACT | Status: AC
  Administered 2023-10-16 – 2023-10-18 (×5): 4 mL via RESPIRATORY_TRACT
  Filled 2023-10-16 (×5): qty 4

## 2023-10-16 MED ORDER — TRANEXAMIC ACID FOR INHALATION
500.0000 mg | Freq: Two times a day (BID) | RESPIRATORY_TRACT | Status: AC
  Administered 2023-10-16 – 2023-10-17 (×4): 500 mg via RESPIRATORY_TRACT
  Filled 2023-10-16 (×4): qty 10

## 2023-10-16 MED ORDER — ALPRAZOLAM 0.25 MG PO TABS
0.2500 mg | ORAL_TABLET | Freq: Every day | ORAL | Status: DC | PRN
  Administered 2023-10-19: 0.25 mg via ORAL
  Filled 2023-10-16: qty 1

## 2023-10-16 MED ORDER — TRANEXAMIC ACID-NACL 1000-0.7 MG/100ML-% IV SOLN
INTRAVENOUS | Status: AC
Start: 2023-10-16 — End: 2023-10-16
  Filled 2023-10-16: qty 100

## 2023-10-16 MED ORDER — TRANEXAMIC ACID-NACL 1000-0.7 MG/100ML-% IV SOLN: 1000.0000 mg | INTRAVENOUS | Status: DC

## 2023-10-16 MED ORDER — TRANEXAMIC ACID FOR INHALATION
500.0000 mg | Freq: Once | RESPIRATORY_TRACT | Status: DC
  Filled 2023-10-16: qty 10

## 2023-10-16 MED ORDER — PREDNISONE 20 MG PO TABS
40.0000 mg | ORAL_TABLET | Freq: Every day | ORAL | Status: AC
  Administered 2023-10-16 – 2023-10-17 (×2): 40 mg via ORAL
  Filled 2023-10-16 (×2): qty 2

## 2023-10-16 MED ORDER — PREDNISONE 5 MG PO TABS: 10.0000 mg | ORAL_TABLET | Freq: Every day | ORAL | Status: DC

## 2023-10-16 MED ORDER — PREDNISONE 5 MG PO TABS
30.0000 mg | ORAL_TABLET | Freq: Every day | ORAL | Status: AC
  Administered 2023-10-18 – 2023-10-19 (×2): 30 mg via ORAL
  Filled 2023-10-16 (×2): qty 2

## 2023-10-16 MED ORDER — PREDNISONE 20 MG PO TABS
40.0000 mg | ORAL_TABLET | Freq: Every day | ORAL | Status: DC
Start: 2023-10-16 — End: 2023-10-16

## 2023-10-16 MED ORDER — IPRATROPIUM-ALBUTEROL 0.5-2.5 (3) MG/3ML IN SOLN
3.0000 mL | Freq: Four times a day (QID) | RESPIRATORY_TRACT | Status: DC
  Administered 2023-10-16 – 2023-10-17 (×7): 3 mL via RESPIRATORY_TRACT
  Filled 2023-10-16 (×7): qty 3

## 2023-10-16 NOTE — Progress Notes (Addendum)
 PROGRESS NOTE    Kristy Weeks  FMW:[id number] DOB: 09-14-1950 DOA: 10/13/2023 PCP: [name], PA-C   Brief Narrative: 73 year old with past medical history significant for chronic respiratory failure on 4 L of oxygen secondary to COPD, chronic cavitary lung lesion (MAC) and bronchiectasis, CAD status post PCI December 2023, hypertension, recent admission in July for respiratory failure in the setting of COVID, has been on prolonged antibiotics presents complaining of hemoptysis.  In the ED she required 6 L of oxygen but subsequently was able to transition down to 4 L.  CT show right segmental and subsegmental PE and demonstrated large cavitary lesion with chronic consolidation.     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  -CT angio chest:  Interval development of a right lower lobe segmental and subsegmental pulmonary embolus. Redemonstration of large cavitary right upper lobe lesion that is continuous with a chronic right lower lobe consolidation and surrounding satellite nodular airspace opacities in the right lower lobe and right middle lobes. No significant change. -Monitor hemoptysis on Heparin  gtt. Plan to continue heparin  gtt for 48 hours -Pulmonologist consulted due to hemoptysis/ Appreciate assistance. If patient hemoptysis worsen may consider IRR consultation for RUL bronchial artery embolization.  -ECHO: Normal RV function and LE doppler: negative.  -Hb has remain stable. Required transiently higher amount of oxygen, but might have been probe was not working.  Chest x ray stable. BNP not worsen, troponin flat.   History of cavitary lung lesion: History of Mycobacterium Avium complex infection Hemoptysis.  -Resume Azithromycin .  -Appreciate pulmonologist assistance.  Plan for respiratory toilet,  hypertonic saline, vets.  Tranexamic acid  inhaler.   CAD status post PCI 2023 -Patient still on dual antiplatelet therapy.  -Cardiology consulted, in regards dual antiplatelet therapy tx for CAD, now that patient will required NOAC and  has Hemoptysis.   -Continue lipitor./  -appreciate cardiology recommendations. Plan to hold aspirin  and plavix . Resume plavix  when able.   Chronic hypoxic respiratory failure, on 4 L of oxygen at home, COPD -Continue pulmicort , brovana , Incruse, schedule albuterol .  -Continue Guaifenesin .  -Resume Azithromycin .  -Advised she will need close follow up with Pulmonologist, could benefit from repeat bronchoscopy bx due to recent PE>  -plan to do prednisone  taper dose for COPD.    Hypertension: -Continue carvedilol / resume lisinopril , BP elevated   Hyponatremia; monitor.    Estimated body mass index is 18.5 kg/m as calculated from the following:   Height as of this encounter: 5' 4 (1.626 m).   Weight as of this encounter: 48.9 kg.   DVT prophylaxis: heparin  gtt Code Status: full code Family Communication: Care dicussed with patient.  Disposition Plan:  Status is: Inpatient Remains inpatient appropriate because: management of PE    Consultants:  Cardiology Pulmonologist   Procedures:  ECHO Doppler  Antimicrobials:    Subjective: She doesn't feel well   She is less nauseous today,  Report hemoptysis. Is the same  Objective: Vitals:   10/16/23 0434 10/16/23 0500 10/16/23 0506 10/16/23 0600  BP:   (!) 150/84 (!) 155/71  Pulse:  72 83 89  Resp:  17 (!) 24 (!)  21  Temp: 98.1 F (36.7 C)     TempSrc: Oral     SpO2:  100% 100% (!) 85%  Weight:      Height:        Intake/Output Summary  (Last 24 hours) at 10/16/2023 0756 Last data filed at 10/16/2023 0600 Gross per 24 hour  Intake 322.86 ml  Output --  Net 322.86 ml   Filed Weights   10/13/23 1931 10/14/23 0107  Weight: 49 kg 48.9 kg    Examination:  General exam: No distress Respiratory system: BL ronchus.  Cardiovascular system: S 1, S 2 RRRR Gastrointestinal system: BS present, soft, nt Central nervous system: Alert, conversant Extremities: No edema    Data Reviewed: I have personally reviewed following labs and imaging studies  CBC: Recent Labs  Lab 10/14/23 0325 10/14/23 0356 10/14/23 0632 10/15/23 0320 10/16/23 0321  WBC 7.5 DUP PER JAMES PA @0434  TW 8.5 8.2 8.7  NEUTROABS 4.8 PENDING  --   --   --   HGB 11.3* DUP PER JAMES PA @0434  TW 11.7* 11.4* 12.0  HCT 38.9 DUP PER JAMES PA @0434  TW 39.6 37.3 38.6  MCV 101.0* DUP PER JAMES PA @0434  TW 102.9* 100.5* 97.2  PLT 204 DUP PER JAMES PA @0434  TW 201 178 180   Basic Metabolic Panel: Recent Labs  Lab 10/13/23 1958 10/14/23 0325 10/15/23 0320 10/16/23 0600  NA 140 141 139 130*  K 4.8 3.9 4.0 4.6  CL 98 97* 97* 90*  CO2 33* 33* 33* 30  GLUCOSE 143* 125* 98 125*  BUN 20 19 15 14   CREATININE 0.64 0.56 0.60 0.60  CALCIUM  9.8 9.5 9.4 9.6   GFR: Estimated Creatinine Clearance: 47.6 mL/min (by C-G formula based on SCr of 0.6 mg/dL). Liver Function Tests: Recent Labs  Lab 10/14/23 0447  AST 20  ALT 9  ALKPHOS 52  BILITOT 0.4  PROT 6.1*  ALBUMIN 3.9   No results for input(s): LIPASE, AMYLASE in the last 168 hours. No results for input(s): AMMONIA in the last 168 hours. Coagulation Profile: No results for input(s): INR, PROTIME in the last 168 hours. Cardiac Enzymes: No results for input(s): CKTOTAL, CKMB, CKMBINDEX, TROPONINI in the last 168 hours. BNP (last 3 results) Recent Labs    07/07/23 1942 10/14/23 0447  PROBNP 655.0* 995.0*   HbA1C: No results for input(s): HGBA1C in the last 72 hours. CBG: No  results for input(s): GLUCAP in the last 168 hours. Lipid Profile: No results for input(s): CHOL, HDL, LDLCALC, TRIG, CHOLHDL, LDLDIRECT in the last 72 hours. Thyroid Function Tests: No results for input(s): TSH, T4TOTAL, FREET4, T3FREE, THYROIDAB in the last 72 hours. Anemia Panel: No results for input(s): VITAMINB12, FOLATE, FERRITIN, TIBC, IRON, RETICCTPCT in the last 72 hours. Sepsis Labs: No results for input(s): PROCALCITON, LATICACIDVEN in the last 168 hours.  Recent Results (from the past 240 hours)  MRSA Next Gen by PCR, Nasal     Status: None   Collection Time: 10/14/23  1:02 AM   Specimen: Nasal Mucosa; Nasal Swab  Result Value Ref Range Status   MRSA by PCR Next Gen NOT DETECTED NOT DETECTED Final    Comment: (NOTE) The GeneXpert MRSA Assay (FDA approved for NASAL specimens only), is one component of a comprehensive MRSA colonization surveillance program. It is not intended to diagnose MRSA infection nor to guide or monitor treatment for MRSA infections. Test performance is not FDA approved in patients less than 30 years old. Performed at [facility]  [facility], [address] [place], [place] [address]          Radiology Studies: VAS US  LOWER EXTREMITY VENOUS (DVT) Result Date: 10/14/2023  Lower Venous DVT Study Patient Name:  JAHNIYA DUZAN  Date of Exam:   10/14/2023 Medical Rec #: [id number]         Accession #:    [id number] Date of Birth: 18-Feb-1950        Patient Gender: F Patient Age:   52 years Exam Location:  [facility] Procedure:      VAS US  LOWER EXTREMITY VENOUS (DVT) Referring Phys: OWEN Tannar Broker --------------------------------------------------------------------------------  Indications: Edema, and pulmonary embolism.  Risk Factors: Confirmed PE. Anticoagulation: Heparin . Limitations: Poor ultrasound/tissue interface. Comparison Study: No prior studies. Performing Technologist: Cordella Collet  RVT  Examination Guidelines: A complete evaluation includes B-mode imaging, spectral Doppler, color Doppler, and power Doppler as needed of all accessible portions of each vessel. Bilateral testing is considered an integral part of a complete examination. Limited examinations for reoccurring indications may be performed as noted. The reflux portion of the exam is performed with the patient in reverse Trendelenburg.  +---------+---------------+---------+-----------+----------+--------------+ RIGHT    CompressibilityPhasicitySpontaneityPropertiesThrombus Aging +---------+---------------+---------+-----------+----------+--------------+ CFV      Full           Yes      Yes                                 +---------+---------------+---------+-----------+----------+--------------+ SFJ      Full                                                        +---------+---------------+---------+-----------+----------+--------------+ FV Prox  Full                                                        +---------+---------------+---------+-----------+----------+--------------+ FV Mid   Full                                                        +---------+---------------+---------+-----------+----------+--------------+ FV DistalFull                                                        +---------+---------------+---------+-----------+----------+--------------+ PFV      Full                                                        +---------+---------------+---------+-----------+----------+--------------+ POP      Full           Yes      Yes                                 +---------+---------------+---------+-----------+----------+--------------+  PTV      Full                                                        +---------+---------------+---------+-----------+----------+--------------+ PERO     Full                                                         +---------+---------------+---------+-----------+----------+--------------+   +---------+---------------+---------+-----------+----------+--------------+ LEFT     CompressibilityPhasicitySpontaneityPropertiesThrombus Aging +---------+---------------+---------+-----------+----------+--------------+ CFV      Full           Yes      Yes                                 +---------+---------------+---------+-----------+----------+--------------+ SFJ      Full                                                        +---------+---------------+---------+-----------+----------+--------------+ FV Prox  Full                                                        +---------+---------------+---------+-----------+----------+--------------+ FV Mid   Full                                                        +---------+---------------+---------+-----------+----------+--------------+ FV DistalFull                                                        +---------+---------------+---------+-----------+----------+--------------+ PFV      Full                                                        +---------+---------------+---------+-----------+----------+--------------+ POP      Full           Yes      Yes                                 +---------+---------------+---------+-----------+----------+--------------+ PTV      Full                                                        +---------+---------------+---------+-----------+----------+--------------+  PERO     Full                                                        +---------+---------------+---------+-----------+----------+--------------+     Summary: RIGHT: - There is no evidence of deep vein thrombosis in the lower extremity.  - No cystic structure found in the popliteal fossa.  LEFT: - There is no evidence of deep vein thrombosis in the lower extremity.  - No cystic structure found in the popliteal fossa.   *See table(s) above for measurements and observations. Electronically signed by Fonda Rim on 10/14/2023 at 2:51:33 PM.    Final    ECHOCARDIOGRAM COMPLETE Result Date: 10/14/2023    ECHOCARDIOGRAM REPORT   Patient Name:   Kristy Weeks Date of Exam: 10/14/2023 Medical Rec #:  [id number]        Height:       64.0 in Accession #:    [id number]       Weight:       107.8 lb Date of Birth:  1950/09/04       BSA:          1.504 m Patient Age:    72 years         BP:           163/90 mmHg Patient Gender: F                HR:           83 bpm. Exam Location:  Inpatient Procedure: 3D Echo, Color Doppler and Cardiac Doppler (Both Spectral and Color            Flow Doppler were utilized during procedure). Indications:    Pulmonary Embolus I26.09  History:        Patient has prior history of Echocardiogram examinations, most                 recent 07/08/2023.  Sonographer:    Tinnie Gosling RDCS Referring Phys: [phone] Jhalil Silvera A Khari Mally IMPRESSIONS  1. Mild septal hypokinesis. Left ventricular ejection fraction, by estimation, is 60 to 65%. The left ventricle has normal function. The left ventricle demonstrates regional wall motion abnormalities (see scoring diagram/findings for description). Left ventricular diastolic parameters are consistent with Grade I diastolic dysfunction (impaired relaxation).  2. Right ventricular systolic function is normal. The right ventricular size is normal. There is mildly elevated pulmonary artery systolic pressure.  3. The mitral valve is normal in structure. Trivial mitral valve regurgitation. No evidence of mitral stenosis.  4. The aortic valve is calcified. There is mild calcification of the aortic valve. There is mild thickening of the aortic valve. Aortic valve regurgitation is mild. No aortic stenosis is present. Aortic regurgitation PHT measures 622 msec.  5. The inferior vena cava is normal in size with greater than 50% respiratory variability, suggesting right atrial pressure of 3  mmHg. FINDINGS  Left Ventricle: Mild septal hypokinesis. Left ventricular ejection fraction, by estimation, is 60 to 65%. The left ventricle has normal function. The left ventricle demonstrates regional wall motion abnormalities. The left ventricular internal cavity size was normal in size. There is no left ventricular hypertrophy. Left ventricular diastolic parameters are consistent with Grade I diastolic dysfunction (impaired relaxation). Indeterminate filling pressures.  LV Wall Scoring: The inferior wall, mid inferoseptal  segment, and basal inferoseptal segment are hypokinetic. The entire anterior wall, entire lateral wall, entire anterior septum, and entire apex are normal. Right Ventricle: The right ventricular size is normal. No increase in right ventricular wall thickness. Right ventricular systolic function is normal. There is mildly elevated pulmonary artery systolic pressure. The tricuspid regurgitant velocity is 2.90  m/s, and with an assumed right atrial pressure of 3 mmHg, the estimated right ventricular systolic pressure is 36.6 mmHg. Left Atrium: Left atrial size was normal in size. Right Atrium: Right atrial size was normal in size. Pericardium: There is no evidence of pericardial effusion. Mitral Valve: The mitral valve is normal in structure. Trivial mitral valve regurgitation. No evidence of mitral valve stenosis. Tricuspid Valve: The tricuspid valve is normal in structure. Tricuspid valve regurgitation is trivial. No evidence of tricuspid stenosis. Aortic Valve: The aortic valve is calcified. There is mild calcification of the aortic valve. There is mild thickening of the aortic valve. Aortic valve regurgitation is mild. Aortic regurgitation PHT measures 622 msec. No aortic stenosis is present. Pulmonic Valve: The pulmonic valve was normal in structure. Pulmonic valve regurgitation is not visualized. No evidence of pulmonic stenosis. Aorta: The aortic root is normal in size and structure.  Venous: The inferior vena cava is normal in size with greater than 50% respiratory variability, suggesting right atrial pressure of 3 mmHg. IAS/Shunts: No atrial level shunt detected by color flow Doppler.  LEFT VENTRICLE PLAX 2D LVIDd:         4.40 cm   Diastology LVIDs:         3.10 cm   LV e' medial:    3.59 cm/s LV PW:         1.00 cm   LV E/e' medial:  13.6 LV IVS:        1.00 cm   LV e' lateral:   4.35 cm/s LVOT diam:     2.10 cm   LV E/e' lateral: 11.2 LV SV:         48 LV SV Index:   32 LVOT Area:     3.46 cm  IVC IVC diam: 1.60 cm LEFT ATRIUM         Index LA diam:    2.90 cm 1.93 cm/m  AORTIC VALVE LVOT Vmax:   103.00 cm/s LVOT Vmean:  65.600 cm/s LVOT VTI:    0.140 m AI PHT:      622 msec  AORTA Ao Root diam: 3.00 cm Ao Asc diam:  3.60 cm MITRAL VALVE               TRICUSPID VALVE MV Area (PHT): 2.71 cm    TR Peak grad:   33.6 mmHg MV Decel Time: 280 msec    TR Vmax:        290.00 cm/s MV E velocity: 48.70 cm/s MV A velocity: 84.60 cm/s  SHUNTS MV E/A ratio:  0.58        Systemic VTI:  0.14 m                            Systemic Diam: 2.10 cm Annabella Scarce MD Electronically signed by Annabella Scarce MD Signature Date/Time: 10/14/2023/12:50:16 PM    Final         Scheduled Meds:  albuterol   2.5 mg Nebulization Q6H   ALPRAZolam   0.25-0.5 mg Oral QHS   arformoterol   15 mcg Nebulization BID   atorvastatin   40 mg Oral  QHS   azithromycin   500 mg Oral Q M,W,F   budesonide   0.5 mg Nebulization BID   carvedilol   6.25 mg Oral BID WC   Chlorhexidine  Gluconate Cloth  6 each Topical Daily   folic acid   1 mg Oral Daily   guaiFENesin   1,200 mg Oral BID   lisinopril   5 mg Oral Daily   pantoprazole  (PROTONIX ) IV  40 mg Intravenous Q12H   predniSONE   10 mg Oral Q breakfast   revefenacin   175 mcg Nebulization Daily   tranexamic acid   500 mg Nebulization Once   umeclidinium bromide   1 puff Inhalation Daily   Continuous Infusions:  heparin  950 Units/hr (10/16/23 0600)     LOS: 2 days     Time spent: 35 minutes    Javarus Dorner A Alan Drummer, MD [facility]   If 7PM-7AM, please contact night-coverage [email or web address]  10/16/2023, 7:56 AM

## 2023-10-16 NOTE — Progress Notes (Signed)
 PHARMACY - ANTICOAGULATION CONSULT NOTE  Pharmacy Consult for Heparin  Indication: pulmonary embolus  Allergies  Allergen Reactions   Cefdinir Other (See Comments)    Abdominal pain   Hydrocodone -Acetaminophen  Nausea Only    Patient Measurements: Height: 5' 4 (162.6 cm) Weight: 48.9 kg (107 lb 12.9 oz) IBW/kg (Calculated) : 54.7 HEPARIN  DW (KG): 48.9  Vital Signs: Temp: 98.1 F (36.7 C) (09/19 0434) Temp Source: Oral (09/19 0434) BP: 155/71 (09/19 0600) Pulse Rate: 89 (09/19 0600)  Labs: Recent Labs    10/14/23 0325 10/14/23 0356 10/14/23 9367 10/14/23 1502 10/15/23 0320 10/15/23 1301 10/15/23 2102 10/16/23 0321 10/16/23 0407 10/16/23 0600  HGB 11.3*   < > 11.7*  --  11.4*  --   --  12.0  --   --   HCT 38.9   < > 39.6  --  37.3  --   --  38.6  --   --   PLT 204   < > 201  --  178  --   --  180  --   --   HEPARINUNFRC  --   --  0.38   < > 0.26* 0.38 0.46  --  0.49  --   CREATININE 0.56  --   --   --  0.60  --   --   --   --  0.60   < > = values in this interval not displayed.    Estimated Creatinine Clearance: 47.6 mL/min (by C-G formula based on SCr of 0.6 mg/dL).   Medical History: Past Medical History:  Diagnosis Date   COPD (chronic obstructive pulmonary disease) (HCC)    wears 2L most of the time   Coronary artery disease    Hypertension    Assessment: 64 yoF presenting with SOB and hemoptysis. Pt has no previous history of DVT or PE. CTA on 9/16 reveals PE with no associated right heart strain. Pt with history of CAD and on Plavix . Pt has no history of oral anticoagulant use. 9/17 Dopplers: neg DVT Aspirin  and clopidogrel  on hold   Today, 10/16/2023:  04:07 HL 0.49, therapeutic on heparin  950 units/hr CBC:  Hgb WNL, plts WNL No bleeding or complications of therapy noted.  Patient had reported only very minimal but improving hemoptysis yesterday; no changes today per nurse   Goal of Therapy:  Heparin  level 0.3-0.7 units/ml Monitor platelets by  anticoagulation protocol: Yes   Plan:  Continue heparin  IV infusion at 950 units/hr Daily heparin  level and CBC Follow up plans for long-term anticoagulation after 48 hours on heparin  if remains stable and not having worsening hemoptysis.    Thank you for allowing pharmacy to be a part of this patient's care.  Eleanor EMERSON Agent, PharmD, BCPS Clinical Pharmacist [facility] 10/16/2023 7:36 AM

## 2023-10-16 NOTE — Progress Notes (Signed)
   No new suggestions.  Awaiting plan for hemoptysis and will follow for resumption of DOAC and Plavix .  Please call over the weekend with questions.

## 2023-10-16 NOTE — Plan of Care (Signed)
 Patient was having labile oxygen saturations with a strong pleth wave, but without corresponding work of breathing/respiratory distress.  Changed pulse ox to right hand and issue resolved.  Remains at baseline 4L [place].  TXA nebs started today to reduce hemoptysis.

## 2023-10-16 NOTE — Progress Notes (Signed)
 NAME:  Kristy Weeks, MRN:  [id number], DOB:  [birth year], LOS: 2 ADMISSION DATE:  10/13/2023, CONSULTATION DATE:  10/14/2023 REFERRING MD:  Dr. Madelyne, CHIEF COMPLAINT:  PE, hemoptysis   History of Present Illness:  Ms. 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 CT PE protocol demonstrated right lower lobe segmental and subsegmental pulmonary emboli without associated right heart strain. CT chest showed extensive chronic changes throughout right lung including right upper lobe cavity, bronchiectasis, and mucous plugging, findings stable from prior study and consistent with chronic MAI. PCCM was consulted for further management of PE and hemoptysis.   She began coughing up blood yesterday afternoon. She describes the blood as bright red and saturating through multiple tissues, she said it may be about 1/3 of a cup. She has not had blood in her sputum before. She also has had intermittent chest pain and hotness around her back that comes and goes. It does not seem associated with taking a deep breath. She has a chronic cough which has not changed. She denies infectious symptoms since her recent COVID infection. She takes ASA and Plavix  at home and had PCI with stenting most recently in 12/2021.   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 She says breathing feels slightly worse. Cough quality has not changed, still coughing up blood- similar amount to yesterday.   Objective    Blood pressure (!) 155/71, pulse 89, temperature 98.2 F (36.8 C), temperature source Axillary, resp. rate (!) 21, height 5' 4 (1.626 m), weight 48.9 kg, SpO2 (!) 85%.        Intake/Output Summary (Last 24 hours) at 10/16/2023 0820 Last data filed at 10/16/2023 0600 Gross per 24 hour  Intake 322.86 ml  Output --  Net 322.86 ml   Filed Weights   10/13/23 1931 10/14/23 0107  Weight: 49 kg 48.9 kg    Examination: General: chronically ill appearing female, no acute distress, pale HENT: sclera anicteric, moist mucous membranes Lungs: breathing comfortably on high flow nasal cannula, rhonchi throughout right side, diminished left side Cardiovascular: regular rate and rhythm, normal S1 and S2, no m/r/g Abdomen: soft, non-tender, non-distended Extremities: warm, dry, no edema Neuro: alert and oriented x 3, non-focal GU: deferred  Resolved problem list   Assessment and Plan   Acute right lower lobe segmental and subsegmental PE Most likely provoked in the setting of recent COVID infection. No known cancer or immobility. Hemodynamically stable with normal RV on Echo. PESI score 84, Bova score 2 indicating low risk. No DVT on BLE duplex. Was increased to 15L HFNC this morning but no increased work of breathing and SpO2 improved when probe moved to different  finger, able to wean quickly to baseline 4L O2. CXR improved.  - Continue heparin  infusion, continue to monitor hemoptysis on heparin  infusion, would like to see hemoptysis improved before starting  DOAC. Low threshold to stop heparin  infusion should patient become hemodynamically unstable or have increasing volume of hemoptysis or dropping hemoglobin  - Appreciate cardiology consult, agree with stopping ASA and Plavix  for now  Hemoptysis  Anemia Unclear etiology. Most likely in the setting of chronic MAI infection with RUL cavitation versus PE. Hgb remains stable.  - Start inhaled TXA BID for now, can increase to q8h if hemoptysis ongoing  - Correction of coagulopathy as indicated - If ongoing hemoptysis or hemodynamically significant consider IR consult for bronchial artery embolization  - Continue to trend CBC  COPD complicated by chronic respiratory failure, with concern for acute exacerbation Chronic MAI infection with RUL cavitation  On baseline 4L oxygen and up to 5L with exertion. Worsening rhonci this morning and patient's breathing subjectively worse concerning for COPD exacerbation.  - Increase home prednisone  to 40 mg x 5 days - Continue home Azithromycin  - Continue home Incruse ellipta , budenoside and arformoterol   - Schedule Duo-nebs QID, hold albuterol  inhaler for now - Add 3% nebs BID  CAD s/p PCI (most recent 12/2021) On ASA and Plavix  at home. - Anti-platelet recommendations per cardiology   HTN - Per primary   Labs   CBC: Recent Labs  Lab 10/14/23 0325 10/14/23 0356 10/14/23 0632 10/15/23 0320 10/16/23 0321  WBC 7.5 DUP PER JAMES PA @0434  TW 8.5 8.2 8.7  NEUTROABS 4.8 PENDING  --   --   --   HGB 11.3* DUP PER JAMES PA @0434  TW 11.7* 11.4* 12.0  HCT 38.9 DUP PER JAMES PA @0434  TW 39.6 37.3 38.6  MCV 101.0* DUP PER JAMES PA @0434  TW 102.9* 100.5* 97.2  PLT 204 DUP PER JAMES PA @0434  TW 201 178 180    Basic Metabolic Panel: Recent Labs  Lab 10/13/23 1958 10/14/23 0325 10/15/23 0320 10/16/23 0600  NA 140 141 139 130*  K 4.8 3.9 4.0 4.6  CL 98 97* 97* 90*  CO2 33* 33* 33* 30  GLUCOSE 143* 125* 98 125*  BUN 20 19 15 14   CREATININE 0.64 0.56  0.60 0.60  CALCIUM  9.8 9.5 9.4 9.6   GFR: Estimated Creatinine Clearance: 47.6 mL/min (by C-G formula based on SCr of 0.6 mg/dL). Recent Labs  Lab 10/14/23 0356 10/14/23 0632 10/15/23 0320 10/16/23 0321  WBC DUP PER JAMES PA @0434  TW 8.5 8.2 8.7    Liver Function Tests: Recent Labs  Lab 10/14/23 0447  AST 20  ALT 9  ALKPHOS 52  BILITOT 0.4  PROT 6.1*  ALBUMIN 3.9   No results for input(s): LIPASE, AMYLASE in the last 168 hours. No results for input(s): AMMONIA in the last 168 hours.  ABG    Component Value Date/Time   HCO3 35.9 (H) 07/07/2023 1956   TCO2 38 (H) 07/07/2023 1956   O2SAT 97 07/07/2023 1956     Coagulation Profile: No results for input(s): INR, PROTIME in the last 168 hours.  Cardiac Enzymes: No results for input(s): CKTOTAL, CKMB, CKMBINDEX, TROPONINI in the last 168 hours.  HbA1C: Hgb A1c MFr Bld  Date/Time Value Ref Range Status  01/26/2018 11:21 AM 5.4 4.8 - 5.6 % Final    Comment:    (NOTE) Pre diabetes:          5.7%-6.4% Diabetes:              >  6.4% Glycemic control for   <7.0% adults with diabetes     CBG: No results for input(s): GLUCAP in the last 168 hours.      Rexene LOISE Blush, PA-C [facility] 10/16/23 8:20 AM  Please see [email or web address] for pager details.  From 7A-7P if no response, please call [phone] After hours, please call ELink [phone]

## 2023-10-17 DIAGNOSIS — J441 Chronic obstructive pulmonary disease with (acute) exacerbation: Secondary | ICD-10-CM | POA: Diagnosis not present

## 2023-10-17 DIAGNOSIS — R042 Hemoptysis: Secondary | ICD-10-CM | POA: Diagnosis not present

## 2023-10-17 DIAGNOSIS — J432 Centrilobular emphysema: Secondary | ICD-10-CM | POA: Diagnosis not present

## 2023-10-17 DIAGNOSIS — J984 Other disorders of lung: Secondary | ICD-10-CM | POA: Diagnosis not present

## 2023-10-17 LAB — CBC
HCT: 33.6 % — ABNORMAL LOW (ref 36.0–46.0)
Hemoglobin: 10.4 g/dL — ABNORMAL LOW (ref 12.0–15.0)
MCH: 30.6 pg (ref 26.0–34.0)
MCHC: 31 g/dL (ref 30.0–36.0)
MCV: 98.8 fL (ref 80.0–100.0)
Platelets: 193 K/uL (ref 150–400)
RBC: 3.4 MIL/uL — ABNORMAL LOW (ref 3.87–5.11)
RDW: 12.5 % (ref 11.5–15.5)
WBC: 6 K/uL (ref 4.0–10.5)
nRBC: 0 % (ref 0.0–0.2)

## 2023-10-17 LAB — BASIC METABOLIC PANEL WITH GFR
Anion gap: 9 (ref 5–15)
BUN: 15 mg/dL (ref 8–23)
CO2: 32 mmol/L (ref 22–32)
Calcium: 9.8 mg/dL (ref 8.9–10.3)
Chloride: 93 mmol/L — ABNORMAL LOW (ref 98–111)
Creatinine, Ser: 0.58 mg/dL (ref 0.44–1.00)
GFR, Estimated: >60 mL/min (ref >60)
Glucose, Bld: 124 mg/dL — ABNORMAL HIGH (ref 70–99)
Potassium: 4.6 mmol/L (ref 3.5–5.1)
Sodium: 134 mmol/L — ABNORMAL LOW (ref 135–145)

## 2023-10-17 LAB — HEPARIN LEVEL (UNFRACTIONATED): Heparin Unfractionated: 0.94 [IU]/mL — ABNORMAL HIGH (ref 0.30–0.70)

## 2023-10-17 MED ORDER — IPRATROPIUM-ALBUTEROL 0.5-2.5 (3) MG/3ML IN SOLN: 3.0000 mL | Freq: Three times a day (TID) | RESPIRATORY_TRACT | Status: DC

## 2023-10-17 MED ORDER — APIXABAN 5 MG PO TABS: 5.0000 mg | ORAL_TABLET | Freq: Two times a day (BID) | ORAL | Status: DC

## 2023-10-17 MED ORDER — APIXABAN 5 MG PO TABS
10.0000 mg | ORAL_TABLET | Freq: Two times a day (BID) | ORAL | Status: DC
  Administered 2023-10-17 – 2023-10-21 (×9): 10 mg via ORAL
  Filled 2023-10-17 (×9): qty 2

## 2023-10-17 MED ORDER — ALBUTEROL SULFATE (2.5 MG/3ML) 0.083% IN NEBU
2.5000 mg | INHALATION_SOLUTION | Freq: Three times a day (TID) | RESPIRATORY_TRACT | Status: DC
Start: 2023-10-17 — End: 2023-10-21
  Administered 2023-10-17 – 2023-10-21 (×12): 2.5 mg via RESPIRATORY_TRACT
  Filled 2023-10-17 (×13): qty 3

## 2023-10-17 MED ORDER — BOOST / RESOURCE BREEZE PO LIQD CUSTOM
1.0000 | Freq: Two times a day (BID) | ORAL | Status: DC
  Administered 2023-10-17 – 2023-10-20 (×7): 1 via ORAL

## 2023-10-17 NOTE — Progress Notes (Signed)
 NAME:  Gulianna Hornsby, MRN:  [id number], DOB:  March 05, 1950, LOS: 3 ADMISSION DATE:  10/13/2023, CONSULTATION DATE:  10/17/2023 REFERRING MD:  MELODIE, CHIEF COMPLAINT:  hemoptysis   History of Present Illness:  Mrs. Fichter is a 73 y/o F with a PMH significant for COPD c/b chronic hypoxic respiratory failure on 4 L O2 by [place], prior MAI RUL cavitary lesion, bronchiectasis, and severe CAD s/p PCI (12/2021) who presents for hemoptysis and found to have RLL segmental and subsegmental PE. Pulmonology consulted for evaluation and management of non-massive hemoptysis.   Pertinent  Medical History  As above  Significant Hospital Events: Including procedures, antibiotic start and stop dates in addition to other pertinent events   9/17: Admitted for PE and hemoptysis 9/18: Continuing to having hemoptysis, only slightly improved 9/19: hypoxic, hemoptysis improving, weaned down [place] from 12 to 4 L, started inhaled TXA and COPD exacerbation Tx  Interim History / Subjective:  Patient feels much better. Hemoptysis improved dramatically per patient but has not resolved completely. Remains on 4 L O2 by [place].  Objective    Blood pressure 116/78, pulse 80, temperature 98.1 F (36.7 C), temperature source Oral, resp. rate 17, height 5' 4 (1.626 m), weight 48.9 kg, SpO2 100%.    FiO2 (%):  [36 %-40 %] 40 %   Intake/Output Summary (Last 24 hours) at 10/17/2023 0943 Last data filed at 10/17/2023 0800 Gross per 24 hour  Intake 240.18 ml  Output 200 ml  Net 40.18 ml   Filed Weights   10/13/23 1931 10/14/23 0107  Weight: 49 kg 48.9 kg    Examination: General: well appearing, no distress HENT: anicteric sclera, well injected conjunctivae, PERRL, nasal and oral mucosa normal Lungs: wheezing improved dramatically Cardiovascular: Distant heart sounds Abdomen: soft, non-tender Extremities: no edema Neuro: A&O x 3 GU: Deferred  Resolved problem list   Assessment and Plan   1- Grade 3, Class B  COPD/Emphysema - Continue ICS/LAMA/LABA with budesonide /yupelri /brovana  - Albuterol  Q6 H   2- 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 Echo without evidence of RV strain. - Switch to DOAC and monitor patient for 24-48 hours. - Discontinue heparin  drip   4- CAD s/p PCI on DAPT - Appreciate cardiology consult and agree with stopping aspirin  for now.   5- Non massive hemoptysis > Unclear if patient's cause of hemoptysis is due to PE or from RUL cavitary lesion. There's no pulmonary infarct on chest imaging which makes hemoptysis from PE less likely. However, the timeline does fit. RUL cavitary lesion from MAI can cause hemoptysis spontaneously. If patient's hemoptysis persists or worsens over time while on full dose anticoagulation, may consider consulting IR for RUL bronchial artery embolization to prevent further hemoptysis. Inhaled TXA started on 9/19 and will end tonight (9/20). The patient likely needs to be monitored for 24 hours after that has been discontinued since she cannot use that at home.   6- COPD Exacerbation - Continue Prrednisone taper - Hypertonic saline BID - Duonebs QID   7- Chronic Hypercapnic Respiratory Failure - The patient would qualify for nocturnal BiPAP or AVAPs if not set up based on VBG results. Can start that in the hospital and upon discharge, can submit DME order for that to be done at home as well.  Labs   CBC: Recent Labs  Lab 10/14/23 0325 10/14/23 9643 10/14/23 9367 10/15/23 0320 10/16/23 0321 10/17/23 0304  WBC 7.5 DUP PER JAMES PA @0434  TW 8.5 8.2 8.7 6.0  NEUTROABS 4.8 PENDING  --   --   --   --   HGB 11.3* DUP PER JAMES PA @0434  TW 11.7* 11.4* 12.0 10.4*  HCT 38.9 DUP PER JAMES PA @0434  TW 39.6 37.3 38.6 33.6*  MCV 101.0* DUP PER JAMES PA @0434  TW 102.9* 100.5* 97.2 98.8  PLT 204 DUP PER JAMES PA @0434  TW 201 178 180 193    Basic Metabolic  Panel: Recent Labs  Lab 10/13/23 1958 10/14/23 0325 10/15/23 0320 10/16/23 0600 10/17/23 0304  NA 140 141 139 130* 134*  K 4.8 3.9 4.0 4.6 4.6  CL 98 97* 97* 90* 93*  CO2 33* 33* 33* 30 32  GLUCOSE 143* 125* 98 125* 124*  BUN 20 19 15 14 15   CREATININE 0.64 0.56 0.60 0.60 0.58  CALCIUM  9.8 9.5 9.4 9.6 9.8   GFR: Estimated Creatinine Clearance: 47.6 mL/min (by C-G formula based on SCr of 0.58 mg/dL). Recent Labs  Lab 10/14/23 0632 10/15/23 0320 10/16/23 0321 10/17/23 0304  WBC 8.5 8.2 8.7 6.0    Liver Function Tests: Recent Labs  Lab 10/14/23 0447  AST 20  ALT 9  ALKPHOS 52  BILITOT 0.4  PROT 6.1*  ALBUMIN 3.9   No results for input(s): LIPASE, AMYLASE in the last 168 hours. No results for input(s): AMMONIA in the last 168 hours.  ABG    Component Value Date/Time   HCO3 38.4 (H) 10/16/2023 0832   TCO2 38 (H) 07/07/2023 1956   O2SAT 85.9 10/16/2023 0832     Coagulation Profile: No results for input(s): INR, PROTIME in the last 168 hours.  Cardiac Enzymes: No results for input(s): CKTOTAL, CKMB, CKMBINDEX, TROPONINI in the last 168 hours.  HbA1C: Hgb A1c MFr Bld  Date/Time Value Ref Range Status  01/26/2018 11:21 AM 5.4 4.8 - 5.6 % Final    Comment:    (NOTE) Pre diabetes:          5.7%-6.4% Diabetes:              >6.4% Glycemic control for   <7.0% adults with diabetes     CBG: No results for input(s): GLUCAP in the last 168 hours.  Review of Systems:   Not obtained  Past Medical History:  She,  has a past medical history of COPD (chronic obstructive pulmonary disease) (HCC), Coronary artery disease, and Hypertension.   Surgical History:   Past Surgical History:  Procedure Laterality Date   CORONARY ANGIOPLASTY WITH STENT PLACEMENT       Social History:   reports that she quit smoking about 7 years ago. Her smoking use included cigarettes. She started smoking about 47 years ago. She has a 40 pack-year smoking  history. She has never used smokeless tobacco. She reports that she does not drink alcohol and does not use drugs.   Family History:  Her family history includes CAD in her brother and father; Heart attack (age of onset: 71 - 32) in her father; Heart failure (age of onset: 5) in her father.   Allergies Allergies  Allergen Reactions   Cefdinir Other (See Comments)    Abdominal pain   Hydrocodone -Acetaminophen  Nausea Only     Home Medications  Prior to Admission medications   Medication Sig Start Date End Date Taking? Authorizing Provider  acetaminophen  (TYLENOL ) 500 MG tablet Take 1,000 mg by mouth every 6 (six) hours as needed for moderate pain (pain score 4-6)  or mild pain (pain score 1-3).   Yes [provider]  albuterol  (VENTOLIN  HFA) 108 (90 Base) MCG/ACT inhaler Inhale 2 puffs into the lungs every 4 (four) hours as needed for wheezing or shortness of breath. 04/12/23  Yes [provider]  ALPRAZolam  (XANAX ) 0.5 MG tablet Take 0.25 mg by mouth 2 (two) times daily as needed for sleep or anxiety. 07/07/23  Yes [provider]  aspirin  EC 81 MG tablet Take 81 mg by mouth daily.   Yes [provider]  atorvastatin  (LIPITOR) 40 MG tablet Take 40 mg by mouth every evening.   Yes [provider]  azithromycin  (ZITHROMAX ) 500 MG tablet Take 500 mg by mouth every Monday, Wednesday, and Friday.   Yes [provider]  benzonatate (TESSALON) 100 MG capsule Take 100 mg by mouth 3 (three) times daily as needed for cough. 06/13/23  Yes [provider]  budesonide  (PULMICORT ) 0.5 MG/2ML nebulizer solution Take 0.5 mg by nebulization every 12 (twelve) hours.   Yes [provider]  carvedilol  (COREG ) 12.5 MG tablet Take 6.25 mg by mouth in the morning and at bedtime.   Yes [provider]  clopidogrel  (PLAVIX ) 75 MG tablet Take 75 mg by mouth in the morning.   Yes [provider]  famotidine  (PEPCID ) 20 MG tablet  Take 20 mg by mouth 2 (two) times daily as needed for heartburn. 06/01/23  Yes [provider]  folic acid  (FOLVITE ) 1 MG tablet Take 1 mg by mouth daily. 03/13/22  Yes [provider]  formoterol  (PERFOROMIST ) 20 MCG/2ML nebulizer solution Take 20 mcg by nebulization 2 (two) times daily.   Yes [provider]  ipratropium-albuterol  (DUONEB) 0.5-2.5 (3) MG/3ML SOLN Take 3 mLs by nebulization See admin instructions. Nebulize 3 ml's and inhale into the lungs every 5 hours   Yes [provider]  lisinopril  (ZESTRIL ) 5 MG tablet Take 5 mg by mouth every evening.   Yes [provider]  nitroGLYCERIN  (NITROSTAT ) 0.4 MG SL tablet Place 0.4 mg under the tongue every 5 (five) minutes as needed for chest pain. 04/23/23  Yes [provider]  OXYGEN Inhale 4 L/min into the lungs continuous.   Yes [provider]  predniSONE  (DELTASONE ) 5 MG tablet Take 1-2 tablets (5-10 mg total) by mouth See admin instructions. Take 5 mg by mouth on daily and then take 10 mg by mouth once daily the next day. Alternating doses. Patient taking differently: Take 5-10 mg by mouth See admin instructions. Take 5 mg by mouth with breakfast and ALTERNATE WITH the 10 mg strength EVERY OTHER MORNING 07/18/23  Yes Pokhrel, Laxman, MD  YUPELRI  175 MCG/3ML nebulizer solution Take 175 mcg by nebulization in the morning.   Yes [provider]  amoxicillin -clavulanate (AUGMENTIN ) 875-125 MG tablet Take 1 tablet by mouth 2 (two) times daily. Patient not taking: Reported on 10/14/2023 07/10/23   Pokhrel, Laxman, MD  predniSONE  (DELTASONE ) 10 MG tablet Take 4 tablets (40 mg) daily for 2 days, then, Take 3 tablets (30 mg) daily for 2 days, then, Take 2 tablets (20 mg) daily for 2 days, then, Take 1 tablets (10 mg) daily for 1 days, then stop Patient not taking: Reported on 10/14/2023 07/10/23   Sonjia Held, MD     Thank you for this interesting consult. I have spent 45 minutes  evaluating patient, reviewing chart, and discussing plan of care with patient, family, and primary medical team. If you have any questions or concerns please  reach out to me via pager ([phone]).  Paula Southerly, MD [facility]

## 2023-10-17 NOTE — Progress Notes (Signed)
 PHARMACY - ANTICOAGULATION CONSULT NOTE  Pharmacy Consult for Heparin  Indication: pulmonary embolus  Allergies  Allergen Reactions   Cefdinir Other (See Comments)    Abdominal pain   Hydrocodone -Acetaminophen  Nausea Only    Patient Measurements: Height: 5' 4 (162.6 cm) Weight: 48.9 kg (107 lb 12.9 oz) IBW/kg (Calculated) : 54.7 HEPARIN  DW (KG): 48.9  Vital Signs: Temp: 98.2 F (36.8 C) (09/20 0000) Temp Source: Oral (09/20 0000) BP: 104/68 (09/19 2300) Pulse Rate: 64 (09/19 2300)  Labs: Recent Labs    10/15/23 0320 10/15/23 1301 10/15/23 2102 10/16/23 0321 10/16/23 0407 10/16/23 0600 10/17/23 0304  HGB 11.4*  --   --  12.0  --   --  10.4*  HCT 37.3  --   --  38.6  --   --  33.6*  PLT 178  --   --  180  --   --  193  HEPARINUNFRC 0.26*   < > 0.46  --  0.49  --  0.94*  CREATININE 0.60  --   --   --   --  0.60  --    < > = values in this interval not displayed.    Estimated Creatinine Clearance: 47.6 mL/min (by C-G formula based on SCr of 0.6 mg/dL).   Medical History: Past Medical History:  Diagnosis Date   COPD (chronic obstructive pulmonary disease) (HCC)    wears 2L most of the time   Coronary artery disease    Hypertension    Assessment: 51 yoF presenting with SOB and hemoptysis. Pt has no previous history of DVT or PE. CTA on 9/16 reveals PE with no associated right heart strain. Pt with history of CAD and on Plavix . Pt has no history of oral anticoagulant use. 9/17 Dopplers: neg DVT Aspirin  and clopidogrel  on hold   Today, 10/17/2023:  HL 0.94 supra-therapeutic on 950 units/hr Hgb down 10.4, plts 193  TXA nebs started 9/19 to reduce hemoptysis.  RN reports no noted bleeding   Goal of Therapy:  Heparin  level 0.3-0.7 units/ml Monitor platelets by anticoagulation protocol: Yes   Plan:  Decrease heparin  drip to 850 units/hr Heparin  level in 8 hours Daily heparin  level and CBC Follow up plans for long-term anticoagulation after 48 hours on  heparin  if remains stable and not having worsening hemoptysis.    Thank you for allowing pharmacy to be a part of this patient's care.  Leeroy Mace RPh 10/17/2023, 3:55 AM

## 2023-10-17 NOTE — Progress Notes (Signed)
 PROGRESS NOTE    Kristy Weeks  FMW:[id number] DOB: 1950/05/08 DOA: 10/13/2023 PCP: [name], PA-C   Brief Narrative: 73 year old with past medical history significant for chronic respiratory failure on 4 L of oxygen secondary to COPD, chronic cavitary lung lesion (MAC) and bronchiectasis, CAD status post PCI December 2023, hypertension, recent admission in July for respiratory failure in the setting of COVID, has been on prolonged antibiotics presents complaining of hemoptysis.  In the ED she required 6 L of oxygen but subsequently was able to transition down to 4 L.  CT show right segmental and subsegmental PE and demonstrated large cavitary lesion with chronic consolidation.   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  -CT angio chest:  Interval development of a right lower lobe segmental and subsegmental pulmonary embolus. Redemonstration of large cavitary right upper lobe lesion that is continuous with a chronic right lower lobe consolidation and surrounding satellite nodular airspace opacities in the right lower lobe and right middle lobes. No significant change. -Pulmonologist consulted due to hemoptysis/ Appreciate assistance. If patient hemoptysis worsen may consider IRR consultation for RUL bronchial artery embolization.  -ECHO: Normal RV function and LE doppler: negative.  -Hb has remain stable. 9/19: Required transiently higher amount of oxygen, but might have been probe was not working. Chest x ray stable. BNP not worsen, troponin flat.  Plan to transition to  Eliquis  and monitor for 24--48 hours.   History of cavitary lung lesion: History of Mycobacterium Avium complex infection Hemoptysis.  -Resume Azithromycin .  -Appreciate pulmonologist assistance.  Respiratory toilet (vest),  hypertonic saline,  Tranexamic acid  inhaler.  Hemoptysis improving.   CAD status post PCI 2023 -Patient still on dual antiplatelet therapy.  -Cardiology consulted, in regards dual antiplatelet therapy tx for CAD, now that patient will required NOAC and  has Hemoptysis.   -Continue lipitor./  -appreciate cardiology recommendations. Plan to hold aspirin  and plavix . Resume plavix  when able.   Chronic hypoxic respiratory failure, on 4 L of oxygen at home, COPD -Continue pulmicort , brovana , Incruse, schedule albuterol .  -Continue Guaifenesin .  -Resume Azithromycin .  -Advised she will need close follow up with Pulmonologist, could benefit from repeat bronchoscopy bx due to recent PE>  -On  prednisone  taper dose for COPD.    Hypertension: -Continue carvedilol / resume lisinopril , BP elevated   Hyponatremia; monitor. Improved.    Estimated body mass index is 18.5 kg/m as calculated from the following:   Height as of this encounter: 5' 4 (1.626 m).   Weight as of this encounter: 48.9 kg.   DVT prophylaxis: heparin  gtt Code Status: full code Family Communication: Care dicussed with patient.  Disposition Plan:  Status is: Inpatient Remains inpatient appropriate because: management of PE    Consultants:  Cardiology Pulmonologist   Procedures:  ECHO Doppler  Antimicrobials:    Subjective: She is feeling much better today, denies nausea, no more vomiting.  She reports hemoptysis has improved.  He feels that she is breathing better.  Objective: Vitals:   10/17/23 0500 10/17/23 0600 10/17/23 0700 10/17/23 0800  BP: 117/77 122/73 136/73 111/65  Pulse: 88 70 73 80  Resp:  20 20 17   Temp:    98.1 F (36.7 C)  TempSrc:    Oral  SpO2: (!) 81% 100%  100% 99%  Weight:      Height:        Intake/Output Summary (Last 24 hours) at 10/17/2023 0811 Last data filed at 10/17/2023 0700 Gross per 24 hour  Intake 232.08 ml  Output 200 ml  Net 32.08 ml   Filed Weights   10/13/23 1931 10/14/23 0107  Weight: 49 kg 48.9 kg    Examination:  General exam: No acute distress Respiratory  system: Normal respiratory effort, bilateral rhonchus  Cardiovascular system: S1-S2 regular rhythm rate Gastrointestinal system: BS present, soft, nt Central nervous system: Alert, conversant Extremities: No edema    Data Reviewed: I have personally reviewed following labs and imaging studies  CBC: Recent Labs  Lab 10/14/23 0325 10/14/23 0356 10/14/23 0632 10/15/23 0320 10/16/23 0321 10/17/23 0304  WBC 7.5 DUP PER JAMES PA @0434  TW 8.5 8.2 8.7 6.0  NEUTROABS 4.8 PENDING  --   --   --   --   HGB 11.3* DUP PER JAMES PA @0434  TW 11.7* 11.4* 12.0 10.4*  HCT 38.9 DUP PER JAMES PA @0434  TW 39.6 37.3 38.6 33.6*  MCV 101.0* DUP PER JAMES PA @0434  TW 102.9* 100.5* 97.2 98.8  PLT 204 DUP PER JAMES PA @0434  TW 201 178 180 193   Basic Metabolic Panel: Recent Labs  Lab 10/13/23 1958 10/14/23 0325 10/15/23 0320 10/16/23 0600 10/17/23 0304  NA 140 141 139 130* 134*  K 4.8 3.9 4.0 4.6 4.6  CL 98 97* 97* 90* 93*  CO2 33* 33* 33* 30 32  GLUCOSE 143* 125* 98 125* 124*  BUN 20 19 15 14 15   CREATININE 0.64 0.56 0.60 0.60 0.58  CALCIUM  9.8 9.5 9.4 9.6 9.8   GFR: Estimated Creatinine Clearance: 47.6 mL/min (by C-G formula based on SCr of 0.58 mg/dL). Liver Function Tests: Recent Labs  Lab 10/14/23 0447  AST 20  ALT 9  ALKPHOS 52  BILITOT 0.4  PROT 6.1*  ALBUMIN 3.9   No results for input(s): LIPASE, AMYLASE in the last 168 hours. No results for input(s): AMMONIA in the last 168 hours. Coagulation Profile: No results for input(s): INR, PROTIME in the last 168 hours. Cardiac Enzymes: No results for input(s): CKTOTAL, CKMB,  CKMBINDEX, TROPONINI in the last 168 hours. BNP (last 3 results) Recent Labs    07/07/23 1942 10/14/23 0447 10/16/23 0832  PROBNP 655.0* 995.0* 588.0*   HbA1C: No results for input(s): HGBA1C in the last 72 hours. CBG: No results for input(s): GLUCAP in the last 168 hours. Lipid Profile: No results for input(s): CHOL, HDL, LDLCALC, TRIG, CHOLHDL, LDLDIRECT in the last 72 hours. Thyroid Function Tests: No results for input(s): TSH, T4TOTAL, FREET4, T3FREE, THYROIDAB in the last 72 hours. Anemia Panel: Recent Labs    10/16/23 0832 10/16/23 1237  VITAMINB12  --  548  FOLATE >20.0  --    Sepsis Labs: No results for input(s): PROCALCITON, LATICACIDVEN in the last 168 hours.  Recent Results (from the past 240 hours)  MRSA Next Gen by PCR, Nasal     Status: None   Collection Time: 10/14/23  1:02 AM   Specimen: Nasal Mucosa; Nasal Swab  Result Value Ref Range Status   MRSA by PCR Next Gen NOT DETECTED NOT DETECTED Final    Comment: (NOTE) The GeneXpert MRSA Assay (FDA approved for NASAL specimens only), is one component of a  comprehensive MRSA colonization surveillance program. It is not intended to diagnose MRSA infection nor to guide or monitor treatment for MRSA infections. Test performance is not FDA approved in patients less than 49 years old. Performed at [facility], [address] [place], [place] [address]          Radiology Studies: DG Abd 1 View Result Date: 10/16/2023 CLINICAL DATA:  Nausea. EXAM: ABDOMEN - 1 VIEW COMPARISON:  None Available. FINDINGS: No abnormal bowel dilatation is noted. Mild amount of stool seen throughout the colon. IMPRESSION: No abnormal bowel dilatation. Electronically Signed   By: Lynwood Landy Raddle M.D.   On: 10/16/2023 08:23   DG CHEST PORT 1 VIEW Result Date: 10/16/2023 CLINICAL DATA:  Acute hypoxic respiratory failure. EXAM: PORTABLE CHEST 1 VIEW COMPARISON:  August 03, 2023.   October 13, 2023. FINDINGS: Stable cardiomediastinal silhouette. Stable cavitary abnormality seen in right upper lobe which may represent either cavitary malignancy or pneumonia. Emphysematous disease is noted. Mild right basilar atelectasis, scarring or inflammation is noted. Bony thorax is unremarkable. IMPRESSION: Stable cavitary abnormality seen in right upper lobe which may represent either cavitary malignancy or pneumonia. Mild right basilar atelectasis, scarring or inflammation is noted. Electronically Signed   By: Lynwood Landy Raddle M.D.   On: 10/16/2023 08:23        Scheduled Meds:  ALPRAZolam   0.25-0.5 mg Oral QHS   arformoterol   15 mcg Nebulization BID   atorvastatin   40 mg Oral QHS   azithromycin   500 mg Oral Q M,W,F   budesonide   0.5 mg Nebulization BID   carvedilol   6.25 mg Oral BID WC   Chlorhexidine  Gluconate Cloth  6 each Topical Daily   folic acid   1 mg Oral Daily   guaiFENesin   1,200 mg Oral BID   ipratropium-albuterol   3 mL Nebulization QID   lisinopril   5 mg Oral Daily   pantoprazole  (PROTONIX ) IV  40 mg Intravenous Q12H   predniSONE   40 mg Oral Q breakfast   Followed by   NOREEN ON 10/18/2023] predniSONE   30 mg Oral Q breakfast   Followed by   NOREEN ON 10/20/2023] predniSONE   20 mg Oral Q breakfast   Followed by   NOREEN ON 10/22/2023] predniSONE   10 mg Oral Q breakfast   revefenacin   175 mcg Nebulization Daily   sodium chloride  HYPERTONIC  4 mL Nebulization BID   tranexamic acid   500 mg Nebulization BID   umeclidinium bromide   1 puff Inhalation Daily   Continuous Infusions:  heparin  Stopped (10/17/23 0655)     LOS: 3 days    Time spent: 35 minutes    Chenae Brager A Jaelynn Currier, MD [facility]   If 7PM-7AM, please contact night-coverage [email or web address]  10/17/2023, 8:11 AM

## 2023-10-17 NOTE — Discharge Instructions (Signed)
 Information on my medicine - ELIQUIS  (apixaban )  This medication education was reviewed with me or my healthcare representative as part of my discharge preparation.  Why was Eliquis  prescribed for you? Eliquis  was prescribed to treat blood clots that may have been found in the veins of your legs (deep vein thrombosis) or in your lungs (pulmonary embolism) and to reduce the risk of them occurring again.  What do You need to know about Eliquis  ? The starting dose is 10 mg (two 5 mg tablets) taken TWICE daily for the FIRST SEVEN (7) DAYS, then on  10/24/2023  the dose is reduced to ONE 5 mg tablet taken TWICE daily.  Eliquis  may be taken with or without food.   Try to take the dose about the same time in the morning and in the evening. If you have difficulty swallowing the tablet whole please discuss with your pharmacist how to take the medication safely.  Take Eliquis  exactly as prescribed and DO NOT stop taking Eliquis  without talking to the doctor who prescribed the medication.  Stopping may increase your risk of developing a new blood clot.  Refill your prescription before you run out.  After discharge, you should have regular check-up appointments with your healthcare provider that is prescribing your Eliquis .    What do you do if you miss a dose? If a dose of ELIQUIS  is not taken at the scheduled time, take it as soon as possible on the same day and twice-daily administration should be resumed. The dose should not be doubled to make up for a missed dose.  Important Safety Information A possible side effect of Eliquis  is bleeding. You should call your healthcare provider right away if you experience any of the following: Bleeding from an injury or your nose that does not stop. Unusual colored urine (red or dark brown) or unusual colored stools (red or black). Unusual bruising for unknown reasons. A serious fall or if you hit your head (even if there is no bleeding).  Some  medicines may interact with Eliquis  and might increase your risk of bleeding or clotting while on Eliquis . To help avoid this, consult your healthcare provider or pharmacist prior to using any new prescription or non-prescription medications, including herbals, vitamins, non-steroidal anti-inflammatory drugs (NSAIDs) and supplements.  This website has more information on Eliquis  (apixaban ): [email or web address] .com/eliquis dena

## 2023-10-17 NOTE — Progress Notes (Signed)
 PHARMACY - ANTICOAGULATION CONSULT NOTE  Pharmacy Consult to transition Heparin  to apixaban  Indication: pulmonary embolus  Allergies  Allergen Reactions   Cefdinir Other (See Comments)    Abdominal pain   Hydrocodone -Acetaminophen  Nausea Only    Patient Measurements: Height: 5' 4 (162.6 cm) Weight: 48.9 kg (107 lb 12.9 oz) IBW/kg (Calculated) : 54.7 HEPARIN  DW (KG): 48.9  Vital Signs: Temp: 98.1 F (36.7 C) (09/20 0800) Temp Source: Oral (09/20 0800) BP: 116/78 (09/20 0834) Pulse Rate: 80 (09/20 0800)  Labs: Recent Labs    10/15/23 0320 10/15/23 1301 10/15/23 2102 10/16/23 0321 10/16/23 0407 10/16/23 0600 10/17/23 0304  HGB 11.4*  --   --  12.0  --   --  10.4*  HCT 37.3  --   --  38.6  --   --  33.6*  PLT 178  --   --  180  --   --  193  HEPARINUNFRC 0.26*   < > 0.46  --  0.49  --  0.94*  CREATININE 0.60  --   --   --   --  0.60 0.58   < > = values in this interval not displayed.    Estimated Creatinine Clearance: 47.6 mL/min (by C-G formula based on SCr of 0.58 mg/dL).   Medical History: Past Medical History:  Diagnosis Date   COPD (chronic obstructive pulmonary disease) (HCC)    wears 2L most of the time   Coronary artery disease    Hypertension    Assessment: 12 yoF presenting with SOB and hemoptysis. Pt has no previous history of DVT or PE. CTA on 9/16 reveals PE with no associated right heart strain. Pt with history of CAD and on Plavix . Pt has no history of oral anticoagulant use. 9/17 Dopplers: neg DVT Aspirin  and clopidogrel  on hold   Today, 10/17/2023:  03:04 HL 0.94 supra-therapeutic on 950 units/hr and rate decreased to 850 units/hr Hgb down 10.4, plts 193  TXA nebs started 9/19 to reduce hemoptysis.  RN reported no noted bleeding   Goal of Therapy:  Heparin  level 0.3-0.7 units/ml Monitor platelets by anticoagulation protocol: Yes   Plan:  Discontinue IV heparin  (at the time apixaban  is started) and heparin  levels Apixaban  10 mg po  BID for 7 days followed by 5 mg po BID With no dose adjustment anticipated, Pharmacy will discontinue apixaban  consult and continue to monitor CBC as ordered by provider along with signs/symptoms of bleeding. .  Thank you for allowing pharmacy to be a part of this patient's care.  Eleanor EMERSON Agent, PharmD, BCPS Clinical Pharmacist [place] 10/17/2023 9:49 AM

## 2023-10-18 ENCOUNTER — Inpatient Hospital Stay (HOSPITAL_COMMUNITY)

## 2023-10-18 DIAGNOSIS — R042 Hemoptysis: Secondary | ICD-10-CM | POA: Diagnosis not present

## 2023-10-18 DIAGNOSIS — J441 Chronic obstructive pulmonary disease with (acute) exacerbation: Secondary | ICD-10-CM | POA: Diagnosis not present

## 2023-10-18 DIAGNOSIS — J984 Other disorders of lung: Secondary | ICD-10-CM | POA: Diagnosis not present

## 2023-10-18 DIAGNOSIS — J432 Centrilobular emphysema: Secondary | ICD-10-CM | POA: Diagnosis not present

## 2023-10-18 LAB — CBC
HCT: 33.5 % — ABNORMAL LOW (ref 36.0–46.0)
Hemoglobin: 10.4 g/dL — ABNORMAL LOW (ref 12.0–15.0)
MCH: 30.8 pg (ref 26.0–34.0)
MCHC: 31 g/dL (ref 30.0–36.0)
MCV: 99.1 fL (ref 80.0–100.0)
Platelets: 185 K/uL (ref 150–400)
RBC: 3.38 MIL/uL — ABNORMAL LOW (ref 3.87–5.11)
RDW: 12.6 % (ref 11.5–15.5)
WBC: 7.8 K/uL (ref 4.0–10.5)
nRBC: 0 % (ref 0.0–0.2)

## 2023-10-18 MED ORDER — POLYETHYLENE GLYCOL 3350 17 G PO PACK
17.0000 g | PACK | Freq: Every day | ORAL | Status: DC
  Administered 2023-10-18 – 2023-10-20 (×2): 17 g via ORAL
  Filled 2023-10-18 (×4): qty 1

## 2023-10-18 MED ORDER — SENNOSIDES-DOCUSATE SODIUM 8.6-50 MG PO TABS
1.0000 | ORAL_TABLET | Freq: Two times a day (BID) | ORAL | Status: DC
  Filled 2023-10-18 (×7): qty 1

## 2023-10-18 MED ORDER — ALBUTEROL SULFATE (2.5 MG/3ML) 0.083% IN NEBU
2.5000 mg | INHALATION_SOLUTION | RESPIRATORY_TRACT | Status: DC | PRN
  Administered 2023-10-18 – 2023-10-19 (×2): 2.5 mg via RESPIRATORY_TRACT
  Filled 2023-10-18 (×2): qty 3

## 2023-10-18 NOTE — Progress Notes (Addendum)
 PT Cancellation Note  Patient Details Name: Kristy Weeks MRN: [id number] DOB: July 21, 1950   Cancelled Treatment:    Reason Eval/Treat Not Completed: Patient declined, no reason specified 9:50 am Pt states absolutely not right now upon introduction.  Will check back as schedule permits.  14:20 Pt declines again, eating.   Tari CROME [place] 10/18/2023, 10:28 AM Tari KLEIN, DPT Physical Therapist [facility] Office: [phone]

## 2023-10-18 NOTE — Plan of Care (Signed)

## 2023-10-18 NOTE — Progress Notes (Signed)
 NAME:  Kristy Weeks, MRN:  [id number], DOB:  14-Aug-1950, LOS: 4 ADMISSION DATE:  10/13/2023, CONSULTATION DATE:  10/18/2023 REFERRING MD:  MELODIE, CHIEF COMPLAINT:  hemoptysis   History of Present Illness:  Kristy Weeks is a 73 y/o F with a PMH significant for COPD c/b chronic hypoxic respiratory failure on 4 L O2 by [place], prior MAI RUL cavitary lesion, bronchiectasis, and severe CAD s/p PCI (12/2021) who presents for hemoptysis and found to have RLL segmental and subsegmental PE. Pulmonology consulted for evaluation and management of non-massive hemoptysis.   Pertinent  Medical History  As above  Significant Hospital Events: Including procedures, antibiotic start and stop dates in addition to other pertinent events   9/17: Admitted for PE and hemoptysis 9/18: Continuing to having hemoptysis, only slightly improved 9/19: hypoxic, hemoptysis improving, weaned down [place] from 12 to 4 L, started inhaled TXA and COPD exacerbation Tx 9/20: hemoptysis improved, started DOAC  Interim History / Subjective:  Patient notes that she feels well. Still coughing up some bloody sputum but the blood looks dark/old.  Objective    Blood pressure (!) 152/82, pulse 78, temperature 97.6 F (36.4 C), temperature source Oral, resp. rate (!) 21, height 5' 4 (1.626 m), weight 48.9 kg, SpO2 100%.    FiO2 (%):  [36 %-40 %] 36 %   Intake/Output Summary (Last 24 hours) at 10/18/2023 0801 Last data filed at 10/17/2023 2234 Gross per 24 hour  Intake 258.25 ml  Output --  Net 258.25 ml   Filed Weights   10/13/23 1931 10/14/23 0107  Weight: 49 kg 48.9 kg    Examination: General: well appearing, no distress HENT: anicteric sclera, well injected conjunctivae, PERRL, nasal and oral mucosa normal Lungs: scattered wheezes Cardiovascular: Distant heart sounds Abdomen: soft, non-tender Extremities: no edema Neuro: A&O x 3 GU: Deferred  Resolved problem list   Assessment and Plan   1- Grade 3, Class B  COPD/Emphysema - Continue ICS/LAMA/LABA with budesonide /yupelri /brovana  - Albuterol  Q6 H   2- 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 Echo without evidence of RV strain. - Continue Eliquis    4- CAD s/p PCI on DAPT - Appreciate cardiology consult and agree with stopping aspirin  for now.   5- Non massive hemoptysis > Unclear if patient's cause of hemoptysis is due to PE or from RUL cavitary lesion. There's no pulmonary infarct on chest imaging which makes hemoptysis from PE less likely. However, the timeline does fit. RUL cavitary lesion from MAI can cause hemoptysis spontaneously. If patient's hemoptysis persists or worsens over time while on full dose anticoagulation, may consider consulting IR for RUL bronchial artery embolization to prevent further hemoptysis. Inhaled TXA started on 9/19 and ended on 9/20. The patient needs to be monitored for 24 hours after TXA has been discontinued since she cannot use it home.   6- COPD Exacerbation - Continue Prrednisone taper - Hypertonic saline BID - Duonebs QID   7- Chronic Hypercapnic Respiratory Failure - The patient would qualify for nocturnal BiPAP or AVAPs if not set up based on VBG results. Can start that in the hospital and upon discharge, can submit DME order for that to be done at home as well.  Labs   CBC: Recent Labs  Lab 10/14/23 0325 10/14/23 0356 10/14/23 0632 10/15/23 0320 10/16/23 0321 10/17/23 0304 10/18/23 0544  WBC 7.5 DUP PER JAMES PA @0434  TW 8.5 8.2  8.7 6.0 7.8  NEUTROABS 4.8 PENDING  --   --   --   --   --   HGB 11.3* DUP PER JAMES PA @0434  TW 11.7* 11.4* 12.0 10.4* 10.4*  HCT 38.9 DUP PER JAMES PA @0434  TW 39.6 37.3 38.6 33.6* 33.5*  MCV 101.0* DUP PER JAMES PA @0434  TW 102.9* 100.5* 97.2 98.8 99.1  PLT 204 DUP PER JAMES PA @0434  TW 201 178 180 193 185    Basic Metabolic Panel: Recent Labs  Lab 10/13/23 1958  10/14/23 0325 10/15/23 0320 10/16/23 0600 10/17/23 0304  NA 140 141 139 130* 134*  K 4.8 3.9 4.0 4.6 4.6  CL 98 97* 97* 90* 93*  CO2 33* 33* 33* 30 32  GLUCOSE 143* 125* 98 125* 124*  BUN 20 19 15 14 15   CREATININE 0.64 0.56 0.60 0.60 0.58  CALCIUM  9.8 9.5 9.4 9.6 9.8   GFR: Estimated Creatinine Clearance: 47.6 mL/min (by C-G formula based on SCr of 0.58 mg/dL). Recent Labs  Lab 10/15/23 0320 10/16/23 0321 10/17/23 0304 10/18/23 0544  WBC 8.2 8.7 6.0 7.8    Liver Function Tests: Recent Labs  Lab 10/14/23 0447  AST 20  ALT 9  ALKPHOS 52  BILITOT 0.4  PROT 6.1*  ALBUMIN 3.9   No results for input(s): LIPASE, AMYLASE in the last 168 hours. No results for input(s): AMMONIA in the last 168 hours.  ABG    Component Value Date/Time   HCO3 38.4 (H) 10/16/2023 0832   TCO2 38 (H) 07/07/2023 1956   O2SAT 85.9 10/16/2023 0832     Coagulation Profile: No results for input(s): INR, PROTIME in the last 168 hours.  Cardiac Enzymes: No results for input(s): CKTOTAL, CKMB, CKMBINDEX, TROPONINI in the last 168 hours.  HbA1C: Hgb A1c MFr Bld  Date/Time Value Ref Range Status  01/26/2018 11:21 AM 5.4 4.8 - 5.6 % Final    Comment:    (NOTE) Pre diabetes:          5.7%-6.4% Diabetes:              >6.4% Glycemic control for   <7.0% adults with diabetes     CBG: No results for input(s): GLUCAP in the last 168 hours.  Review of Systems:   Not obtained  Past Medical History:  She,  has a past medical history of COPD (chronic obstructive pulmonary disease) (HCC), Coronary artery disease, and Hypertension.   Surgical History:   Past Surgical History:  Procedure Laterality Date   CORONARY ANGIOPLASTY WITH STENT PLACEMENT       Social History:   reports that she quit smoking about 7 years ago. Her smoking use included cigarettes. She started smoking about 47 years ago. She has a 40 pack-year smoking history. She has never used smokeless tobacco.  She reports that she does not drink alcohol and does not use drugs.   Family History:  Her family history includes CAD in her brother and father; Heart attack (age of onset: 84 - 67) in her father; Heart failure (age of onset: 8) in her father.   Allergies Allergies  Allergen Reactions   Cefdinir Other (See Comments)    Abdominal pain   Hydrocodone -Acetaminophen  Nausea Only     Home Medications  Prior to Admission medications   Medication Sig Start Date End Date Taking? Authorizing Provider  acetaminophen  (TYLENOL ) 500 MG tablet Take 1,000 mg by mouth every 6 (six) hours as needed for moderate pain (pain score 4-6) or mild  pain (pain score 1-3).   Yes [provider]  albuterol  (VENTOLIN  HFA) 108 (90 Base) MCG/ACT inhaler Inhale 2 puffs into the lungs every 4 (four) hours as needed for wheezing or shortness of breath. 04/12/23  Yes [provider]  ALPRAZolam  (XANAX ) 0.5 MG tablet Take 0.25 mg by mouth 2 (two) times daily as needed for sleep or anxiety. 07/07/23  Yes [provider]  aspirin  EC 81 MG tablet Take 81 mg by mouth daily.   Yes [provider]  atorvastatin  (LIPITOR) 40 MG tablet Take 40 mg by mouth every evening.   Yes [provider]  azithromycin  (ZITHROMAX ) 500 MG tablet Take 500 mg by mouth every Monday, Wednesday, and Friday.   Yes [provider]  benzonatate (TESSALON) 100 MG capsule Take 100 mg by mouth 3 (three) times daily as needed for cough. 06/13/23  Yes [provider]  budesonide  (PULMICORT ) 0.5 MG/2ML nebulizer solution Take 0.5 mg by nebulization every 12 (twelve) hours.   Yes [provider]  carvedilol  (COREG ) 12.5 MG tablet Take 6.25 mg by mouth in the morning and at bedtime.   Yes [provider]  clopidogrel  (PLAVIX ) 75 MG tablet Take 75 mg by mouth in the morning.   Yes [provider]  famotidine  (PEPCID ) 20 MG tablet Take 20 mg by mouth 2 (two) times daily as needed  for heartburn. 06/01/23  Yes [provider]  folic acid  (FOLVITE ) 1 MG tablet Take 1 mg by mouth daily. 03/13/22  Yes [provider]  formoterol  (PERFOROMIST ) 20 MCG/2ML nebulizer solution Take 20 mcg by nebulization 2 (two) times daily.   Yes [provider]  ipratropium-albuterol  (DUONEB) 0.5-2.5 (3) MG/3ML SOLN Take 3 mLs by nebulization See admin instructions. Nebulize 3 ml's and inhale into the lungs every 5 hours   Yes [provider]  lisinopril  (ZESTRIL ) 5 MG tablet Take 5 mg by mouth every evening.   Yes [provider]  nitroGLYCERIN  (NITROSTAT ) 0.4 MG SL tablet Place 0.4 mg under the tongue every 5 (five) minutes as needed for chest pain. 04/23/23  Yes [provider]  OXYGEN Inhale 4 L/min into the lungs continuous.   Yes [provider]  predniSONE  (DELTASONE ) 5 MG tablet Take 1-2 tablets (5-10 mg total) by mouth See admin instructions. Take 5 mg by mouth on daily and then take 10 mg by mouth once daily the next day. Alternating doses. Patient taking differently: Take 5-10 mg by mouth See admin instructions. Take 5 mg by mouth with breakfast and ALTERNATE WITH the 10 mg strength EVERY OTHER MORNING 07/18/23  Yes Pokhrel, Laxman, MD  YUPELRI  175 MCG/3ML nebulizer solution Take 175 mcg by nebulization in the morning.   Yes [provider]  amoxicillin -clavulanate (AUGMENTIN ) 875-125 MG tablet Take 1 tablet by mouth 2 (two) times daily. Patient not taking: Reported on 10/14/2023 07/10/23   Pokhrel, Laxman, MD  predniSONE  (DELTASONE ) 10 MG tablet Take 4 tablets (40 mg) daily for 2 days, then, Take 3 tablets (30 mg) daily for 2 days, then, Take 2 tablets (20 mg) daily for 2 days, then, Take 1 tablets (10 mg) daily for 1 days, then stop Patient not taking: Reported on 10/14/2023 07/10/23   Sonjia Held, MD     Thank you for this interesting consult. I have spent 45 minutes evaluating patient, reviewing chart, and discussing  plan of care with patient, family, and primary medical team. If you have any questions or concerns please reach out  to me via pager ([phone]).  Paula Southerly, MD [facility]

## 2023-10-18 NOTE — Progress Notes (Signed)
 PROGRESS NOTE    Kristy Weeks  FMW:[id number] DOB: 07-21-1950 DOA: 10/13/2023 PCP: [name], PA-C   Brief Narrative: 73 year old with past medical history significant for chronic respiratory failure on 4 L of oxygen secondary to COPD, chronic cavitary lung lesion (MAC) and bronchiectasis, CAD status post PCI December 2023, hypertension, recent admission in July for respiratory failure in the setting of COVID, has been on prolonged antibiotics presents complaining of hemoptysis.  In the ED she required 6 L of oxygen but subsequently was able to transition down to 4 L.  CT show right segmental and subsegmental PE and demonstrated large cavitary lesion with chronic consolidation.   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  -CT angio chest:  Interval development of a right lower lobe segmental and subsegmental pulmonary embolus. Redemonstration of large cavitary right upper lobe lesion that is continuous with a chronic right lower lobe consolidation and surrounding satellite nodular airspace opacities in the right lower lobe and right middle lobes. No significant change. -Pulmonologist consulted due to hemoptysis/ Appreciate assistance. If patient hemoptysis worsen may consider IRR consultation for RUL bronchial artery embolization.  -ECHO: Normal RV function and LE doppler: negative.  -Hb has remain stable. 9/19: Required transiently higher amount of oxygen, but might have been probe was not working. Chest x ray stable. BNP not worsen, troponin flat.  9/20: Transition to   Eliquis  and monitor for 24--48 hours.   History of cavitary lung lesion: History of Mycobacterium Avium complex infection Hemoptysis.  -Resume Azithromycin .  -Appreciate pulmonologist assistance.  -Respiratory toilet (vest),  hypertonic saline,  -Tranexamic acid  inhaler.  Received for 3 days, 9/20. -still having hemoptysis. Plan to monitor for 24 hours after completion of tranexamic acid .   CAD status post PCI 2023 -Patient still on dual antiplatelet therapy.  -Cardiology consulted, in regards dual antiplatelet therapy tx for CAD, now that patient will required NOAC and  has Hemoptysis.   -Continue lipitor./  -Appreciate cardiology recommendations. Plan to hold aspirin  and plavix . Resume plavix  when able.   Chronic hypoxic respiratory failure, on 4 L of oxygen at home, COPD -Continue pulmicort , brovana , Incruse, schedule albuterol .  -Continue Guaifenesin .  -Resume Azithromycin .  -Advised she will need close follow up with Pulmonologist, could benefit from repeat bronchoscopy bx due to recent PE>  -On  prednisone  taper dose for COPD.    Hypertension: -Continue carvedilol / resume lisinopril , BP elevated   Hyponatremia; monitor. Improved.    Estimated body mass index is 18.5 kg/m as calculated from the following:   Height as of this encounter: 5' 4 (1.626 m).   Weight as of this encounter: 48.9 kg.   DVT prophylaxis: heparin  gtt Code Status: full code Family Communication: Care dicussed with patient.  Disposition Plan:  Status is: Inpatient Remains inpatient appropriate because: management of PE    Consultants:  Cardiology Pulmonologist   Procedures:  ECHO Doppler  Antimicrobials:    Subjective: She feels tired today, worn out.  Still having hemoptysis not worse.  Denies worsening dyspnea.   Objective: Vitals:   10/17/23 1600 10/17/23 1921 10/17/23 2035 10/18/23 0516  BP: 107/62  115/69 ROLLEN)  152/82  Pulse: 81  92 78  Resp: 20  19 (!) 21  Temp: 98 F  (36.7 C)  98.3 F (36.8 C) 97.6 F (36.4 C)  TempSrc: Oral   Oral  SpO2: 99% 97% 96% 100%  Weight:      Height:        Intake/Output Summary (Last 24 hours) at 10/18/2023 9277 Last data filed at 10/17/2023 2234 Gross per 24 hour  Intake 266.35 ml  Output --  Net 266.35 ml   Filed Weights   10/13/23 1931 10/14/23 0107  Weight: 49 kg 48.9 kg    Examination:  General exam: NAD Respiratory  system: BL [place].  Cardiovascular system: S 1, S 2 RRR Gastrointestinal system: BS present, soft, nt Central nervous system: BS present, soft nt Extremities: no edema    Data Reviewed: I have personally reviewed following labs and imaging studies  CBC: Recent Labs  Lab 10/14/23 0325 10/14/23 0356 10/14/23 0632 10/15/23 0320 10/16/23 0321 10/17/23 0304 10/18/23 0544  WBC 7.5 DUP PER JAMES PA @0434  TW 8.5 8.2 8.7 6.0 7.8  NEUTROABS 4.8 PENDING  --   --   --   --   --   HGB 11.3* DUP PER JAMES PA @0434  TW 11.7* 11.4* 12.0 10.4* 10.4*  HCT 38.9 DUP PER JAMES PA @0434  TW 39.6 37.3 38.6 33.6* 33.5*  MCV 101.0* DUP PER JAMES PA @0434  TW 102.9* 100.5* 97.2 98.8 99.1  PLT 204 DUP PER JAMES PA @0434  TW 201 178 180 193 185   Basic Metabolic Panel: Recent Labs  Lab 10/13/23 1958 10/14/23 0325 10/15/23 0320 10/16/23 0600 10/17/23 0304  NA 140 141 139 130* 134*  K 4.8 3.9 4.0 4.6 4.6  CL 98 97* 97* 90* 93*  CO2 33* 33* 33* 30 32  GLUCOSE 143* 125* 98 125* 124*  BUN 20 19 15 14 15   CREATININE 0.64 0.56 0.60 0.60 0.58  CALCIUM  9.8 9.5 9.4 9.6 9.8   GFR: Estimated Creatinine Clearance: 47.6 mL/min (by C-G formula based on SCr of 0.58 mg/dL). Liver Function Tests: Recent Labs  Lab 10/14/23 0447  AST 20  ALT 9  ALKPHOS 52  BILITOT 0.4  PROT 6.1*  ALBUMIN 3.9   No results for input(s): LIPASE, AMYLASE in the last 168 hours. No results for input(s): AMMONIA in the last 168 hours. Coagulation Profile: No results for input(s): INR, PROTIME in the last 168  hours. Cardiac Enzymes: No results for input(s): CKTOTAL, CKMB, CKMBINDEX, TROPONINI in the last 168 hours. BNP (last 3 results) Recent Labs    07/07/23 1942 10/14/23 0447 10/16/23 0832  PROBNP 655.0* 995.0* 588.0*   HbA1C: No results for input(s): HGBA1C in the last 72 hours. CBG: No results for input(s): GLUCAP in the last 168 hours. Lipid Profile: No results for input(s): CHOL, HDL, LDLCALC, TRIG, CHOLHDL, LDLDIRECT in the last 72 hours. Thyroid Function Tests: No results for input(s): TSH, T4TOTAL, FREET4, T3FREE, THYROIDAB in the last 72 hours. Anemia Panel: Recent Labs    10/16/23 0832 10/16/23 1237  VITAMINB12  --  548  FOLATE >20.0  --    Sepsis Labs: No results for input(s): PROCALCITON, LATICACIDVEN in the last 168 hours.  Recent Results (from the past 240 hours)  MRSA Next Gen by PCR, Nasal     Status: None   Collection Time: 10/14/23  1:02 AM   Specimen: Nasal Mucosa; Nasal Swab  Result Value Ref Range Status   MRSA by PCR Next Gen NOT DETECTED  NOT DETECTED Final    Comment: (NOTE) The GeneXpert MRSA Assay (FDA approved for NASAL specimens only), is one component of a comprehensive MRSA colonization surveillance program. It is not intended to diagnose MRSA infection nor to guide or monitor treatment for MRSA infections. Test performance is not FDA approved in patients less than 75 years old. Performed at [facility], [address], [place], [place] [address]          Radiology Studies: DG Abd 1 View Result Date: 10/16/2023 CLINICAL DATA:  Nausea. EXAM: ABDOMEN - 1 VIEW COMPARISON:  None Available. FINDINGS: No abnormal bowel dilatation is noted. Mild amount of stool seen throughout the colon. IMPRESSION: No abnormal bowel dilatation. Electronically Signed   By: Lynwood Landy Raddle M.D.   On: 10/16/2023 08:23   DG CHEST PORT 1 VIEW Result Date: 10/16/2023 CLINICAL DATA:  Acute hypoxic respiratory  failure. EXAM: PORTABLE CHEST 1 VIEW COMPARISON:  August 03, 2023.  October 13, 2023. FINDINGS: Stable cardiomediastinal silhouette. Stable cavitary abnormality seen in right upper lobe which may represent either cavitary malignancy or pneumonia. Emphysematous disease is noted. Mild right basilar atelectasis, scarring or inflammation is noted. Bony thorax is unremarkable. IMPRESSION: Stable cavitary abnormality seen in right upper lobe which may represent either cavitary malignancy or pneumonia. Mild right basilar atelectasis, scarring or inflammation is noted. Electronically Signed   By: Lynwood Landy Raddle M.D.   On: 10/16/2023 08:23        Scheduled Meds:  albuterol   2.5 mg Nebulization TID   ALPRAZolam   0.25-0.5 mg Oral QHS   apixaban   10 mg Oral BID   Followed by   NOREEN ON 10/24/2023] apixaban   5 mg Oral BID   arformoterol   15 mcg Nebulization BID   atorvastatin   40 mg Oral QHS   azithromycin   500 mg Oral Q M,W,F   budesonide   0.5 mg Nebulization BID   carvedilol   6.25 mg Oral BID WC   Chlorhexidine  Gluconate Cloth  6 each Topical Daily   feeding supplement  1 Container Oral BID BM   folic acid   1 mg Oral Daily   guaiFENesin   1,200 mg Oral BID   lisinopril   5 mg Oral Daily   pantoprazole  (PROTONIX ) IV  40 mg Intravenous Q12H   predniSONE   30 mg Oral Q breakfast   Followed by   NOREEN ON 10/20/2023] predniSONE   20 mg Oral Q breakfast   Followed by   NOREEN ON 10/22/2023] predniSONE   10 mg Oral Q breakfast   revefenacin   175 mcg Nebulization Daily   sodium chloride  HYPERTONIC  4 mL Nebulization BID   umeclidinium bromide   1 puff Inhalation Daily   Continuous Infusions:     LOS: 4 days    Time spent: 35 minutes    Christien Berthelot A Elize Pinon, MD [facility]   If 7PM-7AM, please contact night-coverage [email or web address]  10/18/2023, 7:22 AM

## 2023-10-19 ENCOUNTER — Other Ambulatory Visit (HOSPITAL_COMMUNITY): Payer: Self-pay

## 2023-10-19 ENCOUNTER — Telehealth (HOSPITAL_COMMUNITY): Payer: Self-pay | Admitting: Pharmacy Technician

## 2023-10-19 DIAGNOSIS — I2699 Other pulmonary embolism without acute cor pulmonale: Secondary | ICD-10-CM | POA: Diagnosis not present

## 2023-10-19 LAB — CBC
HCT: 32.4 % — ABNORMAL LOW (ref 36.0–46.0)
Hemoglobin: 9.9 g/dL — ABNORMAL LOW (ref 12.0–15.0)
MCH: 30.7 pg (ref 26.0–34.0)
MCHC: 30.6 g/dL (ref 30.0–36.0)
MCV: 100.6 fL — ABNORMAL HIGH (ref 80.0–100.0)
Platelets: 193 K/uL (ref 150–400)
RBC: 3.22 MIL/uL — ABNORMAL LOW (ref 3.87–5.11)
RDW: 12.5 % (ref 11.5–15.5)
WBC: 8.9 K/uL (ref 4.0–10.5)
nRBC: 0 % (ref 0.0–0.2)

## 2023-10-19 NOTE — Progress Notes (Signed)
 Patient ambulated with RN in hallway. Patient tolerated short distance of ambulation in the hallway to the window and back to room. Oxygen was increased to 6L [place] due to decreasing oxygen saturations 79-81%. On 6L oxygen saturations remained low 79-83%. Patient assisted back to room and placed back in bed call light bell in reach. Resp. Therapy in to give breathing treatment .

## 2023-10-19 NOTE — Progress Notes (Signed)
 NAME:  Kristy Weeks, MRN:  [id number], DOB:  [birth year], LOS: 5 ADMISSION DATE:  10/13/2023, CONSULTATION DATE:  10/18/2023 REFERRING MD:  MELODIE, CHIEF COMPLAINT:  hemoptysis   History of Present Illness:  Kristy Weeks is a 73 y/o F with a PMH significant for COPD c/b chronic hypoxic respiratory failure on 4 L O2 by [place], prior MAI RUL cavitary lesion, bronchiectasis, and severe CAD s/p PCI (12/2021) who presents for hemoptysis and found to have RLL segmental and subsegmental PE. Pulmonology consulted for evaluation and management of non-massive hemoptysis.   Pertinent  Medical History  As above  Significant Hospital Events: Including procedures, antibiotic start and stop dates in addition to other pertinent events   9/17: Admitted for PE and hemoptysis 9/18: Continuing to having hemoptysis, only slightly improved 9/19: hypoxic, hemoptysis improving, weaned down [place] from 12 to 4 L, started inhaled TXA and COPD exacerbation Tx 9/20: hemoptysis improved, started DOAC 9/22 slight improvement in volume and frequency of hemoptysis day to day  Interim History / Subjective:  Hemoptysis improved but still present  Objective    Blood pressure 118/63, pulse 66, temperature 97.7 F (36.5 C), temperature source Oral, resp. rate 18, height 5' 4 (1.626 m), weight 48.9 kg, SpO2 98%.        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 Echo without evidence of RV strain. - Continue  Eliquis    Mild hemoptysis Most likely to acute PE given onset of symptoms.  Likely worsened in the setting of CAD and antiplatelet agents.  Possible sequela of right upper lobe cavitary lesion but this seems less likely. --Status post TXA 9/19 - 9/20 --Monitor on Eliquis , degree of hemoptysis improving today per subjective report of patient, this is expected and likely will continue to improve over time, obviously the more time she is observed with mild improvement the more confident ongoing improvement will be sustained, recommend additional 24 hours of improvement and if so can likely d/c    COPD Exacerbation - Continue Prednisone  taper - LAMA LABA ICS - Duonebs QID  PCCM will sign off.  Labs   CBC: Recent Labs  Lab 10/14/23 0325 10/14/23 0356 10/14/23 0632 10/15/23 0320 10/16/23 0321 10/17/23 0304 10/18/23 0544 10/19/23 0446  WBC 7.5 DUP PER JAMES PA @0434  TW   < > 8.2 8.7 6.0 7.8 8.9  NEUTROABS 4.8 PENDING  --   --   --   --   --   --   HGB 11.3* DUP PER JAMES PA @0434  TW   < > 11.4* 12.0 10.4* 10.4* 9.9*  HCT 38.9 DUP PER JAMES PA @0434  TW   < > 37.3 38.6 33.6* 33.5* 32.4*  MCV 101.0* DUP PER JAMES PA @0434  TW   < > 100.5* 97.2 98.8 99.1 100.6*  PLT 204 DUP PER JAMES PA @0434  TW   < > 178 180 193 185 193   < > = values  in this interval not displayed.    Basic Metabolic Panel: Recent Labs  Lab 10/13/23 1958 10/14/23 0325 10/15/23 0320 10/16/23 0600 10/17/23 0304  NA 140 141 139 130* 134*  K 4.8 3.9 4.0 4.6 4.6  CL 98 97* 97* 90* 93*  CO2 33* 33* 33* 30 32  GLUCOSE 143* 125* 98 125* 124*  BUN 20 19 15 14 15   CREATININE 0.64 0.56 0.60 0.60 0.58  CALCIUM  9.8 9.5 9.4 9.6 9.8   GFR: Estimated Creatinine Clearance: 47.6 mL/min (by C-G formula based on SCr of 0.58 mg/dL). Recent Labs  Lab 10/16/23 0321 10/17/23 0304 10/18/23 0544 10/19/23 0446  WBC 8.7 6.0 7.8 8.9    Liver Function Tests: Recent Labs  Lab 10/14/23 0447  AST 20  ALT 9  ALKPHOS 52   BILITOT 0.4  PROT 6.1*  ALBUMIN 3.9   No results for input(s): LIPASE, AMYLASE in the last 168 hours. No results for input(s): AMMONIA in the last 168 hours.  ABG    Component Value Date/Time   HCO3 38.4 (H) 10/16/2023 0832   TCO2 38 (H) 07/07/2023 1956   O2SAT 85.9 10/16/2023 0832     Coagulation Profile: No results for input(s): INR, PROTIME in the last 168 hours.  Cardiac Enzymes: No results for input(s): CKTOTAL, CKMB, CKMBINDEX, TROPONINI in the last 168 hours.  HbA1C: Hgb A1c MFr Bld  Date/Time Value Ref Range Status  01/26/2018 11:21 AM 5.4 4.8 - 5.6 % Final    Comment:    (NOTE) Pre diabetes:          5.7%-6.4% Diabetes:              >6.4% Glycemic control for   <7.0% adults with diabetes     CBG: No results for input(s): GLUCAP in the last 168 hours.  Review of Systems:   Not obtained  Past Medical History:  She,  has a past medical history of COPD (chronic obstructive pulmonary disease) (HCC), Coronary artery disease, and Hypertension.   Surgical History:   Past Surgical History:  Procedure Laterality Date   CORONARY ANGIOPLASTY WITH STENT PLACEMENT       Social History:   reports that she quit smoking about 7 years ago. Her smoking use included cigarettes. She started smoking about 47 years ago. She has a 40 pack-year smoking history. She has never used smokeless tobacco. She reports that she does not drink alcohol and does not use drugs.   Family History:  Her family history includes CAD in her brother and father; Heart attack (age of onset: 27 - 73) in her father; Heart failure (age of onset: 59) in her father.   Allergies Allergies  Allergen Reactions   Cefdinir Other (See Comments)    Abdominal pain   Hydrocodone -Acetaminophen  Nausea Only     Home Medications  Prior to Admission medications   Medication Sig Start Date End Date Taking? Authorizing Provider  acetaminophen  (TYLENOL ) 500 MG tablet Take 1,000 mg by  mouth every 6 (six) hours as needed for moderate pain (pain score 4-6) or mild pain (pain score 1-3).   Yes [provider]  albuterol  (VENTOLIN  HFA) 108 (90 Base) MCG/ACT inhaler Inhale 2 puffs into the lungs every 4 (four) hours as needed for wheezing or shortness of breath. 04/12/23  Yes [provider]  ALPRAZolam  (XANAX ) 0.5 MG tablet Take 0.25 mg by mouth 2 (two) times daily as needed for sleep or anxiety. 07/07/23  Yes [provider]  aspirin  EC  81 MG tablet Take 81 mg by mouth daily.   Yes [provider]  atorvastatin  (LIPITOR) 40 MG tablet Take 40 mg by mouth every evening.   Yes [provider]  azithromycin  (ZITHROMAX ) 500 MG tablet Take 500 mg by mouth every Monday, Wednesday, and Friday.   Yes [provider]  benzonatate (TESSALON) 100 MG capsule Take 100 mg by mouth 3 (three) times daily as needed for cough. 06/13/23  Yes [provider]  budesonide  (PULMICORT ) 0.5 MG/2ML nebulizer solution Take 0.5 mg by nebulization every 12 (twelve) hours.   Yes [provider]  carvedilol  (COREG ) 12.5 MG tablet Take 6.25 mg by mouth in the morning and at bedtime.   Yes [provider]  clopidogrel  (PLAVIX ) 75 MG tablet Take 75 mg by mouth in the morning.   Yes [provider]  famotidine  (PEPCID ) 20 MG tablet Take 20 mg by mouth 2 (two) times daily as needed for heartburn. 06/01/23  Yes [provider]  folic acid  (FOLVITE ) 1 MG tablet Take 1 mg by mouth daily. 03/13/22  Yes [provider]  formoterol  (PERFOROMIST ) 20 MCG/2ML nebulizer solution Take 20 mcg by nebulization 2 (two) times daily.   Yes [provider]  ipratropium-albuterol  (DUONEB) 0.5-2.5 (3) MG/3ML SOLN Take 3 mLs by nebulization See admin instructions. Nebulize 3 ml's and inhale into the lungs every 5 hours   Yes [provider]  lisinopril  (ZESTRIL ) 5 MG tablet Take 5 mg by mouth every evening.   Yes [provider]  nitroGLYCERIN  (NITROSTAT ) 0.4 MG SL tablet Place 0.4 mg under the tongue every 5 (five) minutes as needed for chest pain. 04/23/23  Yes [provider]  OXYGEN Inhale 4 L/min into the lungs continuous.   Yes [provider]  predniSONE  (DELTASONE ) 5 MG tablet Take 1-2 tablets (5-10 mg total) by mouth See admin instructions. Take 5 mg by mouth on daily and then take 10 mg by mouth once daily the next day. Alternating doses. Patient taking differently: Take 5-10 mg by mouth See admin instructions. Take 5 mg by mouth with breakfast and ALTERNATE WITH the 10 mg strength EVERY OTHER MORNING 07/18/23  Yes Pokhrel, Laxman, MD  YUPELRI  175 MCG/3ML nebulizer solution Take 175 mcg by nebulization in the morning.   Yes [provider]  amoxicillin -clavulanate (AUGMENTIN ) 875-125 MG tablet Take 1 tablet by mouth 2 (two) times daily. Patient not taking: Reported on 10/14/2023 07/10/23   Pokhrel, Laxman, MD  predniSONE  (DELTASONE ) 10 MG tablet Take 4 tablets (40 mg) daily for 2 days, then, Take 3 tablets (30 mg) daily for 2 days, then, Take 2 tablets (20 mg) daily for 2 days, then, Take 1 tablets (10 mg) daily for 1 days, then stop Patient not taking: Reported on 10/14/2023 07/10/23   Sonjia Held, MD     Donnice JONELLE Beals, MD [facility]

## 2023-10-19 NOTE — Evaluation (Signed)
 Physical Therapy Evaluation Patient Details Name: Kristy Weeks MRN: [id number] DOB: April 09, 1950 Today's Date: 10/19/2023  History of Present Illness  Pt is a 73 y/o female presenting with hemoptysis. Found to have acute pulmonary embolism. Recent admission for COVID infection and COPD exacerbation July 2025. PMH: COPD on 4 L O2, HTN, CAD s/p PCI, chronic cavitary lung lesion and bronchiectasis  Clinical Impression  Pt admitted with above diagnosis.  Pt reports ind at her baseline, pt able to amb ~ 90' however reliant on bil HHA/support  (pt declined RW), SpO2=92% on 6L, returned to 4L at baseline with SpO2=89-93%. Pt will likely not need f/u post acute.  Pt currently with functional limitations due to the deficits listed below (see PT Problem List). Pt will benefit from acute skilled PT to increase their independence and safety with mobility to allow discharge.           If plan is discharge home, recommend the following: A little help with walking and/or transfers;Help with stairs or ramp for entrance   Can travel by private vehicle        Equipment Recommendations None recommended by PT  Recommendations for Other Services       Functional Status Assessment Patient has had a recent decline in their functional status and demonstrates the ability to make significant improvements in function in a reasonable and predictable amount of time.     Precautions / Restrictions Precautions Precautions: Fall;Other (comment) Precaution/Restrictions Comments: 4 L O2 at baseline (pt reports incr to 5-6L with incr activity)      Mobility  Bed Mobility Overal bed mobility: Modified Independent                  Transfers Overall transfer level: Needs assistance Equipment used: None, 1 person hand held assist Transfers: Sit to/from Stand Sit to Stand: Supervision           General transfer comment: for safety and line management    Ambulation/Gait Ambulation/Gait  assistance: Contact guard assist, Min assist Gait Distance (Feet): 90 Feet Assistive device: 2 person hand held assist Gait Pattern/deviations: Step-through pattern, Decreased stride length, Narrow base of support       General Gait Details: pt declines to use RW. assist to steady however no overt LOB, 1 therapeutic standing rest d/t dsypnea  ~ 1 minute, pt then able to continue--pt SpO2=92% on 6L  Stairs            Wheelchair Mobility     Tilt Bed    Modified Rankin (Stroke Patients Only)       Balance Overall balance assessment: Needs assistance Sitting-balance support: Feet supported, No upper extremity supported Sitting balance-Leahy Scale: Good     Standing balance support: Bilateral upper extremity supported, During functional activity Standing balance-Leahy Scale: Fair Standing balance comment: reliant on UE support for dynamic tasks                             Pertinent Vitals/Pain Pain Assessment Pain Assessment: No/denies pain    Home Living Family/patient expects to be discharged to:: Private residence Living Arrangements: Children (son) Available Help at Discharge: Family;Available PRN/intermittently   Home Access: Stairs to enter   Entrance Stairs-Number of Steps: 2-3   Home Layout: One level Home Equipment: Agricultural consultant (2 wheels);Cane - single point;Tub bench;Other (comment)      Prior Function Prior Level of Function : Independent/Modified Independent  Mobility Comments: no AD typically needed in the home ADLs Comments: Mod I for ADLs, able to assist with basic IADLs. Enjoys being with her dogs. Son lets dogs in/out of the yard     Extremity/Trunk Assessment   Upper Extremity Assessment Upper Extremity Assessment: Defer to OT evaluation    Lower Extremity Assessment Lower Extremity Assessment: Generalized weakness    Cervical / Trunk Assessment Cervical / Trunk Assessment: Other exceptions Cervical /  Trunk Exceptions: shoulder elevation  Communication   Communication Communication: No apparent difficulties    Cognition Arousal: Alert Behavior During Therapy: WFL for tasks assessed/performed   PT - Cognitive impairments: No apparent impairments                         Following commands: Intact       Cueing Cueing Techniques: Verbal cues     General Comments      Exercises     Assessment/Plan    PT Assessment Patient needs continued PT services  PT Problem List Decreased strength;Decreased activity tolerance;Decreased mobility;Decreased knowledge of use of DME;Decreased balance;Cardiopulmonary status limiting activity       PT Treatment Interventions DME instruction;Therapeutic activities;Gait training;Functional mobility training;Therapeutic exercise;Patient/family education    PT Goals (Current goals can be found in the Care Plan section)  Acute Rehab PT Goals PT Goal Formulation: With patient Time For Goal Achievement: 10/26/23 Potential to Achieve Goals: Good    Frequency Min 2X/week     Co-evaluation               AM-PAC PT 6 Clicks Mobility  Outcome Measure Help needed turning from your back to your side while in a flat bed without using bedrails?: A Little Help needed moving from lying on your back to sitting on the side of a flat bed without using bedrails?: A Little Help needed moving to and from a bed to a chair (including a wheelchair)?: A Little Help needed standing up from a chair using your arms (e.g., wheelchair or bedside chair)?: A Little Help needed to walk in hospital room?: A Little Help needed climbing 3-5 steps with a railing? : A Little 6 Click Score: 18    End of Session Equipment Utilized During Treatment: Gait belt Activity Tolerance: Patient tolerated treatment well Patient left: with call bell/phone within reach;with bed alarm set;in bed Nurse Communication: Mobility status PT Visit Diagnosis: Unsteadiness on  feet (R26.81)    Time: 8450-8398 PT Time Calculation (min) (ACUTE ONLY): 12 min   Charges:   PT Evaluation $PT Eval Low Complexity: 1 Low   PT General Charges $$ ACUTE PT VISIT: 1 Visit         Mylo Driskill, PT  Acute Rehab Dept [facility]) [phone]  10/19/2023   [facility] 10/19/2023, 4:46 PM

## 2023-10-19 NOTE — Telephone Encounter (Signed)
 Patient Product/process development scientist completed.    The patient is insured through [employer]. Patient has Medicare and is not eligible for a copay card, but may be able to apply for patient assistance or Medicare RX Payment Plan (Patient Must reach out to their plan, if eligible for payment plan), if available.    Ran test claim for Eliquis 5 mg and the current 30 day co-pay is $4.80.   This test claim was processed through [facility]- copay amounts may vary at other pharmacies due to pharmacy/plan contracts, or as the patient moves through the different stages of their insurance plan.     Roland Earl, CPHT Pharmacy Technician III Certified Patient Advocate [facility] Patient Advocate Team Direct Number: [phone]  Fax: [phone]

## 2023-10-19 NOTE — Evaluation (Addendum)
 Occupational Therapy Evaluation/Discharge Patient Details Name: Kristy Weeks MRN: [id number] DOB: 16-Jan-1951 Today's Date: 10/19/2023   History of Present Illness   Pt is a 73 y/o female presenting with hemoptysis. Found to have acute pulmonary embolism. Recent admission for COVID infection and COPD exacerbation July 2025. PMH: COPD on 4 L O2, HTN, CAD s/p PCI, chronic cavitary lung lesion and bronchiectasis     Clinical Impressions PTA, pt lives with son, typically Independent with ADLs, mobility and able to manage basic IADL tasks in the home. Pt presents now at baseline for ADLs and able to manage [facility] transfers/in-room mobility without assist or need for AD. Pt reports baseline DOE d/t COPD, wears 4 L O2 at home and reports still trying to recover from COVID infection in July. Discussed energy conservation w/ pt familiar with these strategies. Pt denies concerns managing at home, has needed DME and does not require additional OT services.      If plan is discharge home, recommend the following:   Assistance with cooking/housework     Functional Status Assessment   Patient has not had a recent decline in their functional status     Equipment Recommendations   None recommended by OT     Recommendations for Other Services         Precautions/Restrictions   Precautions Precautions: Fall;Other (comment) Precaution/Restrictions Comments: 4 L O2 at baseline Restrictions Weight Bearing Restrictions Per Provider Order: No     Mobility Bed Mobility Overal bed mobility: Modified Independent                  Transfers Overall transfer level: Independent Equipment used: None               General transfer comment: to/from [facility] without AD      Balance Overall balance assessment: No apparent balance deficits (not formally assessed)                                         ADL either performed or assessed with clinical judgement    ADL Overall ADL's : Modified independent                                       General ADL Comments: able to pivot on/off BSC, mobilize around bed in room without AD or without LOB. Discussed energy conservation with pt familiar with strategies     Vision Ability to See in Adequate Light: 0 Adequate Patient Visual Report: No change from baseline Vision Assessment?: No apparent visual deficits     Perception         Praxis         Pertinent Vitals/Pain Pain Assessment Pain Assessment: No/denies pain     Extremity/Trunk Assessment Upper Extremity Assessment Upper Extremity Assessment: Overall WFL for tasks assessed;Right hand dominant   Lower Extremity Assessment Lower Extremity Assessment: Defer to PT evaluation   Cervical / Trunk Assessment Cervical / Trunk Assessment: Normal   Communication Communication Communication: No apparent difficulties   Cognition Arousal: Alert Behavior During Therapy: WFL for tasks assessed/performed Cognition: No apparent impairments                               Following commands: Intact  Cueing  General Comments   Cueing Techniques: Verbal cues      Exercises     Shoulder Instructions      Home Living Family/patient expects to be discharged to:: Private residence Living Arrangements: Children (son) Available Help at Discharge: Family;Available PRN/intermittently Type of Home: House Home Access: Stairs to enter [employer] of Steps: 2-3   Home Layout: One level     Bathroom Shower/Tub: Chief Strategy Officer: Standard     Home Equipment: Agricultural consultant (2 wheels);Cane - single point;Tub bench;Other (comment) (pulse ox)          Prior Functioning/Environment Prior Level of Function : Independent/Modified Independent             Mobility Comments: no AD typically needed in the home ADLs Comments: Mod I for ADLs, able to assist with basic  IADLs. Enjoys being with her dogs. Son lets dogs in/out of the yard    OT Problem List: Cardiopulmonary status limiting activity   OT Treatment/Interventions:        OT Goals(Current goals can be found in the care plan section)   Acute Rehab OT Goals Patient Stated Goal: go home tomorrow, get back to my dogs OT Goal Formulation: All assessment and education complete, DC therapy   OT Frequency:       Co-evaluation              AM-PAC OT 6 Clicks Daily Activity     Outcome Measure Help from another person eating meals?: None Help from another person taking care of personal grooming?: None Help from another person toileting, which includes using toliet, bedpan, or urinal?: None Help from another person bathing (including washing, rinsing, drying)?: None Help from another person to put on and taking off regular upper body clothing?: None Help from another person to put on and taking off regular lower body clothing?: None 6 Click Score: 24   End of Session Equipment Utilized During Treatment: Oxygen  Activity Tolerance: Patient tolerated treatment well Patient left: in bed;with call bell/phone within reach  OT Visit Diagnosis: Other (comment) (decreased cardiopulmonary endurance)                Time: 9167-9151 OT Time Calculation (min): 16 min Charges:  OT General Charges $OT Visit: 1 Visit OT Evaluation $OT Eval Low Complexity: 1 Low  Mliss NOVAK, OTR/L Acute Rehab Services Office: [phone]   Mliss Fish 10/19/2023, 8:57 AM

## 2023-10-19 NOTE — Progress Notes (Signed)
 Patient's oxygen level now 90%. Resp Therapy turned O2 to 4l [place]. Continue to monitor Patient closley

## 2023-10-19 NOTE — TOC Initial Note (Signed)
 Transition of Care [facility]) - Initial/Assessment Note    Patient Details  Name: Kristy Weeks MRN: [id number] Date of Birth: 10-20-1950  Transition of Care [facility]) CM/SW Contact:    Bascom Service, RN Phone Number: 10/19/2023, 1:39 PM  Clinical Narrative:Spoke to patient about d/c plans-home. Used [facility] for HHPT in past-will use again if HHPT needed. Await PT cons & recc. Has home 02-[facility]-has travel tank. Has own transport own.                   Expected Discharge Plan: Home w Home Health Services Barriers to Discharge: Continued Medical Work up   Patient Goals and CMS Choice   CMS Medicare.gov Compare Post Acute Care list provided to:: Patient Choice offered to / list presented to : Patient [facility] ownership interest in [facility].provided to:: Patient    Expected Discharge Plan and Services   Discharge Planning Services: CM Consult Post Acute Care Choice: Home Health Living arrangements for the past 2 months: Single Family Home                                      Prior Living Arrangements/Services Living arrangements for the past 2 months: Single Family Home Lives with:: Spouse              Current home services: DME (Home 02-Active w/[facility])    Activities of Daily Living   ADL Screening (condition at time of admission) Independently performs ADLs?: No Does the patient have a NEW difficulty with bathing/dressing/toileting/self-feeding that is expected to last >3 days?: No Does the patient have a NEW difficulty with getting in/out of bed, walking, or climbing stairs that is expected to last >3 days?: No Does the patient have a NEW difficulty with communication that is expected to last >3 days?: No Is the patient deaf or have difficulty hearing?: Yes Does the patient have difficulty seeing, even when wearing glasses/contacts?: No Does the patient have difficulty concentrating, remembering, or making decisions?: No  Permission  Sought/Granted                  Emotional Assessment              Admission diagnosis:  Acute pulmonary embolism (HCC) [I26.99] Hemoptysis [R04.2] Other pulmonary embolism without acute cor pulmonale, unspecified chronicity (HCC) [I26.99] Pulmonary embolism (HCC) [I26.99] Patient Active Problem List   Diagnosis Date Noted   Pulmonary embolism (HCC) 10/14/2023   Hemoptysis 10/14/2023   Cavitary lesion of lung 10/14/2023   Acute pulmonary embolism (HCC) 10/13/2023   Bronchiectasis (HCC) 07/08/2023   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, [address] [place] [address] Phone: [phone] Fax: [phone]     Social Drivers of Health (SDOH) Social History: SDOH Screenings   Food Insecurity: No Food Insecurity (10/14/2023)  Housing: Low Risk  (10/14/2023)  Transportation Needs: No Transportation Needs (10/14/2023)  Utilities: Not At Risk (10/14/2023)  Social Connections: Socially Isolated (10/14/2023)  Tobacco Use:  Medium Risk (10/14/2023)   SDOH Interventions:     Readmission Risk Interventions    07/08/2023    2:02 PM 02/19/2023    1:48 PM  Readmission Risk Prevention Plan  Post Dischage Appt  Complete  Medication Screening  Complete  Transportation Screening Complete Complete  PCP or Specialist Appt within 5-7 Days Complete   Home Care Screening Complete   Medication Review (RN CM) Complete

## 2023-10-19 NOTE — Progress Notes (Signed)
 PROGRESS NOTE    Kristy Weeks  FMW:[id number] DOB: 08-Sep-1950 DOA: 10/13/2023 PCP: [name], PA-C   Brief Narrative: 73 year old with past medical history significant for chronic respiratory failure on 4 L of oxygen secondary to COPD, chronic cavitary lung lesion (MAC) and bronchiectasis, CAD status post PCI December 2023, hypertension, recent admission in July for respiratory failure in the setting of COVID, has been on prolonged antibiotics presents complaining of hemoptysis.  In the ED she required 6 L of oxygen but subsequently was able to transition down to 4 L.  CT show right segmental and subsegmental PE and demonstrated large cavitary lesion with chronic consolidation.   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  -CT angio chest:  Interval development of a right lower lobe segmental and subsegmental pulmonary embolus. Redemonstration of large cavitary right upper lobe lesion that is continuous with a chronic right lower lobe consolidation and surrounding satellite nodular airspace opacities in the right lower lobe and right middle lobes. No significant change. -Pulmonologist consulted due to hemoptysis/ Appreciate assistance. If patient hemoptysis worsen may consider IRR consultation for RUL bronchial artery embolization.  -ECHO: Normal RV function and LE doppler: negative.  -Hb has remain stable. 9/19: Required transiently higher amount of oxygen, but might have been probe was not working. Chest x ray stable. BNP not worsen, troponin flat.  9/20: Transition to   Eliquis  and monitor for 24--48 hours.   History of cavitary lung lesion: History of Mycobacterium Avium complex infection Hemoptysis.  -Resume Azithromycin .  -Appreciate pulmonologist assistance.  -Respiratory toilet (vest),  hypertonic saline,  -Tranexamic acid  inhaler.  Received for 3 days, 9/20. Report improvement of hemoptysis. Plan to monitor for another 24 hours.   CAD status post PCI 2023 -Patient still on dual antiplatelet therapy.  -Cardiology consulted, in regards dual antiplatelet therapy tx for CAD, now that patient will required NOAC and  has Hemoptysis.   -Continue lipitor./  -Appreciate cardiology recommendations. Plan to hold aspirin  and plavix . Resume plavix  when able.   Chronic hypoxic respiratory failure, on 4 L of oxygen at home, COPD -Continue pulmicort , brovana , Incruse, schedule albuterol .  -Continue Guaifenesin .  -Resume Azithromycin .  -Advised she will need close follow up with Pulmonologist, could benefit from repeat bronchoscopy bx due to recent PE>  -On  prednisone  taper dose for COPD.    Hypertension: -Continue carvedilol / resume lisinopril , BP elevated   Hyponatremia; monitor. Improved.    Estimated body mass index is 18.5 kg/m as calculated from the following:   Height as of this encounter: 5' 4 (1.626 m).   Weight as of this encounter: 48.9 kg.   DVT prophylaxis: heparin  gtt Code Status: full code Family Communication: Care dicussed with patient.  Disposition Plan:  Status is: Inpatient Remains inpatient appropriate because: management of PE    Consultants:  Cardiology Pulmonologist   Procedures:  ECHO Doppler  Antimicrobials:    Subjective: Yesterday she transiently required 6 L oxygen. She was ambulating when she desat.  She was able to titrate down to 4 L.  She is feeling ok, has not been abel to rest, or sleep well. Denies  worsening dyspnea.  Report some improvement of hemoptysis.    Objective: Vitals:   10/19/23  0736 10/19/23 0737 10/19/23 1151 10/19/23 1352  BP:    102/81  Pulse:    72  Resp:    20  Temp:    98.1 F (36.7 C)  TempSrc:    Oral  SpO2: 98% 98% 97% 94%  Weight:      Height:        Intake/Output Summary (Last 24 hours) at 10/19/2023 1352 Last data filed at 10/19/2023 0700 Gross per 24 hour  Intake 358 ml  Output --  Net 358 ml   Filed Weights   10/13/23 1931 10/14/23 0107  Weight: 49 kg 48.9 kg    Examination:  General exam: NAD Respiratory  system: BL ronchus.  Cardiovascular system: S 1, S 2 RRR Gastrointestinal system: BS present, soft, nt Central nervous system: alert Extremities: no edema    Data Reviewed: I have personally reviewed following labs and imaging studies  CBC: Recent Labs  Lab 10/14/23 0325 10/14/23 0356 10/14/23 0632 10/15/23 0320 10/16/23 0321 10/17/23 0304 10/18/23 0544 10/19/23 0446  WBC 7.5 DUP PER JAMES PA @0434  TW   < > 8.2 8.7 6.0 7.8 8.9  NEUTROABS 4.8 PENDING  --   --   --   --   --   --   HGB 11.3* DUP PER JAMES PA @0434  TW   < > 11.4* 12.0 10.4* 10.4* 9.9*  HCT 38.9 DUP PER JAMES PA @0434  TW   < > 37.3 38.6 33.6* 33.5* 32.4*  MCV 101.0* DUP PER JAMES PA @0434  TW   < > 100.5* 97.2 98.8 99.1 100.6*  PLT 204 DUP PER JAMES PA @0434  TW   < > 178 180 193 185 193   < > = values in this interval not displayed.   Basic Metabolic Panel: Recent Labs  Lab 10/13/23 1958 10/14/23 0325 10/15/23 0320 10/16/23 0600 10/17/23 0304  NA 140 141 139 130* 134*  K 4.8 3.9 4.0 4.6 4.6  CL 98 97* 97* 90* 93*  CO2 33* 33* 33* 30 32  GLUCOSE 143* 125* 98 125* 124*  BUN 20 19 15 14 15   CREATININE 0.64 0.56 0.60 0.60 0.58  CALCIUM  9.8 9.5 9.4 9.6 9.8   GFR: Estimated Creatinine Clearance: 47.6 mL/min (by C-G formula based on SCr of 0.58 mg/dL). Liver Function Tests: Recent Labs  Lab 10/14/23 0447  AST 20  ALT 9  ALKPHOS 52  BILITOT 0.4  PROT 6.1*  ALBUMIN 3.9   No results for input(s): LIPASE, AMYLASE in the last 168  hours. No results for input(s): AMMONIA in the last 168 hours. Coagulation Profile: No results for input(s): INR, PROTIME in the last 168 hours. Cardiac Enzymes: No results for input(s): CKTOTAL, CKMB, CKMBINDEX, TROPONINI in the last 168 hours. BNP (last 3 results) Recent Labs    07/07/23 1942 10/14/23 0447 10/16/23 0832  PROBNP 655.0* 995.0* 588.0*   HbA1C: No results for input(s): HGBA1C in the last 72 hours. CBG: No results for input(s): GLUCAP in the last 168 hours. Lipid Profile: No results for input(s): CHOL, HDL, LDLCALC, TRIG, CHOLHDL, LDLDIRECT in the last 72 hours. Thyroid Function Tests: No results for input(s): TSH, T4TOTAL, FREET4, T3FREE, THYROIDAB in the last 72 hours. Anemia Panel: No results for input(s): VITAMINB12, FOLATE, FERRITIN, TIBC, IRON, RETICCTPCT in the last 72 hours.  Sepsis Labs: No results for input(s): PROCALCITON, LATICACIDVEN in the last 168 hours.  Recent Results (from  the past 240 hours)  MRSA Next Gen by PCR, Nasal     Status: None   Collection Time: 10/14/23  1:02 AM   Specimen: Nasal Mucosa; Nasal Swab  Result Value Ref Range Status   MRSA by PCR Next Gen NOT DETECTED NOT DETECTED Final    Comment: (NOTE) The GeneXpert MRSA Assay (FDA approved for NASAL specimens only), is one component of a comprehensive MRSA colonization surveillance program. It is not intended to diagnose MRSA infection nor to guide or monitor treatment for MRSA infections. Test performance is not FDA approved in patients less than 36 years old. Performed at [facility], [address], [place], [place] [address]          Radiology Studies: DG CHEST PORT 1 VIEW Result Date: 10/18/2023 CLINICAL DATA:  Dyspnea, increasing shortness of breath with exertion. EXAM: PORTABLE CHEST 1 VIEW COMPARISON:  10/15/2023, 10/13/2023. FINDINGS: The heart size and mediastinal contours are stable.  Atherosclerotic calcification of the aorta is noted. Emphysematous changes are noted in the lungs. There is stable cavitation with atelectasis or pneumonia in the mid to upper right lung. Stable airspace disease is present at the right lung base. There is chronic blunting of the right costophrenic angle, likely scarring. No pneumothorax is seen. No acute osseous abnormality. IMPRESSION: 1. Stable known cavitary region in the right lung with associated atelectasis or infiltrate. Follow-up is recommended until resolution. 2. Emphysema. Electronically Signed   By: Leita Birmingham M.D.   On: 10/18/2023 17:08        Scheduled Meds:  albuterol   2.5 mg Nebulization TID   ALPRAZolam   0.25-0.5 mg Oral QHS   apixaban   10 mg Oral BID   Followed by   NOREEN ON 10/24/2023] apixaban   5 mg Oral BID   arformoterol   15 mcg Nebulization BID   atorvastatin   40 mg Oral QHS   azithromycin   500 mg Oral Q M,W,F   budesonide   0.5 mg Nebulization BID   carvedilol   6.25 mg Oral BID WC   Chlorhexidine  Gluconate Cloth  6 each Topical Daily   feeding supplement  1 Container Oral BID BM   folic acid   1 mg Oral Daily   guaiFENesin   1,200 mg Oral BID   lisinopril   5 mg Oral Daily   pantoprazole  (PROTONIX ) IV  40 mg Intravenous Q12H   polyethylene glycol  17 g Oral Daily   [START ON 10/20/2023] predniSONE   20 mg Oral Q breakfast   Followed by   NOREEN ON 10/22/2023] predniSONE   10 mg Oral Q breakfast   revefenacin   175 mcg Nebulization Daily   senna-docusate  1 tablet Oral BID   Continuous Infusions:     LOS: 5 days    Time spent: 35 minutes    Rejeana Fadness [place], MD [facility]   If 7PM-7AM, please contact night-coverage [email or web address]  10/19/2023, 1:52 PM

## 2023-10-20 DIAGNOSIS — I2699 Other pulmonary embolism without acute cor pulmonale: Secondary | ICD-10-CM | POA: Diagnosis not present

## 2023-10-20 MED ORDER — GUAIFENESIN ER 600 MG PO TB12: 1200.0000 mg | ORAL_TABLET | Freq: Two times a day (BID) | ORAL | 0 refills | Status: AC

## 2023-10-20 MED ORDER — PREDNISONE 10 MG PO TABS: ORAL_TABLET | ORAL | 0 refills | Status: AC

## 2023-10-20 MED ORDER — POLYETHYLENE GLYCOL 3350 17 G PO PACK: 17.0000 g | PACK | Freq: Every day | ORAL | 0 refills | Status: AC

## 2023-10-20 MED ORDER — PANTOPRAZOLE SODIUM 40 MG PO TBEC: 40.0000 mg | DELAYED_RELEASE_TABLET | Freq: Every day | ORAL | 1 refills | Status: AC

## 2023-10-20 MED ORDER — CLOPIDOGREL BISULFATE 75 MG PO TABS
75.0000 mg | ORAL_TABLET | Freq: Every day | ORAL | Status: DC
  Administered 2023-10-20 – 2023-10-21 (×2): 75 mg via ORAL
  Filled 2023-10-20 (×2): qty 1

## 2023-10-20 MED ORDER — CARVEDILOL 6.25 MG PO TABS: 6.2500 mg | ORAL_TABLET | Freq: Two times a day (BID) | ORAL | 0 refills | Status: AC

## 2023-10-20 MED ORDER — APIXABAN 5 MG PO TABS: ORAL_TABLET | ORAL | 0 refills | Status: AC

## 2023-10-20 NOTE — Progress Notes (Signed)
   10/20/23 1443  Spiritual Encounters  Type of Visit Follow up  Care provided to: Patient  Conversation partners present during encounter Nurse  Referral source Patient request  Reason for visit Advance directives  OnCall Visit No   Responded to spiritual consult for advance directive. Patient says son completed paperwork for the [facility] and will bring it to hospital around 4:30. Advised pt that son doesn't have to be present and can therefore leave paperwork to be processed if services are not available at 4:30 pm. Will alert oncoming chaplain and morning chaplain.

## 2023-10-20 NOTE — Progress Notes (Signed)
   10/20/23 1249  Spiritual Encounters  Type of Visit Attempt (pt unavailable)  Reason for visit Advance directives  OnCall Visit No   Attempted to responded to concult request. Patient not available at this time.

## 2023-10-20 NOTE — Progress Notes (Signed)
 NAME:  Kristy Weeks, MRN:  [id number], DOB:  06-01-1950, LOS: 6 ADMISSION DATE:  10/13/2023, CONSULTATION DATE:  10/18/2023 REFERRING MD:  MELODIE, CHIEF COMPLAINT:  hemoptysis   History of Present Illness:  Kristy Weeks is a 73 y/o F with a PMH significant for COPD c/b chronic hypoxic respiratory failure on 4 L O2 by [facility]er, prior MAI RUL cavitary lesion, bronchiectasis, and severe CAD s/p PCI (12/2021) who presents for hemoptysis and found to have RLL segmental and subsegmental PE. Pulmonology consulted for evaluation and management of non-massive hemoptysis.   Pertinent  Medical History  As above  Significant Hospital Events: Including procedures, antibiotic start and stop dates in addition to other pertinent events   9/17: Admitted for PE and hemoptysis 9/18: Continuing to having hemoptysis, only slightly improved 9/19: hypoxic, hemoptysis improving, weaned down Garrett from 12 to 4 L, started inhaled TXA and COPD exacerbation Tx 9/20: hemoptysis improved, started DOAC 9/22 slight improvement in volume and frequency of hemoptysis day to day  Interim History / Subjective:  Hemoptysis improved but still present, she feels at baseline and is eager to go home  Objective    Blood pressure 104/67, pulse 76, temperature 98.2 F (36.8 C), temperature source Oral, resp. rate 20, height 5' 4 (1.626 m), weight 48.9 kg, SpO2 96%.        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  Echo without evidence of RV strain. - Continue Eliquis    Mild hemoptysis Most likely to acute PE given onset of symptoms.  Likely worsened in the setting of CAD and antiplatelet agents.  Possible sequela of right upper lobe cavitary lesion but this seems less likely. --Status post TXA 9/19 - 9/20 --Monitor on Eliquis , degree of hemoptysis improving today per subjective report of patient --Resume Plavix  for CAD    COPD Exacerbation - Continue Prednisone  taper - LAMA LABA ICS - Duonebs QID  PCCM will sign off.  Labs   CBC: Recent Labs  Lab 10/14/23 0325 10/14/23 0356 10/14/23 0632 10/15/23 0320 10/16/23 0321 10/17/23 0304 10/18/23 0544 10/19/23 0446  WBC 7.5 DUP PER JAMES PA @0434  TW   < > 8.2 8.7 6.0 7.8 8.9  NEUTROABS 4.8 PENDING  --   --   --   --   --   --   HGB 11.3* DUP PER JAMES PA @0434  TW   < > 11.4* 12.0 10.4* 10.4* 9.9*  HCT 38.9 DUP PER JAMES PA @0434  TW   < > 37.3 38.6 33.6* 33.5* 32.4*  MCV 101.0* DUP PER JAMES PA @0434  TW   < > 100.5* 97.2 98.8 99.1 100.6*  PLT 204 DUP PER JAMES PA @0434  TW   < > 178 180 193 185 193   < > = values in this interval not displayed.    Basic Metabolic Panel: Recent Labs  Lab 10/13/23 1958 10/14/23 0325 10/15/23 0320 10/16/23 0600 10/17/23 0304  NA  140 141 139 130* 134*  K 4.8 3.9 4.0 4.6 4.6  CL 98 97* 97* 90* 93*  CO2 33* 33* 33* 30 32  GLUCOSE 143* 125* 98 125* 124*  BUN 20 19 15 14 15   CREATININE 0.64 0.56 0.60 0.60 0.58  CALCIUM  9.8 9.5 9.4 9.6 9.8   GFR: Estimated Creatinine Clearance: 47.6 mL/min (by C-G formula based on SCr of 0.58 mg/dL). Recent Labs  Lab 10/16/23 0321 10/17/23 0304 10/18/23 0544 10/19/23 0446  WBC 8.7 6.0 7.8 8.9    Liver Function Tests: Recent Labs  Lab 10/14/23 0447  AST 20  ALT 9  ALKPHOS 52  BILITOT 0.4  PROT 6.1*  ALBUMIN 3.9   No results for input(s): LIPASE, AMYLASE in the last 168 hours. No results for input(s): AMMONIA in the last 168 hours.  ABG     Component Value Date/Time   HCO3 38.4 (H) 10/16/2023 0832   TCO2 38 (H) 07/07/2023 1956   O2SAT 85.9 10/16/2023 0832     Coagulation Profile: No results for input(s): INR, PROTIME in the last 168 hours.  Cardiac Enzymes: No results for input(s): CKTOTAL, CKMB, CKMBINDEX, TROPONINI in the last 168 hours.  HbA1C: Hgb A1c MFr Bld  Date/Time Value Ref Range Status  01/26/2018 11:21 AM 5.4 4.8 - 5.6 % Final    Comment:    (NOTE) Pre diabetes:          5.7%-6.4% Diabetes:              >6.4% Glycemic control for   <7.0% adults with diabetes     CBG: No results for input(s): GLUCAP in the last 168 hours.  Review of Systems:   Not obtained  Past Medical History:  She,  has a past medical history of COPD (chronic obstructive pulmonary disease) (HCC), Coronary artery disease, and Hypertension.   Surgical History:   Past Surgical History:  Procedure Laterality Date   CORONARY ANGIOPLASTY WITH STENT PLACEMENT       Social History:   reports that she quit smoking about 7 years ago. Her smoking use included cigarettes. She started smoking about 47 years ago. She has a 40 pack-year smoking history. She has never used smokeless tobacco. She reports that she does not drink alcohol and does not use drugs.   Family History:  Her family history includes CAD in her brother and father; Heart attack (age of onset: 29 - 85) in her father; Heart failure (age of onset: 44) in her father.   Allergies Allergies  Allergen Reactions   Cefdinir Other (See Comments)    Abdominal pain   Hydrocodone -Acetaminophen  Nausea Only     Home Medications  Prior to Admission medications   Medication Sig Start Date End Date Taking? Authorizing Provider  acetaminophen  (TYLENOL ) 500 MG tablet Take 1,000 mg by mouth every 6 (six) hours as needed for moderate pain (pain score 4-6) or mild pain (pain score 1-3).   Yes [provider]  albuterol  (VENTOLIN  HFA) 108 (90 Base) MCG/ACT  inhaler Inhale 2 puffs into the lungs every 4 (four) hours as needed for wheezing or shortness of breath. 04/12/23  Yes [provider]  ALPRAZolam  (XANAX ) 0.5 MG tablet Take 0.25 mg by mouth 2 (two) times daily as needed for sleep or anxiety. 07/07/23  Yes [provider]  aspirin  EC 81 MG tablet Take 81 mg by mouth daily.   Yes [provider]  atorvastatin  (LIPITOR) 40 MG tablet Take 40 mg by mouth every  evening.   Yes [provider]  azithromycin  (ZITHROMAX ) 500 MG tablet Take 500 mg by mouth every Monday, Wednesday, and Friday.   Yes [provider]  benzonatate (TESSALON) 100 MG capsule Take 100 mg by mouth 3 (three) times daily as needed for cough. 06/13/23  Yes [provider]  budesonide  (PULMICORT ) 0.5 MG/2ML nebulizer solution Take 0.5 mg by nebulization every 12 (twelve) hours.   Yes [provider]  carvedilol  (COREG ) 12.5 MG tablet Take 6.25 mg by mouth in the morning and at bedtime.   Yes [provider]  clopidogrel  (PLAVIX ) 75 MG tablet Take 75 mg by mouth in the morning.   Yes [provider]  famotidine  (PEPCID ) 20 MG tablet Take 20 mg by mouth 2 (two) times daily as needed for heartburn. 06/01/23  Yes [provider]  folic acid  (FOLVITE ) 1 MG tablet Take 1 mg by mouth daily. 03/13/22  Yes [provider]  formoterol  (PERFOROMIST ) 20 MCG/2ML nebulizer solution Take 20 mcg by nebulization 2 (two) times daily.   Yes [provider]  ipratropium-albuterol  (DUONEB) 0.5-2.5 (3) MG/3ML SOLN Take 3 mLs by nebulization See admin instructions. Nebulize 3 ml's and inhale into the lungs every 5 hours   Yes [provider]  lisinopril  (ZESTRIL ) 5 MG tablet Take 5 mg by mouth every evening.   Yes [provider]  nitroGLYCERIN  (NITROSTAT ) 0.4 MG SL tablet Place 0.4 mg under the tongue every 5 (five) minutes as needed for chest pain. 04/23/23  Yes [provider]   OXYGEN Inhale 4 L/min into the lungs continuous.   Yes [provider]  predniSONE  (DELTASONE ) 5 MG tablet Take 1-2 tablets (5-10 mg total) by mouth See admin instructions. Take 5 mg by mouth on daily and then take 10 mg by mouth once daily the next day. Alternating doses. Patient taking differently: Take 5-10 mg by mouth See admin instructions. Take 5 mg by mouth with breakfast and ALTERNATE WITH the 10 mg strength EVERY OTHER MORNING 07/18/23  Yes Pokhrel, Laxman, MD  YUPELRI  175 MCG/3ML nebulizer solution Take 175 mcg by nebulization in the morning.   Yes [provider]  amoxicillin -clavulanate (AUGMENTIN ) 875-125 MG tablet Take 1 tablet by mouth 2 (two) times daily. Patient not taking: Reported on 10/14/2023 07/10/23   Pokhrel, Laxman, MD  predniSONE  (DELTASONE ) 10 MG tablet Take 4 tablets (40 mg) daily for 2 days, then, Take 3 tablets (30 mg) daily for 2 days, then, Take 2 tablets (20 mg) daily for 2 days, then, Take 1 tablets (10 mg) daily for 1 days, then stop Patient not taking: Reported on 10/14/2023 07/10/23   Sonjia Held, MD     Donnice JONELLE Beals, MD [facility]

## 2023-10-20 NOTE — Progress Notes (Signed)
 Mobility Specialist - Progress Note   Post-mobility: 90% SPO2 - 4 L O2    10/20/23 1104  Mobility  Activity Ambulated with assistance  Level of Assistance Contact guard assist, steadying assist  Assistive Device None  Distance Ambulated (ft) 50 ft  Range of Motion/Exercises Active Assistive  Activity Response Tolerated fair  Mobility Referral Yes  Mobility visit 1 Mobility  Mobility Specialist Start Time (ACUTE ONLY) 1025  Mobility Specialist Stop Time (ACUTE ONLY) 1040  Mobility Specialist Time Calculation (min) (ACUTE ONLY) 15 min    Pt was received in bed and agreed to mobility. HHA during ambulation. Felt lightheaded towards EOS and had x1 LOB. Upon returning to room, SpO2 checked to be 88%, increased to 90% after 1 min. Returned to bed with all needs met. Call bell in reach.  [employer] - Mobility Specialist

## 2023-10-20 NOTE — TOC Transition Note (Signed)
 Transition of Care [facility]) - Discharge Note   Patient Details  Name: Kristy Weeks MRN: [id number] Date of Birth: 1950-06-19  Transition of Care [facility]) CM/SW Contact:  Bascom Service, RN Phone Number: 10/20/2023, 11:46 AM   Clinical Narrative: d/c home.PT no f/u. Already has home 02 w/[employer]-has travel tank. Has own transport home. No further CM needs.      Final next level of care: Home/Self Care Barriers to Discharge: No Barriers Identified   Patient Goals and CMS Choice Patient states their goals for this hospitalization and ongoing recovery are:: Home CMS Medicare.gov Compare Post Acute Care list provided to:: Patient Choice offered to / list presented to : Patient [place] ownership interest in [facility].provided to:: Patient    Discharge Placement                       Discharge Plan and Services Additional resources added to the After Visit Summary for     Discharge Planning Services: CM Consult Post Acute Care Choice: Home Health                               Social Drivers of Health (SDOH) Interventions SDOH Screenings   Food Insecurity: No Food Insecurity (10/14/2023)  Housing: Low Risk  (10/14/2023)  Transportation Needs: No Transportation Needs (10/14/2023)  Utilities: Not At Risk (10/14/2023)  Social Connections: Socially Isolated (10/14/2023)  Tobacco Use: Medium Risk (10/14/2023)     Readmission Risk Interventions    10/19/2023    1:41 PM 07/08/2023    2:02 PM 02/19/2023    1:48 PM  Readmission Risk Prevention Plan  Post Dischage Appt   Complete  Medication Screening   Complete  Transportation Screening Complete Complete Complete  PCP or Specialist Appt within 5-7 Days  Complete   PCP or Specialist Appt within 3-5 Days Complete    Home Care Screening  Complete   Medication Review (RN CM)  Complete   HRI or Home Care Consult Complete    Social Work Consult for Recovery Care Planning/Counseling Complete    Palliative  Care Screening Not Applicable    Medication Review Oceanographer) Complete

## 2023-10-20 NOTE — Progress Notes (Signed)
 PROGRESS NOTE    Kristy Weeks  FMW:[id number] DOB: 05-13-1950 DOA: 10/13/2023 PCP: [name], PA-C   Brief Narrative: 73 year old with past medical history significant for chronic respiratory failure on 4 L of oxygen secondary to COPD, chronic cavitary lung lesion (MAC) and bronchiectasis, CAD status post PCI December 2023, hypertension, recent admission in July for respiratory failure in the setting of COVID, has been on prolonged antibiotics presents complaining of hemoptysis.  In the ED she required 6 L of oxygen but subsequently was able to transition down to 4 L.  CT show right segmental and subsegmental PE and demonstrated large cavitary lesion with chronic consolidation.  Patient was initially treated with IV heparin , subsequently transition to Eliquis  when she was not having significant hemoptysis.  Aspirin  and Plavix  were discontinued due to hemoptysis.  After discussion with cardiology and pulmonologist Plavix  was resumed on 9/23.  Patient was going to be discharged home today but she started to cough more blood.  Plan to monitor overnight.  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  -CT angio chest:  Interval development of a right lower lobe segmental and subsegmental pulmonary embolus. Redemonstration of large cavitary right upper lobe lesion that is continuous with a chronic right lower lobe consolidation and surrounding satellite nodular airspace opacities in the right lower lobe and right middle lobes. No significant change. -Pulmonologist consulted due to  hemoptysis/ Appreciate assistance. If patient hemoptysis worsen may consider IR consultation for RUL bronchial artery embolization.  -ECHO: Normal RV function and LE doppler: negative.  -Hb has remain stable. -9/19: Required transiently higher amount of oxygen, but might have been probe was not working. Chest x ray stable. BNP not worsen, troponin flat.  -9/20: Transition to  Eliquis . -923; resume Plavix  after discussion with pulmonologist and cardiologist. She reporter more amount of hemoptysis, will keep overnight, if hemoptysis worsen please hold plavix   History of cavitary lung lesion: History of Mycobacterium Avium complex infection Hemoptysis.  -Continue with Azithromycin .  -Appreciate pulmonologist assistance.  -Respiratory toilet (vest),  hypertonic saline. -Tranexamic acid  inhaler.  Received for 3 days, last dose 9/20. - Patient has remained in the hospital for a prolonged hospital stay due to hemoptysis.   CAD status post PCI 2023 -Patient still on dual antiplatelet therapy.  -Cardiology consulted, in regards dual antiplatelet therapy tx for CAD, now that patient will required NOAC and  has Hemoptysis.   -Continue lipitor./  -Appreciate cardiology recommendations. Plan to hold aspirin  and plavix .  -per cardiology and pulmonologist ok to resume plavix  9/23.  Chronic hypoxic respiratory failure, on 4 L of oxygen at home, COPD -Continue pulmicort , brovana , Incruse, schedule albuterol .  -Continue Guaifenesin .  -Resume Azithromycin .  -Advised she will need close follow up with Pulmonologist, could benefit from repeat bronchoscopy bx due to recent PE>  -On  prednisone  taper dose for COPD.    Hypertension: -Continue carvedilol / resume lisinopril , BP elevated   Hyponatremia; monitor. Improved.    Estimated body mass index is 18.5 kg/m as calculated from the following:   Height as of this encounter: 5' 4 (1.626 m).   Weight as of this encounter:  48.9 kg.   DVT  prophylaxis: heparin  gtt Code Status: full code Family Communication: Care dicussed with patient.  Disposition Plan:  Status is: Inpatient Remains inpatient appropriate because: management of PE    Consultants:  Cardiology Pulmonologist   Procedures:  ECHO Doppler  Antimicrobials:    Subjective: -She reports slow improvement.  Denies worsening shortness of breath.  After she took Plavix  she developed more amount of hemoptysis   Objective: Vitals:   10/20/23 0903 10/20/23 1001 10/20/23 1317 10/20/23 1326  BP:  104/67 132/68 127/72  Pulse:   83 80  Resp:   20 20  Temp:   97.6 F (36.4 C) 98.2 F (36.8 C)  TempSrc:   Oral Oral  SpO2: 96%  (!) 82% 95%  Weight:      Height:        Intake/Output Summary (Last 24 hours) at 10/20/2023 1421 Last data filed at 10/20/2023 1328 Gross per 24 hour  Intake 960 ml  Output --  Net 960 ml   Filed Weights   10/13/23 1931 10/14/23 0107  Weight: 49 kg 48.9 kg    Examination:  General exam: No acute distress Respiratory  system: Mild tachypnea, bilateral rhonchus Cardiovascular system: S1, S2 regular rhythm and rate Gastrointestinal system: BS present, soft, nt Central nervous system: Alert Extremities: no edema    Data Reviewed: I have personally reviewed following labs and imaging studies  CBC: Recent Labs  Lab 10/14/23 0325 10/14/23 0356 10/14/23 0632 10/15/23 0320 10/16/23 0321 10/17/23 0304 10/18/23 0544 10/19/23 0446  WBC 7.5 DUP PER JAMES PA @0434  TW   < > 8.2 8.7 6.0 7.8 8.9  NEUTROABS 4.8 PENDING  --   --   --   --   --   --   HGB 11.3* DUP PER JAMES PA @0434  TW   < > 11.4* 12.0 10.4* 10.4* 9.9*  HCT 38.9 DUP PER JAMES PA @0434  TW   < > 37.3 38.6 33.6* 33.5* 32.4*  MCV 101.0* DUP PER JAMES PA @0434  TW   < > 100.5* 97.2 98.8 99.1 100.6*  PLT 204 DUP PER JAMES PA @0434  TW   < > 178 180 193 185 193   < > = values in this interval not displayed.   Basic Metabolic Panel: Recent Labs  Lab 10/13/23 1958  10/14/23 0325 10/15/23 0320 10/16/23 0600 10/17/23 0304  NA 140 141 139 130* 134*  K 4.8 3.9 4.0 4.6 4.6  CL 98 97* 97* 90* 93*  CO2 33* 33* 33* 30 32  GLUCOSE 143* 125* 98 125* 124*  BUN 20 19 15 14 15   CREATININE 0.64 0.56 0.60 0.60 0.58  CALCIUM  9.8 9.5 9.4 9.6 9.8   GFR: Estimated Creatinine Clearance: 47.6 mL/min (by C-G formula based on SCr of 0.58 mg/dL). Liver Function Tests: Recent Labs  Lab 10/14/23 0447  AST 20  ALT 9  ALKPHOS 52  BILITOT 0.4  PROT 6.1*  ALBUMIN 3.9   No results for input(s): LIPASE, AMYLASE in the last 168 hours. No results for input(s): AMMONIA in the last 168 hours. Coagulation Profile: No results for input(s): INR, PROTIME in the last 168 hours. Cardiac Enzymes: No results for input(s): CKTOTAL, CKMB, CKMBINDEX, TROPONINI in the last 168 hours. BNP (last 3 results) Recent Labs    07/07/23 1942 10/14/23 0447 10/16/23 0832  PROBNP 655.0* 995.0* 588.0*   HbA1C: No results for input(s): HGBA1C in the last 72 hours. CBG: No results for input(s): GLUCAP in the last  168 hours. Lipid Profile: No results for input(s): CHOL, HDL, LDLCALC, TRIG, CHOLHDL, LDLDIRECT in the last 72 hours. Thyroid Function Tests: No results for input(s): TSH, T4TOTAL, FREET4, T3FREE, THYROIDAB in the last 72 hours. Anemia Panel: No results for input(s): VITAMINB12, FOLATE, FERRITIN, TIBC, IRON, RETICCTPCT in the last 72 hours.  Sepsis Labs: No results for input(s): PROCALCITON, LATICACIDVEN in the last 168 hours.  Recent Results (from the past 240 hours)  MRSA Next Gen by PCR, Nasal     Status: None   Collection Time: 10/14/23  1:02 AM   Specimen: Nasal Mucosa; Nasal Swab  Result Value Ref Range Status   MRSA by PCR Next Gen NOT DETECTED NOT DETECTED Final    Comment: (NOTE) The GeneXpert MRSA Assay (FDA approved for NASAL specimens only), is one component of a comprehensive MRSA colonization  surveillance program. It is not intended to diagnose MRSA infection nor to guide or monitor treatment for MRSA infections. Test performance is not FDA approved in patients less than 64 years old. Performed at [facility], [address] [place], [place] [address]          Radiology Studies: DG CHEST PORT 1 VIEW Result Date: 10/18/2023 CLINICAL DATA:  Dyspnea, increasing shortness of breath with exertion. EXAM: PORTABLE CHEST 1 VIEW COMPARISON:  10/15/2023, 10/13/2023. FINDINGS: The heart size and mediastinal contours are stable. Atherosclerotic calcification of the aorta is noted. Emphysematous changes are noted in the lungs. There is stable cavitation with atelectasis or pneumonia in the mid to upper right lung. Stable airspace disease is present at the right lung base. There is chronic blunting of the right costophrenic angle, likely scarring. No pneumothorax is seen. No acute osseous abnormality. IMPRESSION: 1. Stable known cavitary region in the right lung with associated atelectasis or infiltrate. Follow-up is recommended until resolution. 2. Emphysema. Electronically Signed   By: Leita Birmingham M.D.   On: 10/18/2023 17:08        Scheduled Meds:  albuterol   2.5 mg Nebulization TID   ALPRAZolam   0.25-0.5 mg Oral QHS   apixaban   10 mg Oral BID   Followed by   NOREEN ON 10/24/2023] apixaban   5 mg Oral BID   arformoterol   15 mcg Nebulization BID   atorvastatin   40 mg Oral QHS   azithromycin   500 mg Oral Q M,W,F   budesonide   0.5 mg Nebulization BID   carvedilol   6.25 mg Oral BID WC   Chlorhexidine  Gluconate Cloth  6 each Topical Daily   clopidogrel   75 mg Oral Daily   feeding supplement  1 Container Oral BID BM   folic acid   1 mg Oral Daily   guaiFENesin   1,200 mg Oral BID   lisinopril   5 mg Oral Daily   pantoprazole  (PROTONIX ) IV  40 mg Intravenous Q12H   polyethylene glycol  17 g Oral Daily   predniSONE   20 mg Oral Q breakfast   Followed by   NOREEN ON  10/22/2023] predniSONE   10 mg Oral Q breakfast   revefenacin   175 mcg Nebulization Daily   senna-docusate  1 tablet Oral BID   Continuous Infusions:     LOS: 6 days    Time spent: 35 minutes    Kristy Mcmonigle A Kathleena Freeman, MD [facility]   If 7PM-7AM, please contact night-coverage [email or web address]  10/20/2023, 2:21 PM

## 2023-10-21 DIAGNOSIS — A310 Pulmonary mycobacterial infection: Secondary | ICD-10-CM

## 2023-10-21 DIAGNOSIS — I2510 Atherosclerotic heart disease of native coronary artery without angina pectoris: Secondary | ICD-10-CM | POA: Diagnosis not present

## 2023-10-21 DIAGNOSIS — J441 Chronic obstructive pulmonary disease with (acute) exacerbation: Secondary | ICD-10-CM | POA: Diagnosis not present

## 2023-10-21 DIAGNOSIS — I2699 Other pulmonary embolism without acute cor pulmonale: Secondary | ICD-10-CM | POA: Diagnosis not present

## 2023-10-21 DIAGNOSIS — R042 Hemoptysis: Secondary | ICD-10-CM | POA: Diagnosis not present

## 2023-10-21 LAB — CBC
HCT: 33.6 % — ABNORMAL LOW (ref 36.0–46.0)
Hemoglobin: 10.4 g/dL — ABNORMAL LOW (ref 12.0–15.0)
MCH: 31.1 pg (ref 26.0–34.0)
MCHC: 31 g/dL (ref 30.0–36.0)
MCV: 100.6 fL — ABNORMAL HIGH (ref 80.0–100.0)
Platelets: 189 K/uL (ref 150–400)
RBC: 3.34 MIL/uL — ABNORMAL LOW (ref 3.87–5.11)
RDW: 12.6 % (ref 11.5–15.5)
WBC: 8.3 K/uL (ref 4.0–10.5)
nRBC: 0 % (ref 0.0–0.2)

## 2023-10-21 MED ORDER — FAMOTIDINE 20 MG PO TABS: 20.0000 mg | ORAL_TABLET | Freq: Two times a day (BID) | ORAL | Status: DC | PRN

## 2023-10-21 NOTE — Discharge Summary (Signed)
 Physician Discharge Summary  Kristy Weeks FMW:[id number] DOB: 1950-03-25 DOA: 10/13/2023  PCP: [name], PA-C  Admit date: 10/13/2023 Discharge date: 10/21/2023  Time spent: 60 minutes  Recommendations for Outpatient Follow-up:  Follow-up with Kristy Weeks, Kristy Christopher, PA-C in 1 week.  On follow-up patient will need a basic metabolic profile done to follow-up on electrolytes and renal function.  Patient need a CBC done to follow-up on counts. Follow-up with Kristy Gum, PA, pulmonary in 1 to 2 weeks.   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  Diet recommendation: Heart healthy  Filed Weights   10/13/23 1931 10/14/23 0107  Weight: 49 kg 48.9 kg    History of present illness:  HPI per Dr. [name] is a 73 y.o. female with history of chronic respiratory failure on 4 L oxygen secondary to COPD with chronic cavitary lung lesion and bronchiectasis, CAD status post PCI last in December 2023, hypertension recently admitted in July 2025 for respiratory failure in the setting of COVID infection has been on prolonged antibiotics recently finished a course of doxycycline  and is on chronic steroid therapy for COPD presents to the ER with complaints of hemoptysis yesterday.  Patient states she has been coughing up blood with increasing shortness of breath over the last few hours before coming to the ER.  Denies any chest pain fever or chills.   ED Course: In the ER patient initially required 6 L oxygen but was soon back to her baseline 4 L.  Labs done showed hemoglobin of 12.  CT angiogram of the chest showed right  segmental and subsegmental pulm embolism with no strain pattern also demonstrated large cavitary lesion with chronic consolidation.  Patient was started on heparin  infusion and admitted for further observation.   Hospital Course:  #1 acute pulmonary embolism -Patient noted to present with hemoptysis, increased O2 requirements initially 6 L of O2 from baseline of 4 L O2. - CT angiogram chest done with interval development of right lower lobe segmental and subsegmental pulmonary embolus.  Redemonstration of large cavitary right upper lobe lesion that is continuous with a chronic right lower lobe consolidation and surrounding satellite nodular airspace opacities in the right lower lobe and right middle lobes.  No significant change. - Patient seen in consultation by pulmonary due to hemoptysis. - 2D echo done with normal right ventricular function. - Lower extremity Dopplers done negative for DVT. - On 10/16/2023 patient noted to have transiently required high amount of O2 however felt likely prove not working.  Chest x-ray done was stable.  BNP was not worsening.  Troponins flat. - Patient initially on heparin  and transition to Eliquis . - On 10/20/2023 Plavix  was resumed after discussion with pulmonologist and cardiologist, patient noted to have reported some more amount of hemoptysis, monitored overnight, due to hemoptysis not worsening case discussed with pulmonary again and okay to resume patient's Plavix . - Outpatient follow-up with primary pulmonologist.  2.  History of cavitary lung lesion/history of Mycobacterium avium complex infection/hemoptysis -Patient maintained on home regimen azithromycin . - Patient also placed on hypertonic saline, chest PT, Tranxene make acid inhaler. - Patient improved clinically and will follow-up with primary pulmonologist in the  outpatient setting.  3.  CAD status post PCI 2023  - Patient noted to be on dual antiplatelet therapy prior to admission. - Patient seen  in consultation by cardiology, in regards to dual antiplatelet therapy treatment for CAD, now that patient will require DOAC and has hemoptysis. - Patient maintained on Lipitor. - Patient's aspirin  and Plavix  were held and Plavix  resumed on 10/20/2023 after discussion with cardiology and pulmonary.  4.  Chronic hypoxic respiratory failure, 4 L home O2/COPD -Patient seen by pulmonary during the hospitalization. - Patient maintained on Pulmicort , Brovana , Incruse, scheduled albuterol , guaifenesin . - Home regimen azithromycin  resumed. - Outpatient follow-up with primary pulmonologist may benefit from repeat bronchoscopy biopsy due to recent PE. - Patient on prednisone  taper for COPD.  5.  Hypertension -Patient maintained on home regimen carvedilol , lisinopril .  6.  Hyponatremia -Improved.  Procedures: CT angiogram chest 10/13/2023 Chest x-ray 10/16/2023, 10/18/2023 2D echo 10/14/2023 Lower extremity Dopplers 10/14/2023   Consultations: Pulmonary/PCCM: Dr.Alghanim 10/14/2023 Cardiology: Dr. Lavona 10/14/2023  Discharge Exam: Vitals:   10/21/23 1400 10/21/23 1641  BP: 123/86 122/75  Pulse: 75 72  Resp: 20   Temp: 97.6 F (36.4 C)   SpO2: 100%     General: NAD Cardiovascular: RRR no murmurs rubs or gallops.  No JVD.  No lower extremity edema. Respiratory: Clear to auscultation bilaterally.  No wheezes, no crackles, no rhonchi.  Fair air movement.  Speaking full sentences.  Discharge Instructions   Discharge Instructions     Diet - low sodium heart healthy   Complete by: As directed    Increase activity slowly   Complete by: As directed    Increase activity slowly   Complete by: As directed       Allergies as of 10/21/2023       Reactions   Cefdinir Other (See Comments)   Abdominal pain   Hydrocodone -acetaminophen  Nausea Only        Medication List     STOP taking these medications    amoxicillin -clavulanate 875-125 MG tablet Commonly known as: AUGMENTIN     aspirin  EC 81 MG tablet       TAKE these medications    acetaminophen  500 MG tablet Commonly known as: TYLENOL  Take 1,000 mg by mouth every 6 (six) hours as needed for moderate pain (pain score 4-6) or mild pain (pain score 1-3).   albuterol  108 (90 Base) MCG/ACT inhaler Commonly known as: VENTOLIN  HFA Inhale 2 puffs into the lungs every 4 (four) hours as needed for wheezing or shortness of breath.   ALPRAZolam  0.5 MG tablet Commonly known as: XANAX  Take 0.25 mg by mouth 2 (two) times daily as needed for sleep or anxiety.   apixaban  5 MG Tabs tablet Commonly known as: ELIQUIS  Take 2 tablets (10 mg total) by mouth 2 (two) times daily for 4 days, THEN 1 tablet (5 mg total) 2 (two) times daily. Start taking on: October 20, 2023   atorvastatin  40 MG tablet Commonly known as: LIPITOR Take 40 mg by mouth every evening.   azithromycin  500 MG tablet Commonly known as: ZITHROMAX  Take 500 mg by mouth every Monday, Wednesday, and Friday.   benzonatate 100 MG capsule Commonly known as: TESSALON Take 100 mg by mouth 3 (three) times daily as needed for cough.   budesonide  0.5 MG/2ML nebulizer solution Commonly known as: PULMICORT  Take 0.5 mg by nebulization every 12 (twelve) hours.   carvedilol  6.25 MG tablet Commonly known as: COREG  Take 1 tablet (6.25 mg total) by mouth 2 (two)  times daily with a meal. What changed:  how much to take when to take this Another medication with the same name was removed. Continue taking this medication, and follow the directions you see here.   clopidogrel  75 MG tablet Commonly known as: PLAVIX  Take 75 mg by mouth in the morning.   famotidine  20 MG tablet Commonly known as: PEPCID  Take 20 mg by mouth 2 (two) times daily as needed for heartburn.   folic acid  1 MG tablet Commonly known as: FOLVITE  Take 1 mg by mouth daily.   formoterol  20 MCG/2ML nebulizer solution Commonly known as: PERFOROMIST  Take 20 mcg by nebulization 2 (two)  times daily.   guaiFENesin  600 MG 12 hr tablet Commonly known as: MUCINEX  Take 2 tablets (1,200 mg total) by mouth 2 (two) times daily.   ipratropium-albuterol  0.5-2.5 (3) MG/3ML Soln Commonly known as: DUONEB Take 3 mLs by nebulization See admin instructions. Nebulize 3 ml's and inhale into the lungs every 5 hours   lisinopril  5 MG tablet Commonly known as: ZESTRIL  Take 5 mg by mouth every evening.   nitroGLYCERIN  0.4 MG SL tablet Commonly known as: NITROSTAT  Place 0.4 mg under the tongue every 5 (five) minutes as needed for chest pain.   OXYGEN Inhale 4 L/min into the lungs continuous.   pantoprazole  40 MG tablet Commonly known as: Protonix  Take 1 tablet (40 mg total) by mouth daily.   polyethylene glycol 17 g packet Commonly known as: MIRALAX  / GLYCOLAX  Take 17 g by mouth daily.   predniSONE  10 MG tablet Commonly known as: DELTASONE  Take 2 tablets (20 mg total) by mouth daily with breakfast for 3 days, THEN 1 tablet (10 mg total) daily with breakfast. Start taking on: October 21, 2023 What changed:  See the new instructions. Another medication with the same name was removed. Continue taking this medication, and follow the directions you see here.   Yupelri  175 MCG/3ML nebulizer solution Generic drug: revefenacin  Take 175 mcg by nebulization in the morning.       Allergies  Allergen Reactions   Cefdinir Other (See Comments)    Abdominal pain   Hydrocodone -Acetaminophen  Nausea Only    Follow-up Information     [name], PA-C Follow up in 1 week(s).   Specialty: Physician Assistant Contact information: [address] [place] [address] [phone]         Laymon Soulier, MD. Schedule an appointment as soon as possible for a visit in 2 week(s).   Specialty: Physician Assistant Why: Follow-up in 1 to 2 weeks. Contact information: [address] [place] [address] [phone]                   The results of significant diagnostics from this hospitalization (including imaging, microbiology, ancillary and laboratory) are listed below for reference.    Significant Diagnostic Studies: DG CHEST PORT 1 VIEW Result Date: 10/18/2023 CLINICAL DATA:  Dyspnea, increasing shortness of breath with exertion. EXAM: PORTABLE CHEST 1 VIEW COMPARISON:  10/15/2023, 10/13/2023. FINDINGS: The heart size and mediastinal contours are stable. Atherosclerotic calcification of the aorta is noted. Emphysematous changes are noted in the lungs. There is stable cavitation with atelectasis or pneumonia in the mid to upper right lung. Stable airspace disease is present at the right lung base. There is chronic blunting of the right costophrenic angle, likely scarring. No pneumothorax is seen. No acute osseous abnormality. IMPRESSION: 1. Stable known cavitary region in the right lung with associated atelectasis  or infiltrate. Follow-up is recommended until resolution. 2. Emphysema. Electronically Signed   By: Leita Birmingham M.D.   On: 10/18/2023 17:08   DG Abd 1 View Result Date: 10/16/2023 CLINICAL DATA:  Nausea. EXAM: ABDOMEN - 1 VIEW COMPARISON:  None Available. FINDINGS: No abnormal bowel dilatation is noted. Mild amount of stool seen throughout the colon. IMPRESSION: No abnormal bowel dilatation. Electronically Signed   By: Lynwood Landy Raddle M.D.   On: 10/16/2023 08:23   DG CHEST PORT 1 VIEW Result Date: 10/16/2023 CLINICAL DATA:  Acute hypoxic respiratory failure. EXAM: PORTABLE CHEST 1 VIEW COMPARISON:  August 03, 2023.  October 13, 2023. FINDINGS: Stable cardiomediastinal silhouette. Stable cavitary abnormality seen in right upper lobe which may represent either cavitary malignancy or pneumonia. Emphysematous disease is noted. Mild right basilar atelectasis, scarring or inflammation is noted. Bony thorax is unremarkable. IMPRESSION: Stable cavitary abnormality seen in right upper lobe which may represent either  cavitary malignancy or pneumonia. Mild right basilar atelectasis, scarring or inflammation is noted. Electronically Signed   By: Lynwood Landy Raddle M.D.   On: 10/16/2023 08:23   VAS US  LOWER EXTREMITY VENOUS (DVT) Result Date: 10/14/2023  Lower Venous DVT Study Patient Name:  ISMA TIETJE  Date of Exam:   10/14/2023 Medical Rec #: [id number]         Accession #:    [id number] Date of Birth: April 06, 1950        Patient Gender: F Patient Age:   31 years Exam Location:  [facility] Procedure:      VAS US  LOWER EXTREMITY VENOUS (DVT) Referring Phys: OWEN REGALADO --------------------------------------------------------------------------------  Indications: Edema, and pulmonary embolism.  Risk Factors: Confirmed PE. Anticoagulation: Heparin . Limitations: Poor ultrasound/tissue interface. Comparison Study: No prior studies. Performing Technologist: Cordella Collet RVT  Examination Guidelines: A complete evaluation includes B-mode imaging, spectral Doppler, color Doppler, and power Doppler as needed of all accessible portions of each vessel. Bilateral testing is considered an integral part of a complete examination. Limited examinations for reoccurring indications may be performed as noted. The reflux portion of the exam is performed with the patient in reverse Trendelenburg.  +---------+---------------+---------+-----------+----------+--------------+ RIGHT    CompressibilityPhasicitySpontaneityPropertiesThrombus Aging +---------+---------------+---------+-----------+----------+--------------+ CFV      Full           Yes      Yes                                 +---------+---------------+---------+-----------+----------+--------------+ SFJ      Full                                                        +---------+---------------+---------+-----------+----------+--------------+ FV Prox  Full                                                         +---------+---------------+---------+-----------+----------+--------------+ FV Mid   Full                                                        +---------+---------------+---------+-----------+----------+--------------+  FV DistalFull                                                        +---------+---------------+---------+-----------+----------+--------------+ PFV      Full                                                        +---------+---------------+---------+-----------+----------+--------------+ POP      Full           Yes      Yes                                 +---------+---------------+---------+-----------+----------+--------------+ PTV      Full                                                        +---------+---------------+---------+-----------+----------+--------------+ PERO     Full                                                        +---------+---------------+---------+-----------+----------+--------------+   +---------+---------------+---------+-----------+----------+--------------+ LEFT     CompressibilityPhasicitySpontaneityPropertiesThrombus Aging +---------+---------------+---------+-----------+----------+--------------+ CFV      Full           Yes      Yes                                 +---------+---------------+---------+-----------+----------+--------------+ SFJ      Full                                                        +---------+---------------+---------+-----------+----------+--------------+ FV Prox  Full                                                        +---------+---------------+---------+-----------+----------+--------------+ FV Mid   Full                                                        +---------+---------------+---------+-----------+----------+--------------+ FV DistalFull                                                         +---------+---------------+---------+-----------+----------+--------------+  PFV      Full                                                        +---------+---------------+---------+-----------+----------+--------------+ POP      Full           Yes      Yes                                 +---------+---------------+---------+-----------+----------+--------------+ PTV      Full                                                        +---------+---------------+---------+-----------+----------+--------------+ PERO     Full                                                        +---------+---------------+---------+-----------+----------+--------------+     Summary: RIGHT: - There is no evidence of deep vein thrombosis in the lower extremity.  - No cystic structure found in the popliteal fossa.  LEFT: - There is no evidence of deep vein thrombosis in the lower extremity.  - No cystic structure found in the popliteal fossa.  *See table(s) above for measurements and observations. Electronically signed by Fonda Rim on 10/14/2023 at 2:51:33 PM.    Final    ECHOCARDIOGRAM COMPLETE Result Date: 10/14/2023    ECHOCARDIOGRAM REPORT   Patient Name:   Robynn Engelbrecht Date of Exam: 10/14/2023 Medical Rec #:  [id number]        Height:       64.0 in Accession #:    [id number]       Weight:       107.8 lb Date of Birth:  August 08, 1950       BSA:          1.504 m Patient Age:    72 years         BP:           163/90 mmHg Patient Gender: F                HR:           83 bpm. Exam Location:  Inpatient Procedure: 3D Echo, Color Doppler and Cardiac Doppler (Both Spectral and Color            Flow Doppler were utilized during procedure). Indications:    Pulmonary Embolus I26.09  History:        Patient has prior history of Echocardiogram examinations, most                 recent 07/08/2023.  Sonographer:    Tinnie Gosling RDCS Referring Phys: [phone] BELKYS A REGALADO IMPRESSIONS  1. Mild septal hypokinesis. Left  ventricular ejection fraction, by estimation, is 60 to 65%. The left ventricle has normal function. The left ventricle demonstrates regional wall motion abnormalities (see scoring diagram/findings for description). Left ventricular diastolic parameters are  consistent with Grade I diastolic dysfunction (impaired relaxation).  2. Right ventricular systolic function is normal. The right ventricular size is normal. There is mildly elevated pulmonary artery systolic pressure.  3. The mitral valve is normal in structure. Trivial mitral valve regurgitation. No evidence of mitral stenosis.  4. The aortic valve is calcified. There is mild calcification of the aortic valve. There is mild thickening of the aortic valve. Aortic valve regurgitation is mild. No aortic stenosis is present. Aortic regurgitation PHT measures 622 msec.  5. The inferior vena cava is normal in size with greater than 50% respiratory variability, suggesting right atrial pressure of 3 mmHg. FINDINGS  Left Ventricle: Mild septal hypokinesis. Left ventricular ejection fraction, by estimation, is 60 to 65%. The left ventricle has normal function. The left ventricle demonstrates regional wall motion abnormalities. The left ventricular internal cavity size was normal in size. There is no left ventricular hypertrophy. Left ventricular diastolic parameters are consistent with Grade I diastolic dysfunction (impaired relaxation). Indeterminate filling pressures.  LV Wall Scoring: The inferior wall, mid inferoseptal segment, and basal inferoseptal segment are hypokinetic. The entire anterior wall, entire lateral wall, entire anterior septum, and entire apex are normal. Right Ventricle: The right ventricular size is normal. No increase in right ventricular wall thickness. Right ventricular systolic function is normal. There is mildly elevated pulmonary artery systolic pressure. The tricuspid regurgitant velocity is 2.90  m/s, and with an assumed right atrial  pressure of 3 mmHg, the estimated right ventricular systolic pressure is 36.6 mmHg. Left Atrium: Left atrial size was normal in size. Right Atrium: Right atrial size was normal in size. Pericardium: There is no evidence of pericardial effusion. Mitral Valve: The mitral valve is normal in structure. Trivial mitral valve regurgitation. No evidence of mitral valve stenosis. Tricuspid Valve: The tricuspid valve is normal in structure. Tricuspid valve regurgitation is trivial. No evidence of tricuspid stenosis. Aortic Valve: The aortic valve is calcified. There is mild calcification of the aortic valve. There is mild thickening of the aortic valve. Aortic valve regurgitation is mild. Aortic regurgitation PHT measures 622 msec. No aortic stenosis is present. Pulmonic Valve: The pulmonic valve was normal in structure. Pulmonic valve regurgitation is not visualized. No evidence of pulmonic stenosis. Aorta: The aortic root is normal in size and structure. Venous: The inferior vena cava is normal in size with greater than 50% respiratory variability, suggesting right atrial pressure of 3 mmHg. IAS/Shunts: No atrial level shunt detected by color flow Doppler.  LEFT VENTRICLE PLAX 2D LVIDd:         4.40 cm   Diastology LVIDs:         3.10 cm   LV e' medial:    3.59 cm/s LV PW:         1.00 cm   LV E/e' medial:  13.6 LV IVS:        1.00 cm   LV e' lateral:   4.35 cm/s LVOT diam:     2.10 cm   LV E/e' lateral: 11.2 LV SV:         48 LV SV Index:   32 LVOT Area:     3.46 cm  IVC IVC diam: 1.60 cm LEFT ATRIUM         Index LA diam:    2.90 cm 1.93 cm/m  AORTIC VALVE LVOT Vmax:   103.00 cm/s LVOT Vmean:  65.600 cm/s LVOT VTI:    0.140 m AI PHT:      622 msec  AORTA Ao Root diam: 3.00 cm Ao Asc diam:  3.60 cm MITRAL VALVE               TRICUSPID VALVE MV Area (PHT): 2.71 cm    TR Peak grad:   33.6 mmHg MV Decel Time: 280 msec    TR Vmax:        290.00 cm/s MV E velocity: 48.70 cm/s MV A velocity: 84.60 cm/s  SHUNTS MV E/A ratio:   0.58        Systemic VTI:  0.14 m                            Systemic Diam: 2.10 cm Annabella Scarce MD Electronically signed by Annabella Scarce MD Signature Date/Time: 10/14/2023/12:50:16 PM    Final    CT Angio Chest PE W and/or Wo Contrast Result Date: 10/13/2023 CLINICAL DATA:  Pulmonary embolism (PE) suspected, high prob hemopytosis c/o hemoptysis x 3 hours. Increased O2 demands. Family reports persistent congested cough since July EXAM: CT ANGIOGRAPHY CHEST WITH CONTRAST TECHNIQUE: Multidetector CT imaging of the chest was performed using the standard protocol during bolus administration of intravenous contrast. Multiplanar CT image reconstructions and MIPs were obtained to evaluate the vascular anatomy. RADIATION DOSE REDUCTION: This exam was performed according to the departmental dose-optimization program which includes automated exposure control, adjustment of the mA and/or kV according to patient size and/or use of iterative reconstruction technique. CONTRAST:  70mL OMNIPAQUE  IOHEXOL  350 MG/ML SOLN COMPARISON:  CT chest 03/16/2023, CT angio chest 01/19/2023 FINDINGS: Cardiovascular: Satisfactory opacification of the pulmonary arteries to the segmental level. Interval development of a right lower lobe segmental and subsegmental pulmonary embolus (5:94, 7:93). Abrupt cut off of a right upper lobe pulmonary artery again noted due to large cavitary mass. The left and right main pulmonary arteries are enlarged in size. Normal heart size. No significant pericardial effusion. The ascending thoracic aorta is normal in caliber. The descending thoracic aorta is enlarged in caliber measuring up to 3.7 cm-stable. Severe atherosclerotic plaque of the thoracic aorta. Four-vessel coronary artery calcifications. Mediastinum/Nodes: No enlarged mediastinal, hilar, or axillary lymph nodes. Thyroid gland, trachea, and esophagus demonstrate no significant findings. Lungs/Pleura: Right lung volume loss. Severe emphysematous  changes. Redemonstration of a large cavitary mass at the right apex measuring approximately 10 x 6 cm with interval slight decrease in internal layering fluid. Right upper lobe bronchiectasis. Similar-appearing right lower lobe consolidation that extends superiorly in use with the right upper lobe cavitary mass. Redemonstration of patchy nodular like airspace opacities within the right lower lobe and right middle lobe. No pleural effusion. No pneumothorax. 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 Aneurysmal partially visualized infrarenal abdominal aorta. Atrophic right kidney. 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 Musculoskeletal: No chest wall abnormality. No suspicious lytic or blastic osseous lesions. No acute displaced fracture. Multilevel degenerative changes of the spine. Review of the MIP images confirms the above findings. IMPRESSION: 1. Interval development of a right lower lobe segmental and subsegmental pulmonary embolus. No associated right heart strain or pulmonary infarction. 2. Redemonstration of large cavitary right upper lobe lesion that is continuous with a chronic right lower lobe consolidation and surrounding satellite nodular airspace opacities in the right lower lobe and right middle lobes. No significant change. Finding may be infectious or malignant in etiology. 3. Emphysema (ICD10-J43.9)-severe. 4. Aortic Atherosclerosis (ICD10-I70.0) -severe including four-vessel coronary artery calcification. 5. 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 6. 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 Aneurysmal partially visualized infrarenal abdominal aorta. Consider outpatient CTA abdomen pelvis for further evaluation. 7. 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 8. Atrophic right kidney. Electronically Signed   By: Morgane  Naveau M.D.   On: 10/13/2023 22:14     Microbiology: Recent Results (from the past 240 hours)  MRSA Next Gen by PCR, Nasal     Status: None   Collection Time: 10/14/23  1:02 AM   Specimen: Nasal Mucosa; Nasal Swab  Result Value Ref Range Status   MRSA by PCR Next Gen NOT DETECTED NOT DETECTED Final    Comment: (NOTE) The GeneXpert MRSA Assay (FDA approved for NASAL specimens only), is one component of a comprehensive MRSA colonization surveillance program. It is not intended to diagnose MRSA infection nor to guide or monitor treatment for MRSA infections. Test performance is not FDA approved in patients less than 81 years old. Performed at [facility], [address], [place], [place] [address]      Labs: Basic Metabolic Panel: Recent Labs  Lab 10/15/23 0320 10/16/23 0600 10/17/23 0304  NA 139 130* 134*  K 4.0 4.6 4.6  CL 97* 90* 93*  CO2 33* 30 32  GLUCOSE 98 125* 124*  BUN 15 14 15   CREATININE 0.60 0.60 0.58  CALCIUM  9.4 9.6 9.8   Liver Function Tests: No results for input(s): AST, ALT, ALKPHOS, BILITOT, PROT, ALBUMIN in the last 168 hours. No results for input(s): LIPASE, AMYLASE in the last 168 hours. No results for input(s): AMMONIA in the last 168 hours. CBC: Recent Labs  Lab 10/16/23 0321 10/17/23 0304 10/18/23 0544 10/19/23 0446 10/21/23 0512  WBC 8.7 6.0 7.8 8.9 8.3  HGB 12.0 10.4* 10.4* 9.9* 10.4*  HCT 38.6 33.6* 33.5* 32.4* 33.6*  MCV 97.2 98.8 99.1 100.6* 100.6*  PLT 180 193 185 193 189   Cardiac Enzymes: No results for input(s): CKTOTAL, CKMB, CKMBINDEX, TROPONINI in the last 168 hours. BNP: BNP (last 3 results) No results for input(s): BNP in the last 8760 hours.  ProBNP (last 3 results) Recent Labs    07/07/23 1942 10/14/23 0447 10/16/23 0832  PROBNP 655.0* 995.0* 588.0*    CBG: No results for input(s): GLUCAP in the last 168 hours.     Signed:  Toribio Hummer MD.  [facility] 10/21/2023, 6:14 PM

## 2023-10-21 NOTE — TOC Transition Note (Signed)
 Transition of Care [facility]) - Discharge Note   Patient Details  Name: Kristy Weeks MRN: [id number] Date of Birth: [birth year]  Transition [facility]) CM/SW Contact:  Bascom Service, RN Phone Number: 10/21/2023, 3:54 PM   Clinical Narrative:  d/c home Has home 02, & transport.No further CM needs.     Final next level of care: Home/Self Care Barriers to Discharge: No Barriers Identified   Patient Goals and CMS Choice Patient states their goals for this hospitalization and ongoing recovery are:: Home CMS Medicare.gov Compare Post Acute Care list provided to:: Patient Choice offered to / list presented to : Patient [place] ownership interest in [facility].provided to:: Patient    Discharge Placement                       Discharge Plan and Services Additional resources added to the After Visit Summary for     Discharge Planning Services: CM Consult Post Acute Care Choice: Home Health                               Social Drivers of Health (SDOH) Interventions SDOH Screenings   Food Insecurity: No Food Insecurity (10/14/2023)  Housing: Low Risk  (10/14/2023)  Transportation Needs: No Transportation Needs (10/14/2023)  Utilities: Not At Risk (10/14/2023)  Social Connections: Socially Isolated (10/14/2023)  Tobacco Use: Medium Risk (10/14/2023)     Readmission Risk Interventions    10/19/2023    1:41 PM 07/08/2023    2:02 PM 02/19/2023    1:48 PM  Readmission Risk Prevention Plan  Post Dischage Appt   Complete  Medication Screening   Complete  Transportation Screening Complete Complete Complete  PCP or Specialist Appt within 5-7 Days  Complete   PCP or Specialist Appt within 3-5 Days Complete    Home Care Screening  Complete   Medication Review (RN CM)  Complete   HRI or Home Care Consult Complete    Social Work Consult for Recovery Care Planning/Counseling Complete    Palliative Care Screening Not Applicable    Medication Review Furniture conservator/restorer) Complete

## 2023-10-21 NOTE — Progress Notes (Incomplete)
 PROGRESS NOTE    Kristy Weeks  FMW:[id number] DOB: October 07, 1950 DOA: 10/13/2023 PCP: Podraza, Cole Christopher, PA-C (Confirm with patient/family/NH records and if not entered, this HAS to be entered at [facility] point of entry. 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 NO discussed with patient) Disposition:   Status is: Inpatient {Inpatient:23812}   Consultants:  ***  Procedures: (Don't include imaging studies which can be auto populated. Include things that cannot be auto populated i.e. Echo, Carotid and venous dopplers, Foley, Bipap, HD, tubes/drains, wound vac, central lines etc) ***  Antimicrobials: (specify start and planned stop date. Auto populated tables are space occupying and do not give end dates) ***    Subjective: Patient laying in bed.  Still with some hemoptysis but has not worsened over the past 24 hours.  Denies any chest pain.  Hoping to be able to go home today.  Noted to have held Plavix  this morning until further direction by pulmonary per RN.  Objective: Vitals:   10/20/23 2238 10/21/23 0546 10/21/23 0818 10/21/23 0843  BP: 117/72 127/63  103/68  Pulse: 76 68  75  Resp:  18    Temp: 98.2 F (36.8 C) 98 F (36.7 C)  97.7 F (36.5 C)  TempSrc: Oral Oral  Oral  SpO2: 98% 98% 96% 98%   Weight:      Height:        Intake/Output Summary (Last 24 hours) at 10/21/2023 1247 Last data filed at 10/21/2023 0354 Gross per 24 hour  Intake 480 ml  Output --  Net 480 ml   Filed Weights   10/13/23 1931 10/14/23 0107  Weight: 49 kg 48.9 kg    Examination:  General exam: Appears calm and comfortable  Respiratory system: Clear to auscultation. Respiratory effort normal. Cardiovascular system: S1 & S2 heard, RRR. No JVD, murmurs, rubs, gallops or clicks. No pedal edema. Gastrointestinal system: Abdomen is nondistended, soft and nontender. No organomegaly or masses felt. Normal bowel sounds heard. Central nervous system: Alert and oriented. No focal neurological deficits. Extremities: Symmetric 5 x 5 power. Skin: No rashes, lesions or ulcers Psychiatry: Judgement and insight appear normal. Mood & affect appropriate.     Data Reviewed: I have personally reviewed following labs and imaging studies  CBC: Recent Labs  Lab 10/16/23 0321 10/17/23 0304 10/18/23 0544 10/19/23 0446 10/21/23 0512  WBC 8.7 6.0 7.8 8.9 8.3  HGB 12.0 10.4* 10.4* 9.9* 10.4*  HCT 38.6 33.6* 33.5* 32.4* 33.6*  MCV 97.2 98.8 99.1 100.6* 100.6*  PLT 180 193 185 193 189    Basic Metabolic Panel: Recent Labs  Lab 10/15/23 0320 10/16/23 0600 10/17/23 0304  NA 139 130* 134*  K 4.0 4.6 4.6  CL 97* 90* 93*  CO2 33* 30 32  GLUCOSE 98 125* 124*  BUN 15 14 15   CREATININE 0.60 0.60 0.58  CALCIUM  9.4 9.6 9.8    GFR: Estimated Creatinine Clearance: 47.6 mL/min (by C-G formula based on SCr of 0.58 mg/dL).  Liver Function Tests: No results for input(s): AST, ALT, ALKPHOS, BILITOT, PROT, ALBUMIN in the last 168 hours.  CBG: No results for input(s): GLUCAP in the last 168 hours.   Recent Results (from the past 240 hours)  MRSA Next Gen by PCR, Nasal     Status: None   Collection Time: 10/14/23  1:02 AM   Specimen: Nasal Mucosa; Nasal Swab  Result Value Ref Range Status   MRSA  by PCR Next Gen NOT DETECTED NOT DETECTED Final    Comment: (NOTE) The GeneXpert MRSA Assay (FDA approved for NASAL specimens only), is one component of a comprehensive MRSA colonization surveillance program. It is not intended to diagnose MRSA infection nor to guide or monitor treatment for MRSA infections. Test performance is not FDA approved in patients less than 35 years old. Performed at [facility], [address], [place], [place] [address]          Radiology Studies: No results found.      Scheduled Meds:  albuterol   2.5 mg Nebulization TID   ALPRAZolam   0.25-0.5 mg Oral QHS   apixaban   10 mg Oral BID   Followed by   NOREEN ON 10/24/2023] apixaban   5 mg Oral BID   arformoterol   15 mcg Nebulization BID   atorvastatin   40 mg Oral QHS   azithromycin   500 mg Oral Q M,W,F   budesonide   0.5 mg Nebulization BID   carvedilol   6.25 mg Oral BID WC   Chlorhexidine  Gluconate Cloth  6 each Topical Daily   clopidogrel   75 mg Oral Daily   feeding supplement  1 Container Oral BID BM   folic acid   1 mg Oral Daily   guaiFENesin   1,200 mg Oral BID   lisinopril   5 mg Oral Daily   pantoprazole  (PROTONIX ) IV  40 mg Intravenous Q12H   polyethylene glycol  17 g Oral Daily   [START ON 10/22/2023] predniSONE   10 mg Oral Q breakfast   revefenacin   175 mcg Nebulization Daily   senna-docusate  1 tablet Oral BID   Continuous Infusions:   LOS: 7 days    Time spent: ***    Toribio Hummer, MD [facility]   To contact the attending provider between 7A-7P or the covering provider during after hours 7P-7A, please log into the web site [email or web address] and access using universal [place] password for that web site. If you do not have the password, please call the hospital operator.  10/21/2023, 12:47 PM

## 2023-10-21 NOTE — Progress Notes (Signed)
 Mobility Specialist - Progress Note  (6 L [place]) Pre-mobility: 98% SpO2 During mobility: 91% SpO2 Post-mobility: 93% SPO2    10/21/23 0938  Mobility  Activity Ambulated with assistance  Level of Assistance Contact guard assist, steadying assist  Assistive Device Front wheel walker  Distance Ambulated (ft) 50 ft  Range of Motion/Exercises Active  Activity Response Tolerated well  Mobility Referral Yes  Mobility visit 1 Mobility  Mobility Specialist Start Time (ACUTE ONLY) 0920  Mobility Specialist Stop Time (ACUTE ONLY) 0935  Mobility Specialist Time Calculation (min) (ACUTE ONLY) 15 min   Pt was received in bed and agreed to mobility. 1x small break during ambulation due to lightheadedness. Returned back to bed with all needs met and call bell in reach.  [employer] - Mobility Specialist

## 2023-10-21 NOTE — Progress Notes (Signed)
 Chaplain responded to unit page requesting assistance for notarizing AD documents. Unfortunately no notary service available at this time. I offered alternative solutions for post-discharge, including pt Asiya's bank/credit union, a traveling notary, or visiting [place] tomorrow to have those documents completed there.

## 2023-12-25 NOTE — Progress Notes (Signed)
 [facility]  INFECTIOUS DISEASE OUTPATIENT PROGRESS NOTE ( [place])             Kristy Weeks Date of Birth: 07-Feb-1950 MRN: [id number] Patient seen and examined by me on 12/28/23       Reason for follow up: [name]   Assessment and Plan: 1.  Chronic cavitary pulmonary aspergillosis 2.  Recent PE  3.  History of pulmonary MAC, completed treatment on 12/08/2019 4.  Long-term antibiotic   She remains on treatment with with voriconazole 200 mg twice daily.  Noted to have right apical cavitary lesion and right lower lobe nodules  Serum galactomannan is elevated and fungal cultures from 10/02/2023 and 11/08/2023 positive for aspergillosis.  She does have a prior history of pulmonary MAC completed treatment on 12/08/2019.  Sputum for AFB obtained on 11/08/2023, 10/02/2023 have been negative.  She reports acute onset right chest wall and scapular pain overnight.  She was seen at urgent care and x-ray was done which showed no acute abnormalities, chronic changes noted in the right upper lobe.  Does not report hemoptysis or productive sputum.  At this time I have recommended that she continue with Tylenol  and gabapentin for pain.  If there is no improvement in the pain over the next 48 hours, we will pursue CT imaging of the chest.  We will obtain a CBC with differential, CMP and voriconazole levels today.     Please note that those non-infectious problems listed above as active problems directly impact the patient's risk for infection, ability to respond to treatment and clear infections, or impact the selection or dosing of antibiotics, or are directly influenced by infection, treatment of infection, or relate to monitoring of infection and/or potential toxicities of antimicrobial therapy.  Chief complaint:  She experienced pain in the right chest wall and had to go to urgent care  History of presenting illness: She has a past medical history there is  significant for bronchiectasis, she is currently on 4 L of oxygen at home. Recent history of acute PE on 10/13/2023 on Eliquis . She also has a history of pulmonary MAC. She was treated with azithromycin  ethambutol and rifampin and completed a 1 year course of treatment from initial negative sputum cultures on 12/08/2019. She was admitted to the hospital with pleuritic chest pain and hemoptysis.  She underwent a CT scan of the chest on 11/07/2023 that showed right upper lobe cavitation and right lower lobe nodularity. Sputum cultures from 10/02/2023 noted to be positive for Aspergillus.  Respiratory cultures were positive for Aspergillus from 11/08/2023.  Sputum for AFB from 11/08/2023 have been negative was negative.  She was initiated on treatment with voriconazole 200 mg twice daily on 11/11/2023.  She is tolerating that reasonably well with occasional vision problems.  She notes that yesterday she developed right sided chest wall and back pain.  She was seen at urgent care.  She underwent chest x-ray which did not show acute abnormalities, chronic right lung changes were noted.  She denies cough productive sputum or hemoptysis. Medications:  Current Antibiotic Drug: Start Date: End date: Duration/Other:  Voriconazole 11/11/2023                        Current Medications[1]   Allergies: Vicodin [hydrocodone -acetaminophen ] and Cefdinir    Medical History[2]  Surgical History[3]  Social History  Social:  Social History   Tobacco Use  . Smoking status: Former    Current packs/day:  0.00    Average packs/day: 1.0 packs/day    Types: Cigarettes    Quit date: 05/27/2016    Years since quitting: 7.5    Passive exposure: Past  . Smokeless tobacco: Never  Substance Use Topics  . Alcohol use: No      Family History  Family history has been reviewed  Family History[4]    Review of systems: Gen: Denies fevers chills HEENT: Denies sore throat or difficulty swallowing, denies vision  problems Neck: Denies Neck pain or neck stiffness Cardiovascular: Denies chest pain or palpitations Respiratory: Denies cough, denies productive sputum.  Denies chest pain, No SOB Gastrointestinal: Denies diarrhea.  Denies vomiting.  Denies abdominal pain Genitourinary: Denies dysuria, denies hematuria.  Denies urethral discharge Skin: Denies skin rash   Denies itching Musculoskeletal: Denies joint pain denies joint swelling.  Denies pain or redness in the extremities CNS: Denies headache.  Denies vision problems.  Denies weakness or numbness in any extremity Endocrine: Denies increased urinary frequency.  Denies fatigue Hematological: Denies bleeding tendency   Objective  There is no height or weight on file to calculate BMI.  Vital signs last 24 hours: Vitals:   12/29/23 1340  BP: 130/81  BP Location: Right arm  Patient Position: Sitting  Pulse: 64  Resp: 18  Temp: 97.4 F (36.3 C)  TempSrc: Oral  SpO2: 90%  Weight: 50.3 kg (111 lb)  Height: 1.626 m (5' 4)  Appreciate   Physical Exam: General appearance - alert, well appearing, and in no distress and oriented to person, place, and time on supplemental oxygen Mouth - mucous membranes moist, pharynx normal without lesions. Neck  -supple Endocrine-no thyroid enlargement Chest - clear to auscultation, no wheezes, rales or rhonchi, symmetric air entry Heart - normal rate and regular rhythm Abdomen - soft, nontender, nondistended, no masses or organomegaly Neurological - alert, oriented, normal speech, no focal findings or movement disorder noted Extremities - no pedal edema noted   Labs:   No results for input(s): WBC, HGB, HCT, PLT, NEUTOPHILPCT, EOSPCT in the last 72 hours. No results for input(s): NA, K, CL, CO2, BUN, CREATININE, CALCIUM , PROT, BILITOT, ALP, ALT, AST, GLUCOSE in the last 72 hours.  Lab Results  Component Value Date   NA 140 11/25/2023   NA 141 11/12/2023   K  3.8 11/25/2023   K 4.1 11/12/2023   CL 95 (L) 11/25/2023   CL 95 (L) 11/12/2023   CO2 41 (HH) 11/25/2023   CO2 44 (HH) 11/12/2023   BUN 16 11/25/2023   BUN 15 11/12/2023   GLUCOSE 115 (H) 11/25/2023   GLUCOSE 86 11/12/2023   CREATININE 0.74 11/25/2023   CREATININE 0.59 (L) 11/12/2023   CALCIUM  9.1 11/25/2023   CALCIUM  9.0 11/12/2023   PROT 6.0 (L) 11/25/2023   PROT 6.8 11/06/2023   ALBUMIN 3.9 11/25/2023   ALBUMIN 4.0 11/06/2023   BILITOT 0.3 11/25/2023   BILITOT 0.4 11/06/2023   ALP 82 11/25/2023   ALP 59 11/06/2023   AST 12 (L) 11/25/2023   AST 12 (L) 11/06/2023   ALT 6 (L) 11/25/2023   ALT 9 11/06/2023   ANIONGAP 4 (L) 11/25/2023   ANIONGAP 2 (L) 11/12/2023    Lab Results  Component Value Date   WBC 8.80 11/25/2023   WBC 8.56 11/12/2023   WBC 7.14 11/11/2023   CREATININE 0.74 11/25/2023   CREATININE 0.59 (L) 11/12/2023   CREATININE 0.57 (L) 11/11/2023    Lab Results  Component Value Date   COLORU Yellow  06/01/2023   SPECGRAV 1.022 06/01/2023   GLUCOSEU Negative 06/01/2023   KETONEU Negative 06/01/2023   BLOODU Negative 06/01/2023   LEUKOUA Negative 06/01/2023   PHUR 5.5 06/01/2023   ALBUR 19 06/01/2023   BILIRUBINUR Negative 06/01/2023   UROBILINOGEN Normal 06/01/2023   RBCU 0-2 06/01/2023   WBCU 0-5 06/01/2023   BACTERIA None Seen 06/01/2023      Microbiology: No results found for this visit on 12/29/23 (from the past week).   Imaging ( personally reviewed by me):   No results found.  I personally reviewed patients records (including a review of problem list, current meds), labs, imaging and any consultant notes      Tawanna Sheen, MD [facility]-Infectious Disease- [facility] [phone]  This record has been created using Dragon voice recognition software.         [1]  Current Outpatient Medications:  .  acetaminophen  (TYLENOL ) 500 mg tablet, Take 1,000 mg by mouth every 6 (six) hours as needed for mild pain  (1-3)., Disp: , Rfl:  .  [Paused] apixaban  (ELIQUIS ) 5 mg tab, Take 1 tablet (5 mg total) by mouth 2 (two) times a day., Disp: 180 tablet, Rfl: 0 .  atorvastatin  (LIPITOR) 40 mg tablet, TAKE 1 TABLET(40 MG) BY MOUTH DAILY, Disp: 90 tablet, Rfl: 3 .  azithromycin  (ZITHROMAX ) 500 mg tablet, Take 1 tablet (500 mg total) by mouth 3 (three) times a week., Disp: , Rfl:  .  budesonide  (PULMICORT ) 0.5 mg/2 mL nebulizer solution, Use 1 vial in nebulizer every 12 hours., Disp: 120 mL, Rfl: 11 .  carvediloL  (COREG ) 6.25 mg tablet, Take 1 tablet (6.25 mg total) by mouth in the morning and 1 tablet (6.25 mg total) in the evening. Take with meals., Disp: 180 tablet, Rfl: 3 .  [Paused] clopidogreL  (PLAVIX ) 75 mg tablet, TAKE ONE TABLET BY MOUTH ONE TIME DAILY, Disp: 90 tablet, Rfl: 3 .  famotidine  (PEPCID ) 20 mg tablet, Take 1 tablet (20 mg total) by mouth 2 (two) times a day as needed for heartburn., Disp: 180 tablet, Rfl: 3 .  folic acid  (FOLVITE ) 1 mg tablet, TAKE 1 TABLET(1 MG) BY MOUTH DAILY, Disp: 90 tablet, Rfl: 1 .  formoterol  (PERFOROMIST ) 20 mcg/2 mL nebu nebulizer solution, 1 vial every 12 hours, Disp: 120 mL, Rfl: 3 .  gabapentin (NEURONTIN) 100 mg capsule, Take 2 capsules (200 mg total) by mouth 3 (three) times a day as needed (As needed for back pain)., Disp: 90 capsule, Rfl: 0 .  lidocaine (SALONPAS) 4 % patch, Apply 1 patch topically daily., Disp: 90 patch, Rfl: 0 .  lisinopriL  (PRINIVIL ) 5 mg tablet, Take 1 tablet (5 mg total) by mouth daily., Disp: 90 tablet, Rfl: 3 .  LORazepam  (ATIVAN ) 0.5 mg tablet, Take 1 tablet (0.5 mg total) by mouth daily as needed for anxiety., Disp: 30 tablet, Rfl: 0 .  nitroglycerin  (NITROSTAT ) 0.4 mg SL tablet, Place 1 tablet (0.4 mg total) under the tongue every 5 (five) minutes as needed for chest pain., Disp: 25 tablet, Rfl: 1 .  pantoprazole  (PROTONIX ) 40 mg EC tablet, Take 40 mg by mouth daily., Disp: , Rfl:  .  predniSONE  (DELTASONE ) 5 mg tablet, TAKE ONE TABLET  BY MOUTH EVERY OTHER DAY (ALTERNATE 5 MG AND 10 MG), Disp: 45 tablet, Rfl: 3 .  sennosides-docusate sodium  (PERICOLACE) 8.6-50 mg per tablet, Take 1 tablet by mouth at bedtime. (Patient not taking: Reported on 11/24/2023), Disp: 90 tablet, Rfl: 0 .  voriconazole (VFEND) 200  mg tablet, Take 1 tablet (200 mg total) by mouth 2 (two) times a day., Disp: 90 tablet, Rfl: 3 .  Yupelri  175 mcg/3 mL nebulizer solution, INHALE ONE VIAL VIA NEBULIZER ONE TIME DAILY, Disp: 90 mL, Rfl: 3 [2] Past Medical History: Diagnosis Date  . Anemia   . Consolidation lung   . COPD (chronic obstructive pulmonary disease) (CMD)   . Coronary artery disease   . History of prediabetes 02/01/2023  . Hyperlipidemia   . Hypertension    controlled with medications  . Impaired fasting blood sugar 07/14/2016   12/2017: -Chronic; mild prediabetes with a1c 5.7%.  . Lung consolidation 09/03/2018   Added automatically from request for surgery [id number]  . Microscopic hematuria 06/30/2019  . Mild renal insufficiency 07/20/2018   06/2018: Mild with GFR 71-80  12/2018: Stable mild renal decline with GFR 75-84. Associated with microalbuminuria. She takes ACEI  . Myocardial infarction    (CMD)   . Oxygen dependent    2L/min  . Pneumonia of right upper lobe due to infectious organism 07/20/2018  . Prediabetes 08/08/2021  [3] Past Surgical History: Procedure Laterality Date  . BRONCHOSCOPY Right 09/15/2018   Procedure: BRONCHOSCOPY;  Surgeon: Leo Pinion, MD;  Location: [facility] ENDO OR;  Service: Pulmonary;  Laterality: Right;  . CATARACT EXTRACTION W/  INTRAOCULAR LENS IMPLANT Right 02/25/2021   Procedure: PHACOEMULSIFICATION PC / IOL LEVEL 1;  Surgeon: Mod[place] Tonia Obey, MD;  Location: [facility] OUTPATIENT OR;  Service: Ophthalmology;  Laterality: Right;  Carvedilol , ERM, small pupil, h/o MI, hypoxemia, COPD-severe-on O2, CKD, narrow angles, possible iris expansion, Malyugin ring, Omidria, extra Viscoat  . COLPOSCOPY      Procedure: COLPOSCOPY  . CORONARY ANGIOPLASTY WITH STENT PLACEMENT  2006   Procedure: CORONARY ANGIOPLASTY WITH STENT PLACEMENT  [4] Family History Problem Relation Name Age of Onset  . Alzheimer's disease Mother    . Depression Mother    . Macular degeneration Mother    . Arthritis Father    . Heart disease Father    . Heart disease Brother    . Breast cancer Neg Hx    . Cancer Neg Hx    . Diabetes Neg Hx    . Glaucoma Neg Hx    . Retinal detachment Neg Hx    . Stroke Neg Hx    . Thyroid disease Neg Hx

## 2023-12-28 ENCOUNTER — Emergency Department (HOSPITAL_BASED_OUTPATIENT_CLINIC_OR_DEPARTMENT_OTHER)
Admission: EM | Admit: 2023-12-28 | Discharge: 2023-12-28 | Discharge status: Against Medical Advice | Attending: Emergency Medicine | Admitting: Emergency Medicine

## 2023-12-28 ENCOUNTER — Encounter (HOSPITAL_BASED_OUTPATIENT_CLINIC_OR_DEPARTMENT_OTHER): Payer: Self-pay | Admitting: Emergency Medicine

## 2023-12-28 ENCOUNTER — Other Ambulatory Visit: Payer: Self-pay

## 2023-12-28 DIAGNOSIS — M546 Pain in thoracic spine: Secondary | ICD-10-CM | POA: Insufficient documentation

## 2023-12-28 DIAGNOSIS — Z5321 Procedure and treatment not carried out due to patient leaving prior to being seen by health care provider: Secondary | ICD-10-CM | POA: Insufficient documentation

## 2023-12-28 NOTE — ED Triage Notes (Signed)
 Pt c/o upper back pain x 2 days. Denies falls.   Took tylenol  appx 1530.  Has lidocaine patch on in triage.

## 2023-12-29 NOTE — Progress Notes (Signed)
 [address] - AMBULATORY [facility]  - URGENT CARE [place] [address] [place] [address]  Date of Service: 12/28/2023 Patient DOB: [birth year]    History of Present Illness   Patient ID: Kristy Weeks is a 73 y.o. female. 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 Patient states that she has had back pain in her left upper back under her shoulder blade and the pain was getting better but got suddenly worse tonight. Patient took gabapentin and methocarbamol this morning, which usually helps, but today it did not. Patient took Tylenol  around 4pm as well with no improvement. ) .  Pt presents with upper back pain on the right side.  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  Also found to have a PE at that time.  Pt is on Plavix  for PE     History of Present Illness   Medical History[1]  Review of Systems   Review of Systems  Respiratory:  Positive for shortness of breath.   All other systems reviewed and are negative.   Physicial Exam   Physical Exam Vitals and nursing note reviewed.  Constitutional:      Appearance: Normal appearance.  Cardiovascular:     Rate and Rhythm: Normal rate.  Pulmonary:     Breath sounds: Rhonchi present.  Musculoskeletal:        General: Tenderness present.       Arms:  Neurological:     General: No focal deficit present.     Mental Status: She is alert.  Psychiatric:        Mood and Affect: Mood normal.     Vitals:   12/29/23 0007  BP: (!) 190/101  Pulse: 91  Resp: (!) 24  Temp: 96.5 F (35.8 C)  SpO2: 94%     Diagnosis   There are no diagnoses linked to this encounter.   Medical Decision Making  Pt presents with upper back pain on the right side.  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  Also found to have a PE at that time.  Pt is on Plavix  for PE. She  was diagnosed with mycobacterium avium-intracellulare in 2020.  She currently take gabapentin and robaxin for pain along with tylenol .  Her pain level is typically at a 4 most days, today she states it's a 10.    Her BP is elevated at 190/101  Her pulse ox here is 91% on 6L [place].  She got up to use the restroom and quickly dropped to 84%.    Solu-medrol  125mg  IM and Duoneb inhaler given  Pt is resistant at this time to go to the ER, but have explained to her that she may need intervention and evaluation unavailable here.  She would like to wait for the XR report and see if she improves with the solu-medrol   Independent interpretation of x-ray:  no changes from previous XR and CT  Cystic/ cavitary space versus loculated pneumothorax in the right upper lung field right hilar mass-like area of consolidation again noted.  Emphysema, no new focal consolidations.   Pt states she feels better after the breathing treatment.  Pain is better.  Pt has a follow up with ID tomorrow.  Pt will follow up with them and discuss any need for change in meds.    Pt stable for discharge at this time. With  pulse ox 95% on 6L [place]   Urgent Care Disposition:  Follow up with PCP  Patient cleared for discharge. Discussed return precautions. Instructed on supportive care measures - tylenol /motrin  for fever or discomfort, ensure adequate hydration, good hand hygiene to prevent spread of illness. Plan to follow up with PCP in 2-4 days if symptoms are not improving.   If showing URI symptoms, pt was examined in full PPE; mask, goggles, face shield, gown, and gloves.  Hands washed properly before and after encounter with patient.  Labs   No visits with results within 3 Day(s) from this visit.  Latest known visit with results is:  Lab on 11/25/2023  Component Date Value Ref Range Status  . Sodium 11/25/2023 140  136 - 145 mmol/L Final  . Potassium 11/25/2023 3.8  3.5 - 5.1 mmol/L Final   NO VISIBLE HEMOLYSIS  .  Chloride 11/25/2023 95 (L)  98 - 107 mmol/L Final  . CO2 11/25/2023 41 (HH)  21 - 31 mmol/L Final   High lactate dehydrogenase (LDH) concentrations in patient samples may cause falsely increased bicarbonate results. If markedly elevated LDH levels are suspected, please assess results in conjunction with patient's LDH values.  . Anion Gap 11/25/2023 4 (L)  6 - 14 mmol/L Final  . Glucose, Random 11/25/2023 115 (H)  70 - 99 mg/dL Final  . Blood Urea Nitrogen (BUN) 11/25/2023 16  7 - 25 mg/dL Final  . Creatinine 89/70/7974 0.74  0.60 - 1.20 mg/dL Final  . eGFR 89/70/7974 84  >59 mL/min/1.57m2 Final   GFR estimated by CKD-EPI equations(NKF 2021).   Recommend confirmation of Cr-based eGFR by using Cys-based eGFR and other filtration markers (if applicable) in complex cases and clinical decision-making, as needed.  . Albumin 11/25/2023 3.9  3.5 - 5.7 g/dL Final  . Total Protein 11/25/2023 6.0 (L)  6.4 - 8.9 g/dL Final  . Bilirubin, Total 11/25/2023 0.3  0.3 - 1.0 mg/dL Final  . Alkaline Phosphatase (ALP) 11/25/2023 82  34 - 104 U/L Final  . Aspartate Aminotransferase (AST) 11/25/2023 12 (L)  13 - 39 U/L Final  . Alanine Aminotransferase (ALT) 11/25/2023 6 (L)  7 - 52 U/L Final  . Calcium  11/25/2023 9.1  8.6 - 10.3 mg/dL Final  . BUN/Creatinine Ratio 11/25/2023    Final   Creatinine is normal, ratio is not clinically indicated.   . Voriconazole Level (Serum) 11/25/2023 1.9  1.0 - 5.5 ug/mL Final   Comment: Steady state trough concentrations are achieved after 5 days of oral or IV therapy. Non-steady-state concentrations may be indicated in selected situations. Consult ID or CAUSE for specific recommendations about dosing and monitoring strategies.  Serum trough levels <1 mcg/mL have been associated with suboptimal therapeutic response and serum trough levels >5.5 mcg/mL have been reported to be associated with reversible neurological adverse events and hepatotoxicity.  Voriconazole metabolism may  be altered by co-administration of drugs that metabolically induce or inhibit CYP2C19 or by genetic polymorphisms that affect enzyme activity  This is a lab developed test (LDT) and its performance characteristics were determined by the [facility] in accordance with CAP and CLIA guidelines. This test has not been cleared nor approved by the U.S. Food and Drug Administration. The [facility] is certified under the Clinical Laboratory                           Improvement Amendments of 1988 (CLIA '88)  as qualified to perform high complexity clinical laboratory testing, such as LDT's.   . WBC 11/25/2023 8.80  4.40 - 11.00 10*3/uL Final  . RBC 11/25/2023 3.42 (L)  4.10 - 5.10 10*6/uL Final  . Hemoglobin 11/25/2023 10.5 (L)  12.3 - 15.3 g/dL Final  . Hematocrit 89/70/7974 31.9 (L)  35.9 - 44.6 % Final  . Mean Corpuscular Volume (MCV) 11/25/2023 93.3  80.0 - 96.0 fL Final  . Mean Corpuscular Hemoglobin (MCH) 11/25/2023 30.6  27.5 - 33.2 pg Final  . Mean Corpuscular Hemoglobin Conc (* 11/25/2023 32.8 (L)  33.0 - 37.0 g/dL Final  . Red Cell Distribution Width (RDW) 11/25/2023 13.3  12.3 - 17.0 % Final  . Platelet Count (PLT) 11/25/2023 265  150 - 450 10*3/uL Final  . Mean Platelet Volume (MPV) 11/25/2023 7.2  6.8 - 10.2 fL Final  . Neutrophils % 11/25/2023 68  % Final  . Lymphocytes % 11/25/2023 21  % Final  . Monocytes % 11/25/2023 10  % Final  . Eosinophils % 11/25/2023 1  % Final  . Basophils % 11/25/2023 1  % Final  . nRBC % 11/25/2023 0  % Final  . Neutrophils Absolute 11/25/2023 5.90  1.80 - 7.80 10*3/uL Final  . Lymphocytes # 11/25/2023 1.80  1.00 - 4.80 10*3/uL Final  . Monocytes # 11/25/2023 0.90 (H)  0.00 - 0.80 10*3/uL Final  . Eosinophils # 11/25/2023 0.10  0.00 - 0.50 10*3/uL Final  . Basophils # 11/25/2023 0.10  0.00 - 0.20 10*3/uL Final  . nRBC Absolute 11/25/2023 0.00  <=0.00 10*3/uL Final    Imaging   No results found  for this or any previous visit (from the past 750 hours).  Discharge Information   Symptomatic management discussed.  Patient was given verbal and/or written instructions on symptoms that necessitate return to the UC/ED, and instructed to f/u w/ UC or PCP if not improving in expected timeframe.  If you have labwork/additional testing from your visit today that has been sent to the lab but is still pending, please sign up for access to the [facility] app/site below if you have not already to facilitate access to your own lab results.   [email or web address]  Lab results/follow-up:  -A member of our staff will call for any ABNORMAL results or results that warrant changing your plan of care. We DO NOT call back NEGATIVE OR NORMAL results, but you can check/review them yourself via [facility] in real time. -We will generally have most results to patients within 3 days - certain testing such as cultures or send-out/specialized testing may take slightly longer.  -Please be patient in the interim and only call the clinic for emergent questions regarding your testing and results.  You are always welcome to call if you need any specific guidance regarding your treatment/plan, but we cannot expedite non-emergent testing/results.  -Critical values/results that often require immediate intervention are routed directly to medical providers rather than nursing - you should hear about these more quickly. -If we have advised you that we are sending any STAT or emergent labs, please ensure we have the correct contact info for you and keep your phone on, answer any incoming calls in the interim.   Referrals to primary care or specialists:  -If we have referred you to establish care with a primary care provider or to a specialist, our schedulers should be contacting you within a few days to begin the scheduling process - please be patient and allow them 2-3 business days to  process  the referral. If you have any questions or do not hear from them in a timely fashion, you can call to follow up yourself at [phone] ([phone]) or call directly to the department you are being referred to and they will be able to assist you.    If you have any worsening symptoms/concerns in the meantime - please get rechecked in the Urgent Care, by your primary care provider or go to the ER if emergently so.  Patient/parent has been instructed on RX/OTC medications, dosages, side effects, and possible interactions as associated with each diagnosis in my impression and plan above.   Patient education (verbal/handout) given on diagnosis, pathophysiology, treatment of diagnosis, side effects of medication use for treatment, restrictions while taking medication, supportives measures such as staying hydrated. No AVS printed IF Pt opted to receive AVS online via [facility] app and/or [email or web address].    Red Flags associated with diagnosis/es were reviewed and patient instructed on action plan if red flags develop.   They have been instructed that if symptoms worsen or red flags develop they should return to Urgent Care, go to the nearest ED, or activate EMS/911.     Patient and/or parent/guardian (if applicable) agreed with plan and voiced understanding.  No barriers to adherence perceived by myself.  If a new prescription was given today, then I discussed potential side effects, drug interactions, instructions for taking the medication, and the consequences of not taking it.    F/u: Follow up closely with primary care provider (PCP) and other specialists for further care and routine care, but seek medical attention sooner if worsening/concerning signs or symptoms.     Electronically signed by: Arlyne Almarie Albino, PA-C 12/29/2023 12:20 AM       [1] Past Medical History: Diagnosis Date  . Anemia   . Consolidation lung   . COPD (chronic obstructive pulmonary disease) (CMD)    . Coronary artery disease   . History of prediabetes 02/01/2023  . Hyperlipidemia   . Hypertension    controlled with medications  . Impaired fasting blood sugar 07/14/2016   12/2017: -Chronic; mild prediabetes with a1c 5.7%.  . Lung consolidation 09/03/2018   Added automatically from request for surgery [id number]  . Microscopic hematuria 06/30/2019  . Mild renal insufficiency 07/20/2018   06/2018: Mild with GFR 71-80  12/2018: Stable mild renal decline with GFR 75-84. Associated with microalbuminuria. She takes ACEI  . Myocardial infarction    (CMD)   . Oxygen dependent    2L/min  . Pneumonia of right upper lobe due to infectious organism 07/20/2018  . Prediabetes 08/08/2021

## 2023-12-31 ENCOUNTER — Emergency Department (HOSPITAL_BASED_OUTPATIENT_CLINIC_OR_DEPARTMENT_OTHER)
Admission: EM | Admit: 2023-12-31 | Discharge: 2023-12-31 | Disposition: A | Discharge status: Home / Self Care | Attending: Emergency Medicine | Admitting: Emergency Medicine

## 2023-12-31 ENCOUNTER — Encounter (HOSPITAL_BASED_OUTPATIENT_CLINIC_OR_DEPARTMENT_OTHER): Payer: Self-pay

## 2023-12-31 ENCOUNTER — Emergency Department (HOSPITAL_BASED_OUTPATIENT_CLINIC_OR_DEPARTMENT_OTHER)

## 2023-12-31 ENCOUNTER — Other Ambulatory Visit: Payer: Self-pay

## 2023-12-31 DIAGNOSIS — M549 Dorsalgia, unspecified: Secondary | ICD-10-CM | POA: Diagnosis present

## 2023-12-31 DIAGNOSIS — S22050A Wedge compression fracture of T5-T6 vertebra, initial encounter for closed fracture: Secondary | ICD-10-CM

## 2023-12-31 DIAGNOSIS — X58XXXA Exposure to other specified factors, initial encounter: Secondary | ICD-10-CM | POA: Insufficient documentation

## 2023-12-31 DIAGNOSIS — Z7951 Long term (current) use of inhaled steroids: Secondary | ICD-10-CM | POA: Insufficient documentation

## 2023-12-31 DIAGNOSIS — Z7902 Long term (current) use of antithrombotics/antiplatelets: Secondary | ICD-10-CM | POA: Insufficient documentation

## 2023-12-31 DIAGNOSIS — Z7901 Long term (current) use of anticoagulants: Secondary | ICD-10-CM | POA: Insufficient documentation

## 2023-12-31 DIAGNOSIS — S22058A Other fracture of T5-T6 vertebra, initial encounter for closed fracture: Secondary | ICD-10-CM | POA: Insufficient documentation

## 2023-12-31 DIAGNOSIS — J449 Chronic obstructive pulmonary disease, unspecified: Secondary | ICD-10-CM | POA: Insufficient documentation

## 2023-12-31 LAB — CBC WITH DIFFERENTIAL/PLATELET
Abs Immature Granulocytes: 0.03 K/uL (ref 0.00–0.07)
Basophils Absolute: 0 K/uL (ref 0.0–0.1)
Basophils Relative: 0 %
Eosinophils Absolute: 0 K/uL (ref 0.0–0.5)
Eosinophils Relative: 0 %
HCT: 33.1 % — ABNORMAL LOW (ref 36.0–46.0)
Hemoglobin: 9.9 g/dL — ABNORMAL LOW (ref 12.0–15.0)
Immature Granulocytes: 0 %
Lymphocytes Relative: 10 %
Lymphs Abs: 0.8 K/uL (ref 0.7–4.0)
MCH: 28.7 pg (ref 26.0–34.0)
MCHC: 29.9 g/dL — ABNORMAL LOW (ref 30.0–36.0)
MCV: 95.9 fL (ref 80.0–100.0)
Monocytes Absolute: 0.4 K/uL (ref 0.1–1.0)
Monocytes Relative: 6 %
Neutro Abs: 6.4 K/uL (ref 1.7–7.7)
Neutrophils Relative %: 84 %
Platelets: 270 K/uL (ref 150–400)
RBC: 3.45 MIL/uL — ABNORMAL LOW (ref 3.87–5.11)
RDW: 12.8 % (ref 11.5–15.5)
WBC: 7.7 K/uL (ref 4.0–10.5)
nRBC: 0 % (ref 0.0–0.2)

## 2023-12-31 LAB — BASIC METABOLIC PANEL WITH GFR
Anion gap: 8 (ref 5–15)
BUN: 22 mg/dL (ref 8–23)
CO2: 37 mmol/L — ABNORMAL HIGH (ref 22–32)
Calcium: 9.6 mg/dL (ref 8.9–10.3)
Chloride: 97 mmol/L — ABNORMAL LOW (ref 98–111)
Creatinine, Ser: 0.63 mg/dL (ref 0.44–1.00)
GFR, Estimated: >60 mL/min (ref >60)
Glucose, Bld: 145 mg/dL — ABNORMAL HIGH (ref 70–99)
Potassium: 4.7 mmol/L (ref 3.5–5.1)
Sodium: 142 mmol/L (ref 135–145)

## 2023-12-31 MED ORDER — IOHEXOL 350 MG/ML SOLN
70.0000 mL | Freq: Once | INTRAVENOUS | Status: AC | PRN
  Administered 2023-12-31: 70 mL via INTRAVENOUS

## 2023-12-31 MED ORDER — IPRATROPIUM-ALBUTEROL 0.5-2.5 (3) MG/3ML IN SOLN: 3.0000 mL | RESPIRATORY_TRACT | Status: DC

## 2023-12-31 MED ORDER — IPRATROPIUM-ALBUTEROL 0.5-2.5 (3) MG/3ML IN SOLN: 3.0000 mL | Freq: Once | RESPIRATORY_TRACT | Status: AC

## 2023-12-31 MED ORDER — ONDANSETRON HCL 4 MG/2ML IJ SOLN
4.0000 mg | Freq: Once | INTRAMUSCULAR | Status: AC
  Administered 2023-12-31: 4 mg via INTRAVENOUS
  Filled 2023-12-31: qty 2

## 2023-12-31 MED ORDER — IPRATROPIUM-ALBUTEROL 0.5-2.5 (3) MG/3ML IN SOLN
RESPIRATORY_TRACT | Status: AC
  Administered 2023-12-31: 3 mL via RESPIRATORY_TRACT
  Filled 2023-12-31: qty 3

## 2023-12-31 MED ORDER — FENTANYL CITRATE (PF) 50 MCG/ML IJ SOSY
25.0000 ug | PREFILLED_SYRINGE | Freq: Once | INTRAMUSCULAR | Status: AC
  Administered 2023-12-31: 25 ug via INTRAVENOUS
  Filled 2023-12-31: qty 1

## 2023-12-31 MED ORDER — ONDANSETRON 4 MG PO TBDP: 4.0000 mg | ORAL_TABLET | Freq: Three times a day (TID) | ORAL | 0 refills | Status: AC | PRN

## 2023-12-31 MED ORDER — TRAMADOL HCL 50 MG PO TABS: 50.0000 mg | ORAL_TABLET | Freq: Two times a day (BID) | ORAL | 0 refills | Status: AC | PRN

## 2023-12-31 NOTE — ED Provider Notes (Signed)
  EMERGENCY DEPARTMENT AT [facility] Provider Note   CSN: [id number] Arrival date & time: 12/31/23  8057     Patient presents with: Back Pain   Kristy Weeks is a 73 y.o. female presenting to ED with complaint of right-sided chest pain and back pain.  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  She is on Eliquis , and Plavix  for her PE.  She is also on voriconazole through infectious disease at [facility] for her fungal infection.  She reports she takes typically Tylenol  and gabapentin for pain which often helped, but a few days ago became more severe and unrelenting.  She went to see her infectious the specialist 2 days ago in the office and had blood work testing done.  She was told she may need a repeat CT scan of her symptoms did not improve.  Her son brought her in tonight because her pain was getting worse.  She wears 4 to 6 L nasal cannula at baseline for her emphysema and COPD.   HPI     Prior to Admission medications   Medication Sig Start Date End Date Taking? Authorizing Provider  ondansetron  (ZOFRAN -ODT) 4 MG disintegrating tablet Take 1 tablet (4 mg total) by mouth every 8 (eight) hours as needed for up to 12 doses for nausea or vomiting. 12/31/23  Yes Vennie Salsbury, Donnice PARAS, MD  traMADol  (ULTRAM ) 50 MG tablet Take 1 tablet (50 mg total) by mouth every 12 (twelve) hours as needed for up to 10 doses for severe pain (pain score 7-10). 12/31/23  Yes Benay Pomeroy, Donnice PARAS, MD  acetaminophen  (TYLENOL ) 500 MG tablet Take 1,000 mg by mouth every 6 (six) hours as needed for moderate pain (pain score 4-6) or mild pain (pain score 1-3).    [provider]  albuterol  (VENTOLIN  HFA) 108 (90 Base) MCG/ACT inhaler Inhale 2 puffs into the lungs every 4 (four) hours as needed for wheezing or shortness of breath. 04/12/23   [provider]  ALPRAZolam  (XANAX ) 0.5 MG tablet Take 0.25 mg by mouth 2 (two) times daily as needed for sleep or anxiety. 07/07/23   [provider]  apixaban  (ELIQUIS ) 5 MG TABS tablet Take 2 tablets (10 mg total) by mouth 2 (two) times daily for 4 days, THEN 1 tablet (5 mg total) 2 (two) times daily. 10/20/23 11/23/23  Regalado, Belkys A, MD  atorvastatin  (LIPITOR) 40 MG tablet Take 40 mg by mouth every evening.    [provider]  azithromycin  (ZITHROMAX ) 500 MG tablet Take 500 mg by mouth every Monday, Wednesday, and Friday.    [provider]  benzonatate (TESSALON) 100 MG capsule Take 100 mg by mouth 3 (three) times daily as needed for cough. 06/13/23   [provider]  budesonide  (PULMICORT ) 0.5 MG/2ML nebulizer solution Take 0.5 mg by nebulization every 12 (twelve) hours.    [provider]  carvedilol  (COREG ) 6.25 MG tablet Take 1 tablet (6.25 mg total) by mouth 2 (two) times daily with a meal. 10/20/23   Regalado, Belkys A, MD  clopidogrel  (PLAVIX ) 75 MG tablet Take 75 mg by mouth in the morning.    [provider]  famotidine  (PEPCID ) 20 MG tablet Take 20 mg by mouth 2 (two) times daily as needed for heartburn. 06/01/23   [provider]  folic acid  (FOLVITE ) 1 MG tablet Take  1 mg by mouth daily. 03/13/22   [provider]  formoterol  (PERFOROMIST ) 20 MCG/2ML nebulizer solution Take 20 mcg by nebulization 2 (two) times daily.    [provider]  guaiFENesin  (MUCINEX ) 600 MG 12 hr tablet Take 2 tablets (1,200 mg total) by mouth 2 (two) times daily. 10/20/23   Regalado, Belkys A, MD  ipratropium-albuterol  (DUONEB) 0.5-2.5 (3) MG/3ML SOLN Take 3 mLs by nebulization See admin instructions. Nebulize 3 ml's and inhale into the lungs every 5 hours    [provider]  lisinopril  (ZESTRIL ) 5 MG tablet Take 5 mg by mouth every evening.    [provider]  nitroGLYCERIN  (NITROSTAT ) 0.4 MG SL tablet Place 0.4 mg  under the tongue every 5 (five) minutes as needed for chest pain. 04/23/23   [provider]  OXYGEN Inhale 4 L/min into the lungs continuous.    [provider]  pantoprazole  (PROTONIX ) 40 MG tablet Take 1 tablet (40 mg total) by mouth daily. 10/20/23 10/19/24  Regalado, Belkys A, MD  polyethylene glycol (MIRALAX  / GLYCOLAX ) 17 g packet Take 17 g by mouth daily. 10/21/23   Regalado, Belkys A, MD  YUPELRI  175 MCG/3ML nebulizer solution Take 175 mcg by nebulization in the morning.    [provider]    Allergies: Cefdinir and Hydrocodone -acetaminophen     Review of Systems  Updated Vital Signs BP (!) 175/94 (BP Location: Right Arm)   Pulse 81   Temp 98.4 F (36.9 C) (Oral)   Resp 18   Ht 5' 4 (1.626 m)   Wt 49.9 kg   SpO2 97%   BMI 18.88 kg/m   Physical Exam Constitutional:      General: She is not in acute distress.    Comments: Thin, frail  HENT:     Head: Normocephalic and atraumatic.  Eyes:     Conjunctiva/sclera: Conjunctivae normal.     Pupils: Pupils are equal, round, and reactive to light.  Cardiovascular:     Rate and Rhythm: Normal rate and regular rhythm.  Pulmonary:     Effort: Pulmonary effort is normal. No respiratory distress.     Comments: 97% on 6 L nasal cannula, mild end expiratory wheezing, speaking full sentences, diminished breath sounds in the right upper lobe Abdominal:     General: There is no distension.     Tenderness: There is no abdominal tenderness.  Musculoskeletal:     Comments: Right scapular tenderness on exam  Skin:    General: Skin is warm and dry.  Neurological:     General: No focal deficit present.     Mental Status: She is alert. Mental status is at baseline.  Psychiatric:        Mood and Affect: Mood normal.        Behavior: Behavior normal.     (all labs ordered are listed, but only abnormal results are displayed) Labs Reviewed  BASIC METABOLIC PANEL WITH GFR - Abnormal; Notable for the following  components:      Result Value   Chloride 97 (*)    CO2 37 (*)    Glucose, Bld 145 (*)    All other components within normal limits  CBC WITH DIFFERENTIAL/PLATELET - Abnormal; Notable for the following components:   RBC 3.45 (*)    Hemoglobin 9.9 (*)    HCT 33.1 (*)    MCHC 29.9 (*)    All other components within normal limits    EKG: None  Radiology: CT Angio Chest PE  W and/or Wo Contrast Result Date: 12/31/2023 CLINICAL DATA:  Provided history: Pulmonary embolism (PE) suspected, high prob hx of right sided PE and aspergilloma right lung, here with acute worsening right sided pleuritic chest pain EXAM: CT ANGIOGRAPHY CHEST WITH CONTRAST TECHNIQUE: Multidetector CT imaging of the chest was performed using the standard protocol during bolus administration of intravenous contrast. Multiplanar CT image reconstructions and MIPs were obtained to evaluate the vascular anatomy. RADIATION DOSE REDUCTION: This exam was performed according to the departmental dose-optimization program which includes automated exposure control, adjustment of the mA and/or kV according to patient size and/or use of iterative reconstruction technique. CONTRAST:  70mL OMNIPAQUE  IOHEXOL  350 MG/ML SOLN COMPARISON:  Radiograph 10/18/2023.  Most recent chest CT 10/13/2023 FINDINGS: Cardiovascular: No acute pulmonary embolus. Query small volume of chronic thrombus in the right lower lobe, series 10, image 211, improved from prior exam. The right upper and middle lobe pulmonary arteries again noted to be truncated. The central pulmonary arteries are dilated. The heart is enlarged. There are coronary artery calcifications. Aortic atherosclerosis. The ascending aorta is normal in caliber. The descending aorta is tortuous. Stable descending aneurysmal dilatation at 3.7 cm. No contrast in the transverse or descending aorta. No significant pericardial effusion. Mediastinum/Nodes: No mediastinal lymphadenopathy. No obvious bulky hilar lymph  nodes. Patulous esophagus without wall thickening. Lungs/Pleura: Again seen volume loss in the right hemithorax. Redemonstrated chronic cavitary process in the right lung apex with peripheral nodularity. Trace fluid in the inferior aspect of the cavity which has improved from prior exam. No evidence of fungal ball. Otherwise advanced emphysema. Areas of mucoid impaction and mucoid filling in the right lower lobe with chronic right basilar opacity, partially calcified and partially nodular. Nodular areas in the right middle lobe are stable from prior exam. Lesser areas of mucoid impaction in the left lower lobe. No acute airspace disease. Mild right pleural thickening without significant effusion. Upper Abdomen: Right renal atrophy is partially included. 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 No acute upper abdominal findings. Musculoskeletal: Exaggerated thoracic kyphosis. New T6 compression fracture with approximately 50% loss of height. No suspicious bone lesion. Review of the MIP images confirms the above findings. IMPRESSION: 1. No acute pulmonary embolus. Query small volume of chronic thrombus in the right lower lobe, improved from prior exam. 2. Stable chronic findings in the right lung. Redemonstrated cavitary process in the lung upper lobe and scarring throughout the right hemithorax. 3. Advanced emphysema. 4. New T6 compression fracture with approximately 50% loss of height. 5. 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 Aortic Atherosclerosis (ICD10-I70.0) and Emphysema (ICD10-J43.9). Electronically Signed   By: Andrea Gasman M.D.   On: 12/31/2023 21:35     Procedures   Medications Ordered in the ED  ipratropium-albuterol  (DUONEB) 0.5-2.5 (3) MG/3ML nebulizer solution 3 mL (has no administration in time range)  ondansetron  (ZOFRAN ) injection 4 mg (has no administration in time range)  fentaNYL  (SUBLIMAZE ) injection 25 mcg (25 mcg  Intravenous Given 12/31/23 2048)  iohexol  (OMNIPAQUE ) 350 MG/ML injection 70 mL (70 mLs Intravenous Contrast Given 12/31/23 2058)  ipratropium-albuterol  (DUONEB) 0.5-2.5 (3) MG/3ML nebulizer solution 3 mL (3 mLs Nebulization Given 12/31/23 2122)                                    Medical Decision Making Amount and/or Complexity of Data Reviewed Labs: ordered. Radiology: ordered.  Risk Prescription  drug management.   This patient presents to the ED with concern for right-sided chest pain and back pain. This involves an extensive number of treatment options, and is a complaint that carries with it a high risk of complications and morbidity.  The differential diagnosis includes occult fracture versus musculoskeletal pain versus recurrent or new infection versus pneumothorax versus new pulmonary embolism versus other  Co-morbidities that complicate the patient evaluation: History of fungal infection, history of PE, risk of recurrence  Additional history obtained from family member at bedside  External records from outside source obtained and reviewed including infectious disease evaluation of the office 2 days ago, as well as lab work done in the office 2 days ago at [facility].  CBC 4.4 hgb 10.5 at the time.  Creatinine normal.  I ordered and personally interpreted labs.  The pertinent results include: No emergent findings  I ordered imaging studies including CT PE study I independently visualized and interpreted imaging which showed acute T6 thoracic compression fracture, remainder of the CT findings appear stable from prior exams I agree with the radiologist interpretation  I ordered medication including IV fentanyl for pain  I have reviewed the patients home medicines and have made adjustments as needed   After the interventions noted above, I reevaluated the patient and found that they have: improved  I think her pain is referred from her thoracic compression fracture it is  reproducible on exam.  This is not amenable to a TLSO brace, given its upper location as well as her thin habitus and ability to tolerate a brace.  Likely this is related to osteoporosis, with her frequent steroid use and advanced age and thin habitus.  But she and her son are comfortable going home with this likely new diagnosis.  The fentanyl did help her in the ED.  She generally gets queasy with opioids at home but we discussed a short course of tramadol , to use sparingly at home, with Zofran .  Otherwise she cannot use NSAIDs.  She has already been taking gabapentin and Tylenol .  Dispostion:  After consideration of the diagnostic results and the patients response to treatment, I feel that the patent would benefit from outpatient follow-up     Final diagnoses:  Compression fracture of T6 vertebra, initial encounter [facility])    ED Discharge Orders          Ordered    traMADol  (ULTRAM ) 50 MG tablet  Every 12 hours PRN        12/31/23 2151    ondansetron  (ZOFRAN -ODT) 4 MG disintegrating tablet  Every 8 hours PRN        12/31/23 2151               Cottie Donnice PARAS, MD 12/31/23 2153

## 2023-12-31 NOTE — Discharge Instructions (Signed)
 Your CT scan showed that your lungs appear stable, with no changes to your fungal infection or signs of new blood clots.  However, your CT scan showed that you have a compression fracture or broken bone in your thoracic spine.  This is likely causing the pain that you are feeling in your back and shoulder.  I did prescribe you a short course of tramadol , which is an opioid pain medicine.  This medicine can make you sleepy, nauseous, sick, or drowsy.  Do not mix this with Ativan  or any other sedating medicines, do not mix with alcohol.  Please follow-up with your primary care doctor for further discussion about pain control.

## 2023-12-31 NOTE — ED Triage Notes (Signed)
 Pt complaining of upper right back pain that has been going on for the last 2-3 days. Was diagnosed with a fungal infection in the lungs that she is on a antifungal for. Said she has had to increase her oxygen supplement from 4 ltrs to 6 ltrs in the last 2 days as well.

## 2023-12-31 NOTE — ED Notes (Signed)
 Patient transferred from waiting room to ED treatment room. Assuming pt care at this time.

## 2024-02-07 ENCOUNTER — Emergency Department (HOSPITAL_BASED_OUTPATIENT_CLINIC_OR_DEPARTMENT_OTHER)
Admission: EM | Admit: 2024-02-07 | Discharge: 2024-02-08 | Disposition: A | Attending: Emergency Medicine | Admitting: Emergency Medicine

## 2024-02-07 ENCOUNTER — Other Ambulatory Visit: Payer: Self-pay

## 2024-02-07 ENCOUNTER — Emergency Department (HOSPITAL_BASED_OUTPATIENT_CLINIC_OR_DEPARTMENT_OTHER)

## 2024-02-07 ENCOUNTER — Encounter (HOSPITAL_BASED_OUTPATIENT_CLINIC_OR_DEPARTMENT_OTHER): Payer: Self-pay

## 2024-02-07 DIAGNOSIS — J449 Chronic obstructive pulmonary disease, unspecified: Secondary | ICD-10-CM | POA: Diagnosis not present

## 2024-02-07 DIAGNOSIS — Z7902 Long term (current) use of antithrombotics/antiplatelets: Secondary | ICD-10-CM | POA: Insufficient documentation

## 2024-02-07 DIAGNOSIS — M546 Pain in thoracic spine: Secondary | ICD-10-CM | POA: Insufficient documentation

## 2024-02-07 DIAGNOSIS — J441 Chronic obstructive pulmonary disease with (acute) exacerbation: Secondary | ICD-10-CM

## 2024-02-07 DIAGNOSIS — Z7951 Long term (current) use of inhaled steroids: Secondary | ICD-10-CM | POA: Diagnosis not present

## 2024-02-07 DIAGNOSIS — G8929 Other chronic pain: Secondary | ICD-10-CM

## 2024-02-07 DIAGNOSIS — R0602 Shortness of breath: Secondary | ICD-10-CM | POA: Diagnosis present

## 2024-02-07 DIAGNOSIS — Z7901 Long term (current) use of anticoagulants: Secondary | ICD-10-CM | POA: Insufficient documentation

## 2024-02-07 DIAGNOSIS — R062 Wheezing: Secondary | ICD-10-CM | POA: Diagnosis not present

## 2024-02-07 LAB — COMPREHENSIVE METABOLIC PANEL WITH GFR
ALT: 6 U/L (ref 0–44)
AST: 16 U/L (ref 15–41)
Albumin: 4.1 g/dL (ref 3.5–5.0)
Alkaline Phosphatase: 167 U/L — ABNORMAL HIGH (ref 38–126)
Anion gap: 7 (ref 5–15)
BUN: 17 mg/dL (ref 8–23)
CO2: 34 mmol/L — ABNORMAL HIGH (ref 22–32)
Calcium: 9.1 mg/dL (ref 8.9–10.3)
Chloride: 99 mmol/L (ref 98–111)
Creatinine, Ser: 0.49 mg/dL (ref 0.44–1.00)
GFR, Estimated: >60 mL/min
Glucose, Bld: 121 mg/dL — ABNORMAL HIGH (ref 70–99)
Potassium: 3.9 mmol/L (ref 3.5–5.1)
Sodium: 140 mmol/L (ref 135–145)
Total Bilirubin: 0.2 mg/dL (ref 0.0–1.2)
Total Protein: 6.2 g/dL — ABNORMAL LOW (ref 6.5–8.1)

## 2024-02-07 LAB — I-STAT VENOUS BLOOD GAS, ED
Acid-Base Excess: 12 mmol/L — ABNORMAL HIGH (ref 0.0–2.0)
Bicarbonate: 38 mmol/L — ABNORMAL HIGH (ref 20.0–28.0)
Calcium, Ion: 1.18 mmol/L (ref 1.15–1.40)
HCT: 31 % — ABNORMAL LOW (ref 36.0–46.0)
Hemoglobin: 10.5 g/dL — ABNORMAL LOW (ref 12.0–15.0)
O2 Saturation: 94 %
Patient temperature: 98.5
Potassium: 3.9 mmol/L (ref 3.5–5.1)
Sodium: 139 mmol/L (ref 135–145)
TCO2: 40 mmol/L — ABNORMAL HIGH (ref 22–32)
pCO2, Ven: 55.1 mmHg (ref 44–60)
pH, Ven: 7.446 — ABNORMAL HIGH (ref 7.25–7.43)
pO2, Ven: 70 mmHg — ABNORMAL HIGH (ref 32–45)

## 2024-02-07 LAB — CBC WITH DIFFERENTIAL/PLATELET
Abs Immature Granulocytes: 0.04 K/uL (ref 0.00–0.07)
Basophils Absolute: 0 K/uL (ref 0.0–0.1)
Basophils Relative: 0 %
Eosinophils Absolute: 0 K/uL (ref 0.0–0.5)
Eosinophils Relative: 0 %
HCT: 29.4 % — ABNORMAL LOW (ref 36.0–46.0)
Hemoglobin: 8.6 g/dL — ABNORMAL LOW (ref 12.0–15.0)
Immature Granulocytes: 1 %
Lymphocytes Relative: 16 %
Lymphs Abs: 1.4 K/uL (ref 0.7–4.0)
MCH: 27.7 pg (ref 26.0–34.0)
MCHC: 29.3 g/dL — ABNORMAL LOW (ref 30.0–36.0)
MCV: 94.5 fL (ref 80.0–100.0)
Monocytes Absolute: 0.9 K/uL (ref 0.1–1.0)
Monocytes Relative: 11 %
Neutro Abs: 6.2 K/uL (ref 1.7–7.7)
Neutrophils Relative %: 72 %
Platelets: 320 K/uL (ref 150–400)
RBC: 3.11 MIL/uL — ABNORMAL LOW (ref 3.87–5.11)
RDW: 13.5 % (ref 11.5–15.5)
WBC: 8.6 K/uL (ref 4.0–10.5)
nRBC: 0 % (ref 0.0–0.2)

## 2024-02-07 LAB — RESP PANEL BY RT-PCR (RSV, FLU A&B, COVID)  RVPGX2
Influenza A by PCR: NEGATIVE
Influenza B by PCR: NEGATIVE
Resp Syncytial Virus by PCR: NEGATIVE
SARS Coronavirus 2 by RT PCR: NEGATIVE

## 2024-02-07 MED ORDER — METHYLPREDNISOLONE SODIUM SUCC 125 MG IJ SOLR
125.0000 mg | Freq: Once | INTRAMUSCULAR | Status: AC
  Administered 2024-02-07: 125 mg via INTRAVENOUS
  Filled 2024-02-07: qty 2

## 2024-02-07 MED ORDER — MAGNESIUM SULFATE 2 GM/50ML IV SOLN
2.0000 g | Freq: Once | INTRAVENOUS | Status: AC
  Administered 2024-02-07: 2 g via INTRAVENOUS
  Filled 2024-02-07: qty 50

## 2024-02-07 MED ORDER — ALBUTEROL SULFATE (2.5 MG/3ML) 0.083% IN NEBU
5.0000 mg | INHALATION_SOLUTION | Freq: Once | RESPIRATORY_TRACT | Status: AC
  Administered 2024-02-07: 5 mg via RESPIRATORY_TRACT
  Filled 2024-02-07: qty 6

## 2024-02-07 MED ORDER — ALBUTEROL (5 MG/ML) CONTINUOUS INHALATION SOLN: 10.0000 mg/h | INHALATION_SOLUTION | Freq: Once | RESPIRATORY_TRACT | Status: DC

## 2024-02-07 MED ORDER — ALBUTEROL SULFATE (2.5 MG/3ML) 0.083% IN NEBU
INHALATION_SOLUTION | RESPIRATORY_TRACT | Status: AC
  Administered 2024-02-08: 10 mg
  Filled 2024-02-07: qty 12

## 2024-02-07 MED ORDER — FENTANYL CITRATE (PF) 50 MCG/ML IJ SOSY
50.0000 ug | PREFILLED_SYRINGE | Freq: Once | INTRAMUSCULAR | Status: AC
  Administered 2024-02-07: 50 ug via INTRAVENOUS
  Filled 2024-02-07: qty 1

## 2024-02-07 MED ORDER — IPRATROPIUM BROMIDE 0.02 % IN SOLN
0.5000 mg | Freq: Once | RESPIRATORY_TRACT | Status: AC
  Administered 2024-02-07: 0.5 mg via RESPIRATORY_TRACT
  Filled 2024-02-07: qty 2.5

## 2024-02-07 NOTE — ED Provider Notes (Signed)
 " [place] EMERGENCY DEPARTMENT AT [facility] Provider Note   CSN: [id number] Arrival date & time: 02/07/24  2154     Patient presents with: Shortness of Breath   Kristy Weeks is a 74 y.o. female history of COPD on 4 L, here presenting with shortness of breath and back pain.  Patient had a thoracic compression fracture about a month ago.  Patient was put on pain medicine but has only been taking her gabapentin.  She was also prescribed tramadol  and she takes it occasionally as well as occasional doses of Robaxin.  Patient states that she has worsening back pain but denies any leg numbness or weakness.  Patient also has worsening shortness of breath today.  Denies any chest pain.  Denies any fevers but has nonproductive cough.   The history is provided by the patient.       Prior to Admission medications  Medication Sig Start Date End Date Taking? Authorizing Provider  acetaminophen  (TYLENOL ) 500 MG tablet Take 1,000 mg by mouth every 6 (six) hours as needed for moderate pain (pain score 4-6) or mild pain (pain score 1-3).    [provider]  albuterol  (VENTOLIN  HFA) 108 (90 Base) MCG/ACT inhaler Inhale 2 puffs into the lungs every 4 (four) hours as needed for wheezing or shortness of breath. 04/12/23   [provider]  ALPRAZolam  (XANAX ) 0.5 MG tablet Take 0.25 mg by mouth 2 (two) times daily as needed for sleep or anxiety. 07/07/23   [provider]  apixaban  (ELIQUIS ) 5 MG TABS tablet Take 2 tablets (10 mg total) by mouth 2 (two) times daily for 4 days, THEN 1 tablet (5 mg total) 2 (two) times daily. 10/20/23 11/23/23  Regalado, Belkys A, MD  atorvastatin  (LIPITOR) 40 MG tablet Take 40 mg by mouth every evening.    [provider]  azithromycin  (ZITHROMAX ) 500 MG tablet Take 500 mg by mouth every Monday, Wednesday, and Friday.    [provider]  benzonatate (TESSALON) 100 MG capsule Take 100 mg by mouth 3 (three) times daily as  needed for cough. 06/13/23   [provider]  budesonide  (PULMICORT ) 0.5 MG/2ML nebulizer solution Take 0.5 mg by nebulization every 12 (twelve) hours.    [provider]  carvedilol  (COREG ) 6.25 MG tablet Take 1 tablet (6.25 mg total) by mouth 2 (two) times daily with a meal. 10/20/23   Regalado, Belkys A, MD  clopidogrel  (PLAVIX ) 75 MG tablet Take 75 mg by mouth in the morning.    [provider]  famotidine  (PEPCID ) 20 MG tablet Take 20 mg by mouth 2 (two) times daily as needed for heartburn. 06/01/23   [provider]  folic acid  (FOLVITE ) 1 MG tablet Take 1 mg by mouth daily. 03/13/22   [provider]  formoterol  (PERFOROMIST ) 20 MCG/2ML nebulizer solution Take 20 mcg by nebulization 2 (two) times daily.    [provider]  guaiFENesin  (MUCINEX ) 600 MG 12 hr tablet Take 2 tablets (1,200 mg total) by mouth 2 (two) times daily. 10/20/23   Regalado, Belkys A, MD  ipratropium-albuterol  (DUONEB) 0.5-2.5 (3) MG/3ML SOLN Take 3 mLs by nebulization See admin instructions. Nebulize 3 ml's and inhale into the lungs every 5 hours    [provider]  lisinopril  (ZESTRIL ) 5 MG tablet Take 5 mg by mouth every evening.    [provider]  nitroGLYCERIN  (NITROSTAT ) 0.4 MG SL tablet Place 0.4 mg under the tongue every 5 (five) minutes as needed  for chest pain. 04/23/23   [provider]  ondansetron  (ZOFRAN -ODT) 4 MG disintegrating tablet Take 1 tablet (4 mg total) by mouth every 8 (eight) hours as needed for up to 12 doses for nausea or vomiting. 12/31/23   Cottie Donnice PARAS, MD  OXYGEN Inhale 4 L/min into the lungs continuous.    [provider]  pantoprazole  (PROTONIX ) 40 MG tablet Take 1 tablet (40 mg total) by mouth daily. 10/20/23 10/19/24  Regalado, Belkys A, MD  polyethylene glycol (MIRALAX  / GLYCOLAX ) 17 g packet Take 17 g by mouth daily. 10/21/23   Regalado, Belkys A, MD  traMADol  (ULTRAM ) 50 MG tablet Take 1 tablet (50 mg  total) by mouth every 12 (twelve) hours as needed for up to 10 doses for severe pain (pain score 7-10). 12/31/23   Cottie Donnice PARAS, MD  YUPELRI  175 MCG/3ML nebulizer solution Take 175 mcg by nebulization in the morning.    [provider]    Allergies: Cefdinir, Hydrocodone -acetaminophen , and Hydromorphone    Review of Systems  Respiratory:  Positive for shortness of breath.   All other systems reviewed and are negative.   Updated Vital Signs BP (!) 160/100 (BP Location: Right Arm)   Pulse 87   Temp 98.8 F (37.1 C) (Oral)   Resp (!) 22   Ht 5' 4 (1.626 m)   Wt 45.4 kg   SpO2 93%   BMI 17.16 kg/m   Physical Exam Vitals and nursing note reviewed.  Constitutional:      Comments: Chronically ill and tachypneic  HENT:     Head: Normocephalic.     Mouth/Throat:     Mouth: Mucous membranes are moist.  Eyes:     Extraocular Movements: Extraocular movements intact.     Pupils: Pupils are equal, round, and reactive to light.  Cardiovascular:     Rate and Rhythm: Normal rate and regular rhythm.  Pulmonary:     Comments: Mild diffuse wheezing and no retractions Abdominal:     General: Bowel sounds are normal.     Palpations: Abdomen is soft.  Musculoskeletal:     Cervical back: Normal range of motion and neck supple.     Comments: Patient has mid thoracic tenderness but no obvious step-off  Skin:    General: Skin is warm.     Capillary Refill: Capillary refill takes less than 2 seconds.  Neurological:     General: No focal deficit present.     Mental Status: She is alert and oriented to person, place, and time.     Comments: Patient has no obvious saddle anesthesia.  Patient has normal strength bilateral legs.  Psychiatric:        Mood and Affect: Mood normal.        Behavior: Behavior normal.     (all labs ordered are listed, but only abnormal results are displayed) Labs Reviewed  RESP PANEL BY RT-PCR (RSV, FLU A&B, COVID)  RVPGX2  CBC WITH  DIFFERENTIAL/PLATELET  COMPREHENSIVE METABOLIC PANEL WITH GFR    EKG: None  Radiology: No results found.   Procedures   Medications Ordered in the ED  methylPREDNISolone  sodium succinate (SOLU-MEDROL ) 125 mg/2 mL injection 125 mg (has no administration in time range)  albuterol  (PROVENTIL ) (2.5 MG/3ML) 0.083% nebulizer solution 5 mg (has no administration in time range)  ipratropium (ATROVENT ) nebulizer solution 0.5 mg (has no administration in time range)  fentaNYL  (SUBLIMAZE ) injection 50 mcg (has no administration in time range)  Medical Decision Making Kristy Weeks is a 74 y.o. female presenting with back pain and shortness of breath.  Patient is on 4 L nasal cannula at baseline.  Likely COPD exacerbation.  Patient also has a known thoracic compression fracture diagnosed about a month ago.  Patient has not been taking her tramadol .  Patient has no lower extremity neurodeficits to suggest cauda equina syndrome.  Patient does not need any imaging for her spine right now.  Will give pain medicine and check labs and chest x-ray and reassess  10:59 PM Labs and chest x-ray pending.  Signed out to Dr. Hunter to reassess patient  Amount and/or Complexity of Data Reviewed Labs: ordered. Radiology: ordered.  Risk Prescription drug management.     Final diagnoses:  None    ED Discharge Orders     None          Patt Alm Macho, MD 02/07/24 2301  "

## 2024-02-07 NOTE — ED Triage Notes (Signed)
 Sx x few days. Worsening chronic mid upper back pain. Patient states that her home pain meds haven't been helping. Patient also reports increased SOB. Patient with labored breathing. Patient uses 4L O2 [place] at baseline for COPD.

## 2024-02-07 NOTE — ED Provider Notes (Signed)
 1:28 AM Assumed care from Dr. patt, please see their note for full history, physical and decision making until this point. In brief this is a 74 y.o. year old female who presented to the ED tonight with Shortness of Breath     Persistent back pain from a compression fracture last month.   Also sob w/ mild copd exacerbation, getting treatments. Dispo based on breathing/respiratory status.   Repeat evaluation, significantly diminished, tachypneic and mild wheezing. XR at baseline. VBG reassuring. Will give magnesium  and CAT and reassess.   Long discussion with her regarding better pain control. For now she prefers to stay with the medications she already has so will schedule the neurontin 100 TID rather than PRN, then use the tramadol  and/or oxycodone PRN. She has had multiple outpatient and ED visits regarding the back pain over the last few months and it is very intermittent. I don't see any reason to repeat any of this workup at this time.   Her hemoglobin has persistently dropped over the last few months (likely since starting eliquis ). She defers workup for this at this time. Hasn't noticed dark stools or brbpr, she will follow up with her PCP for recheck and further workup.   After her CAT she had significant improvement in her RR. Lungs without wheezing but signficantly diminished. Suspect as she feels close to baseline that this is likely her baseline lung sounds from severe emphysema. She feels ready for d/c and she appears stable for d/c as well.   Steroids and z pack sent to her pharmacy. Son to continue help at home.   Discharge instructions, including strict return precautions for new or worsening symptoms, given. Patient and/or family verbalized understanding and agreement with the plan as described.   Labs, studies and imaging reviewed by myself and considered in medical decision making if ordered. Imaging interpreted by radiology.  Labs Reviewed  CBC WITH DIFFERENTIAL/PLATELET -  Abnormal; Notable for the following components:      Result Value   RBC 3.11 (*)    Hemoglobin 8.6 (*)    HCT 29.4 (*)    MCHC 29.3 (*)    All other components within normal limits  COMPREHENSIVE METABOLIC PANEL WITH GFR - Abnormal; Notable for the following components:   CO2 34 (*)    Glucose, Bld 121 (*)    Total Protein 6.2 (*)    Alkaline Phosphatase 167 (*)    All other components within normal limits  I-STAT VENOUS BLOOD GAS, ED - Abnormal; Notable for the following components:   pH, Ven 7.446 (*)    pO2, Ven 70 (*)    Bicarbonate 38.0 (*)    TCO2 40 (*)    Acid-Base Excess 12.0 (*)    HCT 31.0 (*)    Hemoglobin 10.5 (*)    All other components within normal limits  RESP PANEL BY RT-PCR (RSV, FLU A&B, COVID)  RVPGX2    DG Chest Port 1 View  Final Result      No follow-ups on file.    Lorette Mayo, MD 02/08/24 [id number]

## 2024-02-08 MED ORDER — AZITHROMYCIN 250 MG PO TABS: 250.0000 mg | ORAL_TABLET | Freq: Every day | ORAL | 0 refills | Status: AC

## 2024-02-08 MED ORDER — PREDNISONE 20 MG PO TABS: ORAL_TABLET | ORAL | 0 refills | Status: AC

## 2024-02-08 NOTE — ED Notes (Signed)
 Pt notes her work of breathing is improved at this time however still experiencing some pain in her back.  Pt notes a readiness to go home at this point.  VSS will continue to monitor.

## 2024-02-14 ENCOUNTER — Encounter (HOSPITAL_BASED_OUTPATIENT_CLINIC_OR_DEPARTMENT_OTHER): Payer: Self-pay | Admitting: Emergency Medicine

## 2024-02-14 ENCOUNTER — Ambulatory Visit

## 2024-02-14 ENCOUNTER — Other Ambulatory Visit: Payer: Self-pay

## 2024-02-14 ENCOUNTER — Emergency Department (HOSPITAL_BASED_OUTPATIENT_CLINIC_OR_DEPARTMENT_OTHER)

## 2024-02-14 ENCOUNTER — Emergency Department (HOSPITAL_BASED_OUTPATIENT_CLINIC_OR_DEPARTMENT_OTHER)
Admission: EM | Admit: 2024-02-14 | Discharge: 2024-02-14 | Disposition: A | Discharge status: Home / Self Care | Attending: Emergency Medicine | Admitting: Emergency Medicine

## 2024-02-14 DIAGNOSIS — M25471 Effusion, right ankle: Secondary | ICD-10-CM | POA: Insufficient documentation

## 2024-02-14 DIAGNOSIS — Z7901 Long term (current) use of anticoagulants: Secondary | ICD-10-CM | POA: Insufficient documentation

## 2024-02-14 LAB — CBC WITH DIFFERENTIAL/PLATELET
Abs Immature Granulocytes: 0.05 K/uL (ref 0.00–0.07)
Basophils Absolute: 0 K/uL (ref 0.0–0.1)
Basophils Relative: 0 %
Eosinophils Absolute: 0 K/uL (ref 0.0–0.5)
Eosinophils Relative: 0 %
HCT: 30.4 % — ABNORMAL LOW (ref 36.0–46.0)
Hemoglobin: 8.8 g/dL — ABNORMAL LOW (ref 12.0–15.0)
Immature Granulocytes: 1 %
Lymphocytes Relative: 3 %
Lymphs Abs: 0.4 K/uL — ABNORMAL LOW (ref 0.7–4.0)
MCH: 27.5 pg (ref 26.0–34.0)
MCHC: 28.9 g/dL — ABNORMAL LOW (ref 30.0–36.0)
MCV: 95 fL (ref 80.0–100.0)
Monocytes Absolute: 0.2 K/uL (ref 0.1–1.0)
Monocytes Relative: 2 %
Neutro Abs: 9.9 K/uL — ABNORMAL HIGH (ref 1.7–7.7)
Neutrophils Relative %: 94 %
Platelets: 245 K/uL (ref 150–400)
RBC: 3.2 MIL/uL — ABNORMAL LOW (ref 3.87–5.11)
RDW: 13.3 % (ref 11.5–15.5)
WBC: 10.5 K/uL (ref 4.0–10.5)
nRBC: 0 % (ref 0.0–0.2)

## 2024-02-14 LAB — BASIC METABOLIC PANEL WITH GFR
Anion gap: 6 (ref 5–15)
BUN: 14 mg/dL (ref 8–23)
CO2: 37 mmol/L — ABNORMAL HIGH (ref 22–32)
Calcium: 9.6 mg/dL (ref 8.9–10.3)
Chloride: 98 mmol/L (ref 98–111)
Creatinine, Ser: 0.53 mg/dL (ref 0.44–1.00)
GFR, Estimated: >60 mL/min
Glucose, Bld: 175 mg/dL — ABNORMAL HIGH (ref 70–99)
Potassium: 3.9 mmol/L (ref 3.5–5.1)
Sodium: 142 mmol/L (ref 135–145)

## 2024-02-14 LAB — PRO BRAIN NATRIURETIC PEPTIDE: Pro Brain Natriuretic Peptide: 2781 pg/mL — ABNORMAL HIGH

## 2024-02-14 NOTE — Discharge Instructions (Signed)
 You have been seen and discharged from the emergency department.  The ultrasound of the leg showed no deep venous thrombosis.  There does not appear to be an infection of the right ankle or an issue with the blood flow.  The mild swelling could be just physiological and dependent.  Please use a gentle compression Ace wrap and elevate the foot as much as you can.  Follow-up with your primary provider for further evaluation and further care. Take home medications as prescribed. If you have any worsening symptoms, worsening swelling, redness, fevers, chest pain or shortness of breath or further concerns for your health please return to an emergency department for further evaluation.

## 2024-02-14 NOTE — ED Triage Notes (Signed)
 Pt reports intermittent R ankle swelling x1 week. Pt denies any CHF. Pt denies any injury or trauma to ankle. 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

## 2024-02-14 NOTE — ED Provider Notes (Signed)
 " [place] EMERGENCY DEPARTMENT AT [facility] Provider Note   CSN: [id number] Arrival date & time: 02/14/24  1526     Patient presents with: Joint Swelling   Kristy Weeks is a 74 y.o. female.   HPI   74 year old female presents emergency department intermittent right ankle swelling ongoing for the past week.  No history of CHF.  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  Denies any history of gout.  Denies any fever, numbness or foot discoloration.  No chest pain or shortness of breath.  Prior to Admission medications  Medication Sig Start Date End Date Taking? Authorizing Provider  acetaminophen  (TYLENOL ) 500 MG tablet Take 1,000 mg by mouth every 6 (six) hours as needed for moderate pain (pain score 4-6) or mild pain (pain score 1-3).    [provider]  albuterol  (VENTOLIN  HFA) 108 (90 Base) MCG/ACT inhaler Inhale 2 puffs into the lungs every 4 (four) hours as needed for wheezing or shortness of breath. 04/12/23   [provider]  ALPRAZolam  (XANAX ) 0.5 MG tablet Take 0.25 mg by mouth 2 (two) times daily as needed for sleep or anxiety. 07/07/23   [provider]  apixaban  (ELIQUIS ) 5 MG TABS tablet Take 2 tablets (10 mg total) by mouth 2 (two) times daily for 4 days, THEN 1 tablet (5 mg total) 2 (two) times daily. 10/20/23 11/23/23  Regalado, Belkys A, MD  atorvastatin  (LIPITOR) 40 MG tablet Take 40 mg by mouth every evening.    [provider]  azithromycin  (ZITHROMAX ) 250 MG tablet Take 1 tablet (250 mg total) by mouth daily. Take first 2 tablets together, then 1 every day until finished. 02/08/24   Mesner, Selinda, MD  benzonatate (TESSALON) 100 MG capsule Take 100 mg by mouth 3 (three) times daily as needed for cough. 06/13/23   [provider]  budesonide  (PULMICORT ) 0.5 MG/2ML nebulizer solution Take 0.5 mg by nebulization every 12 (twelve) hours.    [provider]  carvedilol  (COREG ) 6.25 MG tablet Take 1 tablet (6.25 mg total) by mouth 2 (two) times daily with a meal. 10/20/23   Regalado, Belkys A, MD  clopidogrel  (PLAVIX ) 75 MG tablet Take 75 mg by mouth in the morning.    [provider]  famotidine  (PEPCID ) 20 MG tablet Take 20 mg by mouth 2 (two) times daily as needed for heartburn. 06/01/23   [provider]  folic acid  (FOLVITE ) 1 MG tablet Take 1 mg by mouth daily. 03/13/22   [provider]  formoterol  (PERFOROMIST ) 20 MCG/2ML nebulizer solution Take 20 mcg by nebulization 2 (two) times daily.    [provider]  guaiFENesin  (MUCINEX ) 600 MG 12 hr tablet Take 2 tablets (1,200 mg total) by mouth 2 (two) times daily. 10/20/23   Regalado, Belkys A, MD  ipratropium-albuterol  (DUONEB) 0.5-2.5 (3) MG/3ML SOLN Take 3 mLs by nebulization See admin instructions. Nebulize 3 ml's and inhale into the lungs every 5 hours    [provider]  lisinopril  (ZESTRIL ) 5 MG tablet Take 5 mg by mouth every evening.    [provider]  nitroGLYCERIN  (NITROSTAT ) 0.4 MG SL tablet Place 0.4 mg under the tongue every 5 (five) minutes as needed for chest pain. 04/23/23   [provider]  ondansetron  (ZOFRAN -ODT) 4 MG disintegrating tablet Take 1 tablet (4 mg total) by mouth every 8 (eight) hours as needed for up to 12 doses for nausea  or vomiting. 12/31/23   Cottie Donnice PARAS, MD  OXYGEN Inhale 4 L/min into the lungs continuous.    [provider]  pantoprazole  (PROTONIX ) 40 MG tablet Take 1 tablet (40 mg total) by mouth daily. 10/20/23 10/19/24  Regalado, Belkys A, MD  polyethylene glycol (MIRALAX  / GLYCOLAX ) 17 g packet Take 17 g by mouth daily. 10/21/23   Regalado, Belkys A, MD  predniSONE  (DELTASONE ) 20 MG tablet 2 tabs po daily x 4 days 02/08/24   Mesner, Selinda, MD  traMADol  (ULTRAM ) 50 MG tablet Take 1 tablet (50 mg total) by mouth every 12 (twelve) hours as needed for up to 10 doses for severe pain  (pain score 7-10). 12/31/23   Cottie Donnice PARAS, MD  YUPELRI  175 MCG/3ML nebulizer solution Take 175 mcg by nebulization in the morning.    [provider]    Allergies: Cefdinir, Hydrocodone -acetaminophen , and Hydromorphone    Review of Systems  Constitutional:  Negative for fever.  Respiratory:  Negative for shortness of breath.   Cardiovascular:  Negative for chest pain, palpitations and leg swelling.  Gastrointestinal:  Negative for abdominal pain, diarrhea and vomiting.  Musculoskeletal:        + Right ankle swelling  Skin:  Negative for rash.  Neurological:  Negative for headaches.    Updated Vital Signs BP 138/81   Pulse 74   Temp 98.5 F (36.9 C)   Resp 15   Ht 5' 4 (1.626 m)   Wt 45.4 kg   SpO2 97%   BMI 17.16 kg/m   Physical Exam Vitals and nursing note reviewed.  Constitutional:      Appearance: Normal appearance.  HENT:     Head: Normocephalic.     Mouth/Throat:     Mouth: Mucous membranes are moist.  Cardiovascular:     Rate and Rhythm: Normal rate.  Pulmonary:     Effort: Pulmonary effort is normal. No respiratory distress.  Abdominal:     Palpations: Abdomen is soft.     Tenderness: There is no abdominal tenderness.  Musculoskeletal:     Comments: Very mild swelling of the right ankle with no overlying redness.  Equal DP pulses.  No foot edema or erythema.  No tracking cellulitis, no findings of gout.  Foot is neuro intact.  Calf is unremarkable.  Skin:    General: Skin is warm.  Neurological:     Mental Status: She is alert and oriented to person, place, and time. Mental status is at baseline.  Psychiatric:        Mood and Affect: Mood normal.     (all labs ordered are listed, but only abnormal results are displayed) Labs Reviewed  CBC WITH DIFFERENTIAL/PLATELET - Abnormal; Notable for the following components:      Result Value   RBC 3.20 (*)    Hemoglobin 8.8 (*)    HCT 30.4 (*)    MCHC 28.9 (*)    Neutro Abs 9.9 (*)    Lymphs  Abs 0.4 (*)    All other components within normal limits  BASIC METABOLIC PANEL WITH GFR - Abnormal; Notable for the following components:   CO2 37 (*)    Glucose, Bld 175 (*)    All other components within normal limits  PRO BRAIN NATRIURETIC PEPTIDE - Abnormal; Notable for the following components:   Pro Brain Natriuretic Peptide 2,781.0 (*)    All other components within normal limits    EKG: None  Radiology: US  Venous Img Lower Unilateral Right  Result Date: 02/14/2024 EXAM: ULTRASOUND DUPLEX OF THE RIGHT LOWER EXTREMITY VEINS TECHNIQUE: Duplex ultrasound using B-mode/gray scaled imaging and Doppler spectral analysis and color flow was obtained of the deep venous structures of the right lower extremity. COMPARISON: None available. CLINICAL HISTORY: Swelling Swelling swelling swelling FINDINGS: The common femoral vein, femoral vein, popliteal vein, and posterior tibial vein demonstrate normal compressibility with normal color flow and spectral analysis. IMPRESSION: 1. No evidence of DVT. Electronically signed by: Greig Pique MD 02/14/2024 05:51 PM EST RP Workstation: [id number]     Procedures   Medications Ordered in the ED - No data to display                                  Medical Decision Making Amount and/or Complexity of Data Reviewed Labs: ordered.   74 year old female presents emergency department intermittent right ankle swelling for the past week.  History of provoked PE of which she completed her oral anticoagulation for.  No chest pain or shortness of breath.  Ankle swelling is improved with elevation and supportive treatment.  Vitals are normal and stable.  Right ankle has very mild swelling of the joint when compared to the left but is otherwise neurovascularly intact and unremarkable.  DVT ultrasound is negative.  Blood work is reassuring.  Does not appear to be infected or related to gout.  proBNP was elevated but this is nonspecific.  With the swelling being  unilateral I have lower suspicion for CHF at this time.  Will not consider diuresis as it is responding to supportive measures.  Will recommend compressive therapy, elevation and outpatient follow-up.  Patient at this time appears safe and stable for discharge and close outpatient follow up. Discharge plan and strict return to ED precautions discussed, patient verbalizes understanding and agreement.     Final diagnoses:  Ankle swelling, right    ED Discharge Orders     None          Bari Roxie HERO, DO 02/14/24 1835  "
